# Patient Record
Sex: Female | Born: 1975 | Race: Black or African American | Hispanic: No | Marital: Married | Smoking: Never smoker
Health system: Southern US, Community
[De-identification: ages and names within clinical notes are randomized; demographics above are authoritative.]

## PROBLEM LIST (undated history)

## (undated) DIAGNOSIS — G43909 Migraine, unspecified, not intractable, without status migrainosus: Secondary | ICD-10-CM

## (undated) DIAGNOSIS — I10 Essential (primary) hypertension: Secondary | ICD-10-CM

## (undated) DIAGNOSIS — M25511 Pain in right shoulder: Secondary | ICD-10-CM

## (undated) DIAGNOSIS — F419 Anxiety disorder, unspecified: Secondary | ICD-10-CM

## (undated) DIAGNOSIS — T7840XA Allergy, unspecified, initial encounter: Secondary | ICD-10-CM

## (undated) DIAGNOSIS — D509 Iron deficiency anemia, unspecified: Secondary | ICD-10-CM

## (undated) DIAGNOSIS — Z5189 Encounter for other specified aftercare: Secondary | ICD-10-CM

## (undated) DIAGNOSIS — G8929 Other chronic pain: Secondary | ICD-10-CM

## (undated) DIAGNOSIS — Z8669 Personal history of other diseases of the nervous system and sense organs: Secondary | ICD-10-CM

## (undated) HISTORY — DX: Essential (primary) hypertension: I10

## (undated) HISTORY — DX: Allergy, unspecified, initial encounter: T78.40XA

## (undated) HISTORY — DX: Encounter for other specified aftercare: Z51.89

---

## 1998-08-31 ENCOUNTER — Emergency Department (HOSPITAL_COMMUNITY): Admission: EM | Admit: 1998-08-31 | Discharge: 1998-08-31 | Payer: Self-pay | Admitting: Emergency Medicine

## 1999-01-26 ENCOUNTER — Ambulatory Visit (HOSPITAL_COMMUNITY): Admission: RE | Admit: 1999-01-26 | Discharge: 1999-01-26 | Payer: Self-pay | Admitting: Obstetrics

## 1999-08-17 ENCOUNTER — Encounter (HOSPITAL_COMMUNITY): Admission: RE | Admit: 1999-08-17 | Discharge: 1999-08-27 | Payer: Self-pay | Admitting: Obstetrics

## 1999-08-26 ENCOUNTER — Inpatient Hospital Stay (HOSPITAL_COMMUNITY): Admission: AD | Admit: 1999-08-26 | Discharge: 1999-08-29 | Payer: Self-pay | Admitting: Obstetrics

## 1999-08-26 ENCOUNTER — Encounter: Payer: Self-pay | Admitting: Obstetrics

## 1999-08-26 ENCOUNTER — Encounter (INDEPENDENT_AMBULATORY_CARE_PROVIDER_SITE_OTHER): Payer: Self-pay

## 1999-08-29 ENCOUNTER — Encounter (HOSPITAL_COMMUNITY): Admission: RE | Admit: 1999-08-29 | Discharge: 1999-11-27 | Payer: Self-pay | Admitting: Obstetrics

## 1999-11-29 ENCOUNTER — Encounter (HOSPITAL_COMMUNITY): Admission: RE | Admit: 1999-11-29 | Discharge: 2000-02-22 | Payer: Self-pay | Admitting: Obstetrics

## 2000-08-03 ENCOUNTER — Other Ambulatory Visit: Admission: RE | Admit: 2000-08-03 | Discharge: 2000-08-03 | Payer: Self-pay | Admitting: Obstetrics

## 2000-12-06 HISTORY — PX: TUBAL LIGATION: SHX77

## 2001-01-27 ENCOUNTER — Encounter: Payer: Self-pay | Admitting: Obstetrics

## 2001-01-27 ENCOUNTER — Ambulatory Visit (HOSPITAL_COMMUNITY): Admission: RE | Admit: 2001-01-27 | Discharge: 2001-01-27 | Payer: Self-pay | Admitting: Obstetrics

## 2001-01-30 ENCOUNTER — Encounter (HOSPITAL_COMMUNITY): Admission: RE | Admit: 2001-01-30 | Discharge: 2001-02-14 | Payer: Self-pay | Admitting: Obstetrics

## 2001-02-13 ENCOUNTER — Encounter (INDEPENDENT_AMBULATORY_CARE_PROVIDER_SITE_OTHER): Payer: Self-pay

## 2001-02-13 ENCOUNTER — Encounter (INDEPENDENT_AMBULATORY_CARE_PROVIDER_SITE_OTHER): Payer: Self-pay | Admitting: Specialist

## 2001-02-13 ENCOUNTER — Inpatient Hospital Stay (HOSPITAL_COMMUNITY): Admission: AD | Admit: 2001-02-13 | Discharge: 2001-02-15 | Payer: Self-pay | Admitting: Obstetrics

## 2002-10-22 ENCOUNTER — Encounter: Payer: Self-pay | Admitting: Emergency Medicine

## 2002-10-22 ENCOUNTER — Emergency Department (HOSPITAL_COMMUNITY): Admission: EM | Admit: 2002-10-22 | Discharge: 2002-10-23 | Payer: Self-pay | Admitting: Emergency Medicine

## 2003-12-07 HISTORY — PX: GASTRIC OUTLET OBSTRUCTION RELEASE: SHX5247

## 2003-12-07 HISTORY — PX: GASTRIC BYPASS: SHX52

## 2004-06-29 ENCOUNTER — Ambulatory Visit (HOSPITAL_COMMUNITY): Admission: RE | Admit: 2004-06-29 | Discharge: 2004-06-29 | Payer: Self-pay | Admitting: *Deleted

## 2004-07-03 ENCOUNTER — Ambulatory Visit (HOSPITAL_COMMUNITY): Admission: RE | Admit: 2004-07-03 | Discharge: 2004-07-03 | Payer: Self-pay | Admitting: *Deleted

## 2004-07-06 ENCOUNTER — Encounter: Admission: RE | Admit: 2004-07-06 | Discharge: 2004-07-06 | Payer: Self-pay | Admitting: *Deleted

## 2004-07-14 ENCOUNTER — Encounter: Admission: RE | Admit: 2004-07-14 | Discharge: 2004-10-12 | Payer: Self-pay | Admitting: *Deleted

## 2004-09-08 ENCOUNTER — Inpatient Hospital Stay (HOSPITAL_COMMUNITY): Admission: RE | Admit: 2004-09-08 | Discharge: 2004-09-13 | Payer: Self-pay | Admitting: *Deleted

## 2004-11-04 ENCOUNTER — Encounter: Admission: RE | Admit: 2004-11-04 | Discharge: 2004-11-04 | Payer: Self-pay | Admitting: *Deleted

## 2005-02-12 ENCOUNTER — Encounter: Admission: RE | Admit: 2005-02-12 | Discharge: 2005-05-13 | Payer: Self-pay | Admitting: *Deleted

## 2005-08-30 IMAGING — CR DG ABDOMEN ACUTE W/ 1V CHEST
5 series · 5 of 5 positions shown · non-contrast
Comparison: 09/09/04.

CLINICAL DATA: 28-year-old female, morbid obesity with nausea and vomiting.  Status-post gastric bypass.  
 ACUTE ABDOMINAL SERIES, 09/10/04

[view not recorded (1 of 5)]
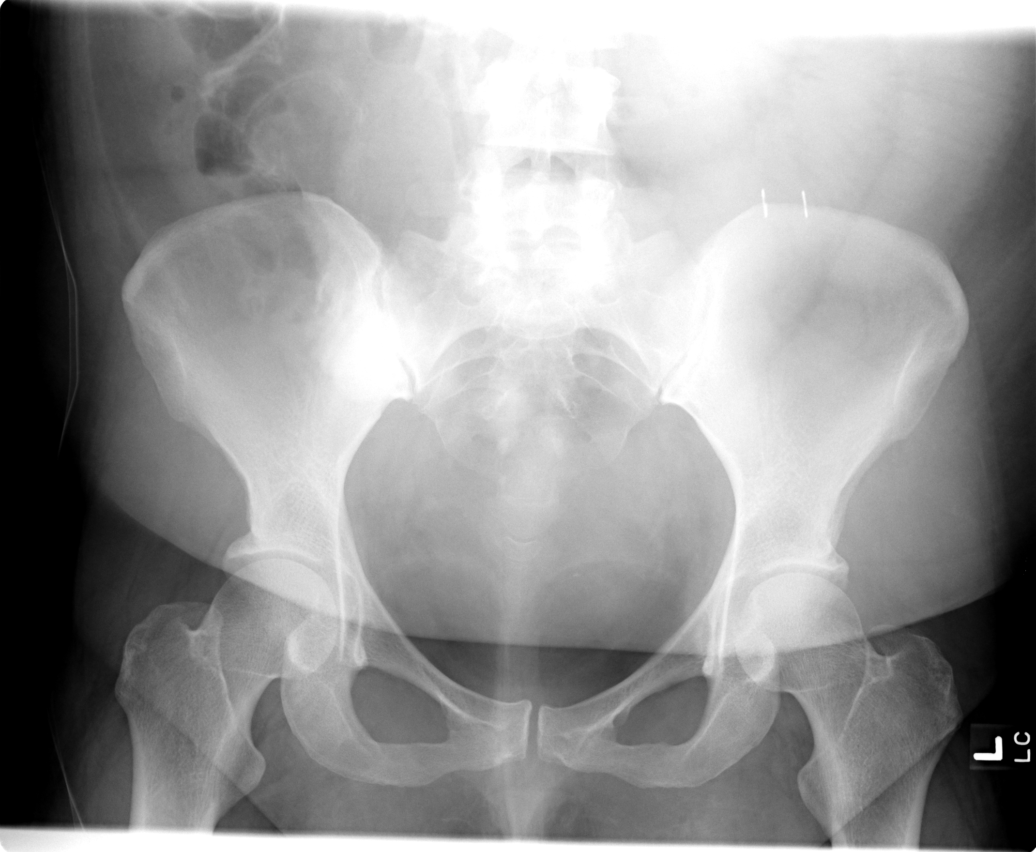

[view not recorded (2 of 5)]
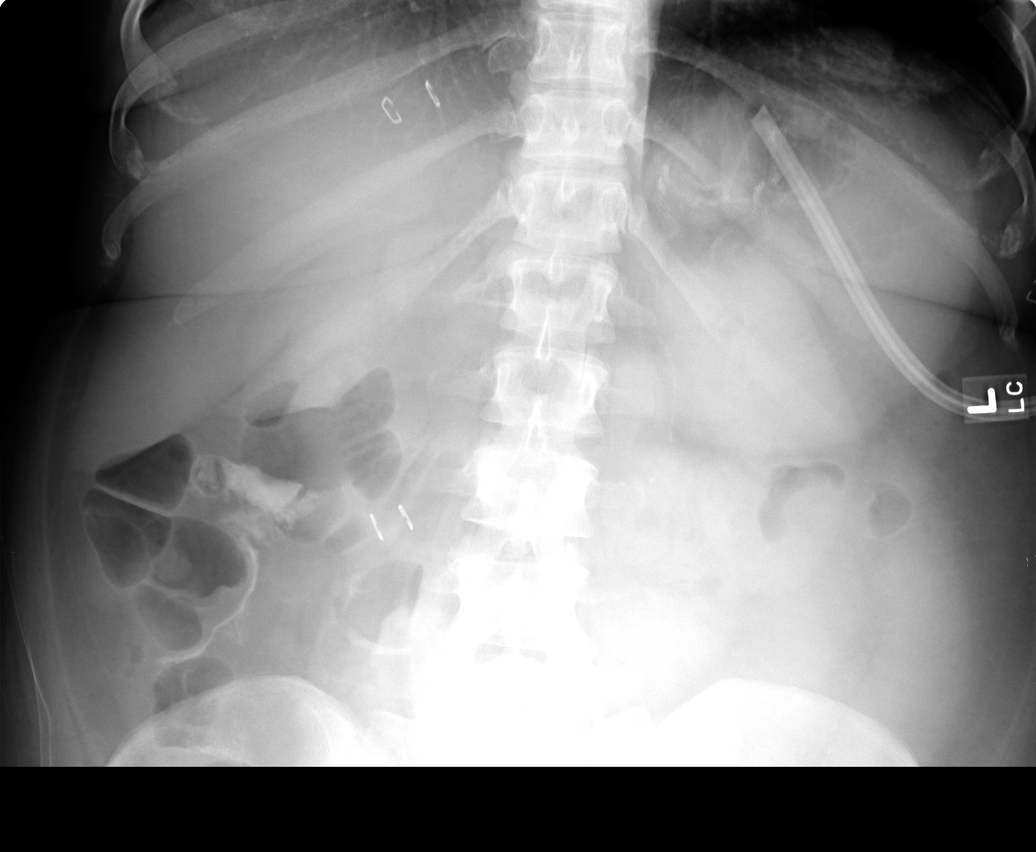

[view not recorded (3 of 5)]
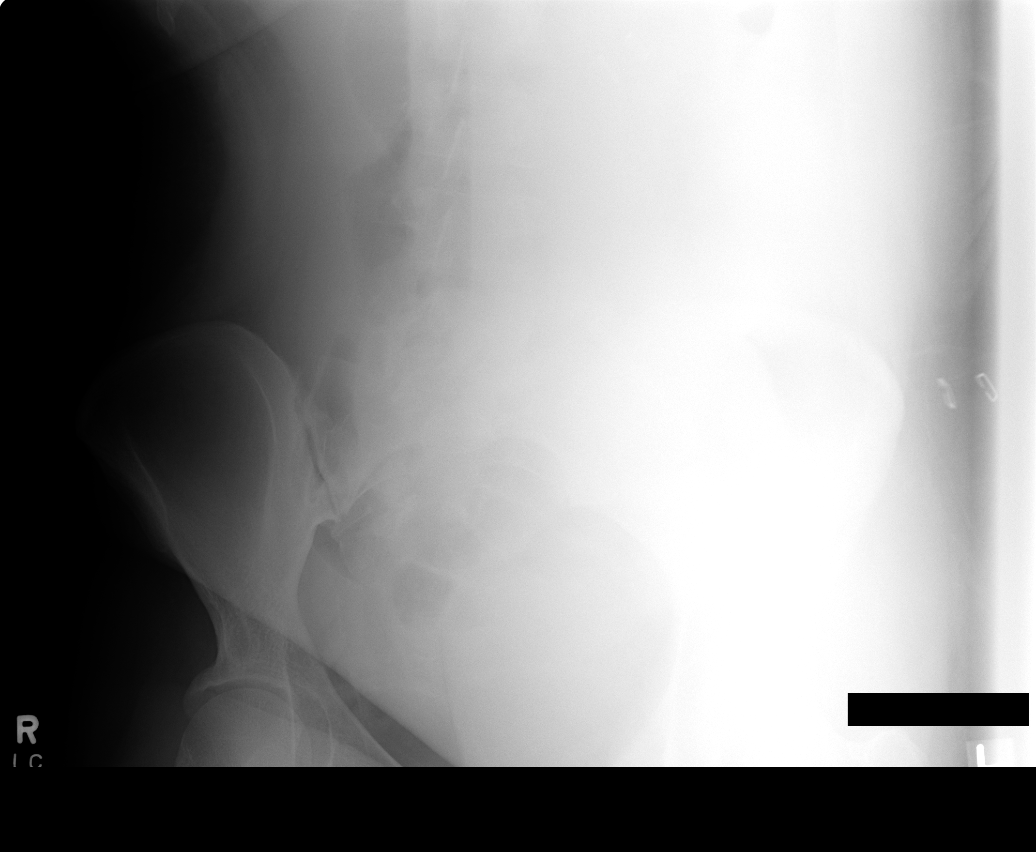

[view not recorded (4 of 5)]
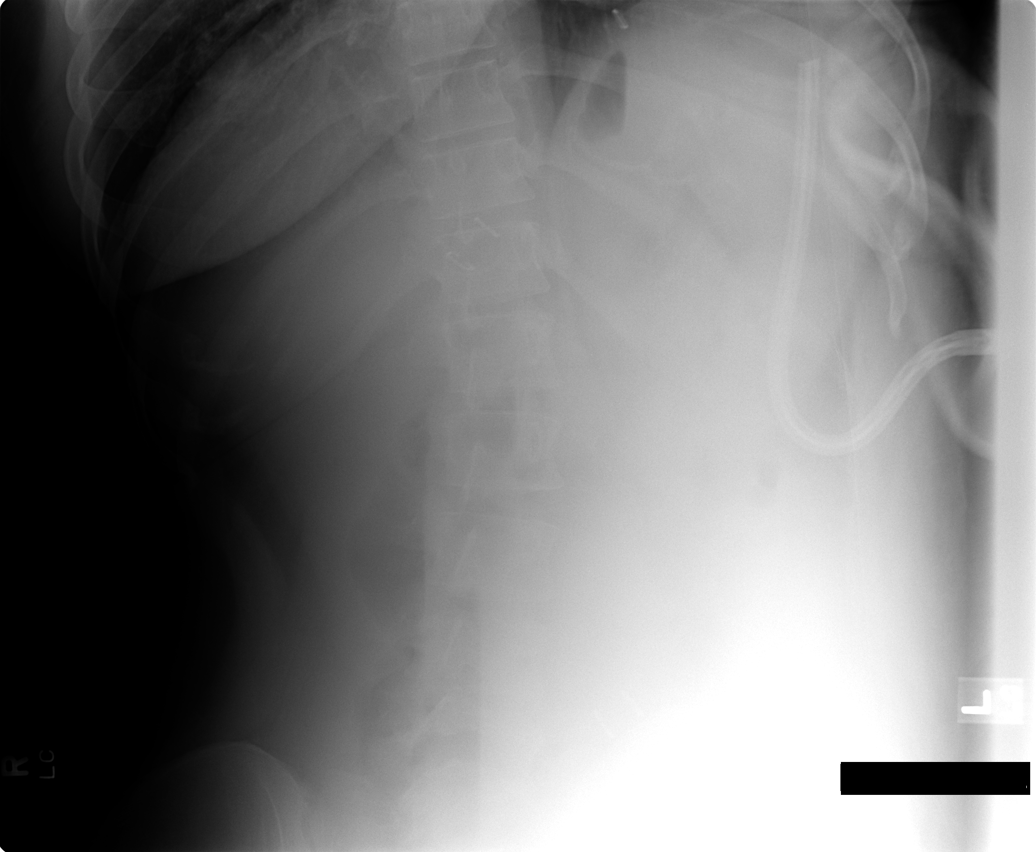

[view not recorded (5 of 5)]
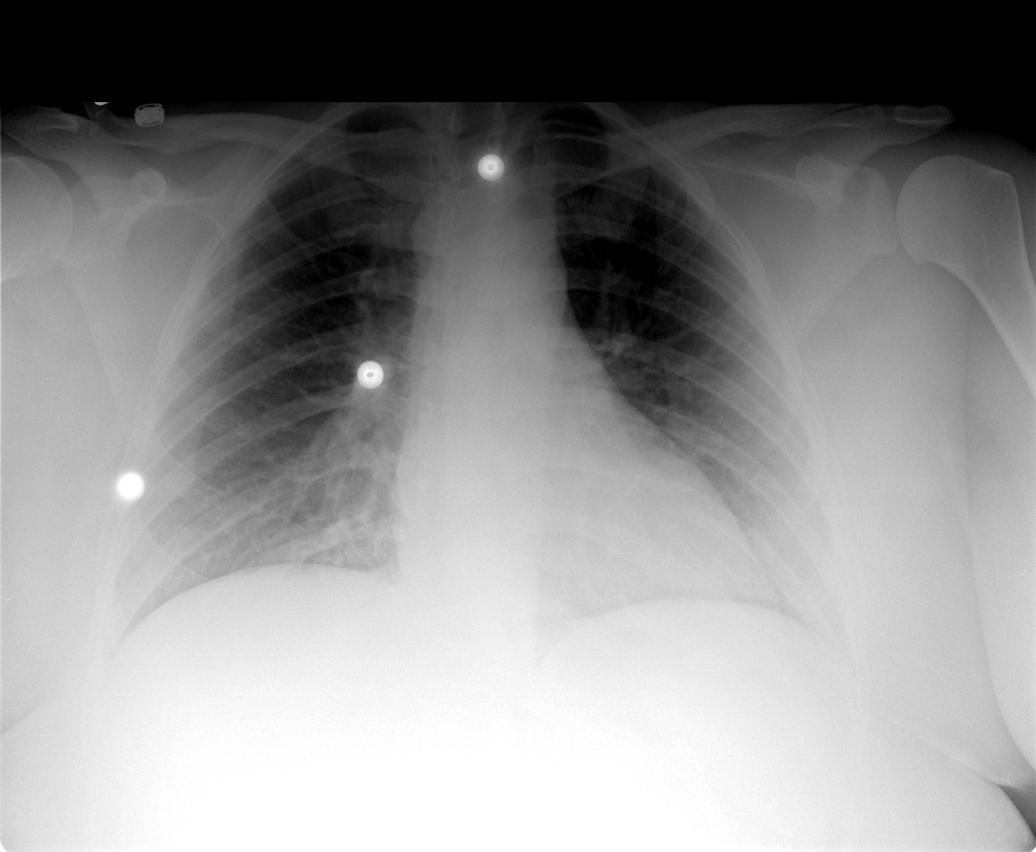

[5 of 5 positions shown; findings below may reference images not displayed]

FINDINGS: Lungs are clear.  No acute pneumonia, atelectasis, consolidation, effusion, or pneumothorax.  Heart size is normal.  No free air.  Surgical drain is evident in the left upper quadrant.  Postoperative change is evident in the left upper quadrant.  Staples are noted at the laparoscopy ports.  Scattered air and fluid is evident throughout the bowel.  On the decubitus view, there are a few scattered air-fluid levels.  Findings suggest residual ileus.  
 IMPRESSION
 No active chest disease. 
 Postoperative changes from recent gastric bypass. 
 Fluid distended small bowel with air-fluid levels.  Air and contrast are evident in the colon.  Findings suggest residual ileus.

## 2008-06-21 ENCOUNTER — Emergency Department (HOSPITAL_COMMUNITY): Admission: EM | Admit: 2008-06-21 | Discharge: 2008-06-21 | Payer: Self-pay | Admitting: Emergency Medicine

## 2008-08-09 ENCOUNTER — Emergency Department (HOSPITAL_COMMUNITY): Admission: EM | Admit: 2008-08-09 | Discharge: 2008-08-09 | Payer: Self-pay | Admitting: Emergency Medicine

## 2011-04-23 NOTE — Op Note (Signed)
[facility]  Patient:    Tammy [name], Tammy [name]                     MRN: [id number] Proc. Date: 02/14/01 Adm. Date:  [id number] Disc. Date: [id number] Attending:  Venita Sheffield                           Operative Report  PREOPERATIVE DIAGNOSIS:       Multiparity, desires sterilization.  PROCEDURE:                    Postpartum tubal.  SURGEON:                      Kathreen Cosier, M.D.  DESCRIPTION OF PROCEDURE:     Using epidural and with the patient in the supine position, the abdomen was prepped and draped.  The bladder was emptied with a straight catheter.  A midline subumbilical incision 1 inc h long was made and carried down to the fascia.  The fascia was cleaned and grasped with two Kochers.  The fascia and the peritoneum was opened with Mayo scissors. The left tube was grasped in the mid portion with a Babcock clamp and the tube traced to the fimbria.  Suture of 0 plain was placed in the mesosalpinx.  The resultant portion of tube within the clamp was tied on both sides and approximately 1 inch of tube transected.  Hemostasis was satisfactory.  The procedure was repeated in a similar fashion on the other side.  Lap and sponge counts were correct.  The abdomen was closed in layers, the peritoneum and fascia with continuous 2-0 Dexon.  The skin was closed with subcuticular sutures of 3-0 plain.  The patient tolerated the procedure well and returned to the recovery room in good condition. DD:  02/14/01 TD:  02/15/01 Job: [id number]

## 2011-04-23 NOTE — Discharge Summary (Signed)
[facility]  Patient:    Tammy [name], Tammy [name]                     MRN: [id number] Adm. Date:  02/13/01 Attending:  Kathreen Cosier, M.D.                           Discharge Summary  HISTORY OF PRESENT ILLNESS:   Patient is a 35 year old gravida 5, para 3-1-0-4, [facility] February 20, 2001, positive GBS patient, had nonstress test from 36 weeks twice a week because of chronic hypertension.  On the day of admission, her diastolics were between 93 and 108.  Her blood pressure was under good control with Aldomet 500 t.i.d.  She was brought in for induction of labor.  HOSPITAL COURSE:              At the time of admission, her cervix 3 cm, 70%, vertex, and -3.  She had a normal vaginal delivery of a female, Apgars 8 and 9, weighing 6 pounds 11 ounces.  Patient desired sterilization and underwent postpartum tubal ligation on February 14, 2001.  On admission, her hemoglobin was 12.9, platelets 370; post delivery 12.6 and 3.5.  Sodium 136, potassium 3.9, and chloride 110.  Her PIH labs were normal.  Urine protein was negative. Patient was discharged home on the second postpartum day and will return to regular diet, on Aldomet 500 three times a day, Tylenol No. 3 one q.4h. p.r.n. She is to see Dr. Allyne Gee in one week about her blood pressure control and medication.  DISCHARGE DIAGNOSES:          1. Induction of labor at 39 weeks because of                                  chronic hypertension.                               2. Postpartum tubal ligation performed. DD:  02/15/01 TD:  02/15/01 Job: [id number]

## 2011-04-23 NOTE — Op Note (Signed)
NAMELYNZEE, LINDQUIST            ACCOUNT NO.:  [id number]   MEDICAL RECORD NO.:  [id number]          PATIENT TYPE:  INP   LOCATION:  0474                         FACILITY:  [facility]   PHYSICIAN:  Sandria Bales. Ezzard Standing, M.D.  DATE OF BIRTH:  1976/09/03   DATE OF PROCEDURE:  DATE OF DISCHARGE:                                 OPERATIVE REPORT   PROCEDURE:  Upper endoscopy with decompression of small bowel.   SURGEON:  Sandria Bales. Ezzard Standing, M.D.   ANESTHESIA:  General endotracheal.   ESTIMATED BLOOD LOSS:  None.   INDICATIONS FOR PROCEDURE:  Ms. Zumstein is a 35 year old female who is two  days status post laparoscopic Roux-en-Y gastrojejunostomy.  She has  developed vomiting.  A CT scan today was suggestive of a small bowel  obstruction at her jejunal anastomosis.   I am endoscoping the patient, trying to decompress her gastric pouch while  Dr. Lind Guest the laparoscope, and we will both occlude the small bowel  and feed the small bowel contents to me.   Operative Note:  After passing the endoscope without difficulty down her esophagus, again,  getting to the GE junction at about 38-40 cm into the stomach pouch.  I did  not want to insufflate much air, although the stomach mucosa looked viable  and pink, though mildly hemorrhagic.  I decompressed about 550 cc of succus  entericus out as Dr. Daphine Deutscher fed the fluid back to me.  I then aspirated the  esophagus, which looked okay.   The patient tolerated the procedure well.  Dr. Daphine Deutscher will dictate the  laparoscopic portion of the operation.     Davi   DHN/MEDQ  D:  09/10/2004  T:  09/10/2004  Job:  [id number]   cc:   Vikki Ports, MD  [address]  [place]  [place] [address]  Fax: [phone]

## 2011-04-23 NOTE — Op Note (Signed)
[name], [name]            ACCOUNT NO.:  [id number]   MEDICAL RECORD NO.:  [id number]          PATIENT TYPE:  INP   LOCATION:  0001                         FACILITY:  [facility]   PHYSICIAN:  Vikki Ports, M.D.DATE OF BIRTH:  1976/01/25   DATE OF PROCEDURE:  09/08/2004  DATE OF DISCHARGE:                                 OPERATIVE REPORT   PREOPERATIVE DIAGNOSES:  Morbid obesity.   POSTOPERATIVE DIAGNOSES:  Morbid obesity.   PROCEDURE:  Laparoscopic roux-en-Y gastric bypass, antecolic, antegastric.   SURGEON:  Vikki Ports, M.D.   ASSISTANT:  Sandria Bales. Ezzard Standing, M.D.   ANESTHESIA:  General.   DESCRIPTION OF PROCEDURE:  The patient was taken to the operating room,  placed in the supine position.  After adequate general anesthesia was  induced using endotracheal tube and Foley catheter was placed, the abdomen  was prepped and draped in the normal sterile fashion. Using a 12 mm  Optiview, pneumoperitoneum was obtained through the left upper quadrant.  Additional 12 mm trocars were placed in the right upper quadrant in the left  paramedian position. A 5 mm trocar was placed in the left anterior axillary  line. The ligament of Treitz was identified and the small bowel was divided  using an auto suture GIA stapling device 40 cm distal to the ligament of  Treitz. The distal end was tagged with a Penrose drain and counting down an  additional 100 cm, a side to side jejunojejunostomy was performed in the  standard fashion using a harmonic scalpel and a 45 mm white load GIA  stapling device. The defect was closed with running 2-0 Vicryl suture and  secured with Tisseel. The mesenteric defect was closed with a running 2-0  silk suture. The [facility] liver retractor was then placed and the left  lateral segment of the liver was retracted. The angle of His was sharply and  bluntly dissected. An area on the lesser curve approximately 4 cm distal to  the EG junction was  identified and the lesser sac was entered.  Using four  consecutive firings of a blue load GIA stapling device, the pouch was  created. The patency of the EG junction was verified by placement of an  Ewald tube. After complete division of the stomach, the staple line of the  remnant was oversewn with a running 2-0 silk suture. A side to side  gastrojejunostomy to the roux limb was then performed in a standard fashion  using a running posterior 2-0 Vicryl. A 45 mm GIA stapling device. The  defect was closed with a running 2-0 Vicryl suture and anterior serosal  layer was also performed. This was accomplished over an Ewald tube. The  Ewald tube was then removed, roux limb was clamped, upper endoscopy was  performed by Dr. Ovidio Kin which showed patency of the anastomosis and no  evidence of leak.  A drain was placed near the EG junction, pneumoperitoneum  was released, trocars were removed, retractor was removed, incisions were  closed with staples. Sterile dressings were applied. The patient tolerated  the procedure well and went to PACU in good  condition.    KRH/MEDQ  D:  09/08/2004  T:  09/08/2004  Job:  [id number]

## 2011-04-23 NOTE — Discharge Summary (Signed)
[name], Tammy [name]            ACCOUNT NO.:  [id number]   MEDICAL RECORD NO.:  [id number]          PATIENT TYPE:  INP   LOCATION:  0472                         FACILITY:  [facility]   PHYSICIAN:  Vikki Ports, MDDATE OF BIRTH:  [birth year]   DATE OF ADMISSION:  09/08/2004  DATE OF DISCHARGE:  09/13/2004                                 DISCHARGE SUMMARY   ADMISSION DIAGNOSIS:  Morbid obesity.   DISCHARGE DIAGNOSIS:  Morbid obesity.   CONDITION ON DISCHARGE:  Good and improved.   DISPOSITION:  Discharge to home.  Follow up with me in one week.   BRIEF HISTORY:  Patient is a 35 year old black female who after home bowel  prep is admitted for laparoscopic Roux-en-Y gastric bypass.   HOSPITAL COURSE:  Patient was admitted.  Underwent laparoscopic Roux-en-Y  gastric bypass.  She was watched on the floor without complaints.  On  postoperative day #1, the upper GI showed some dilatation of the Roux limb.   On postoperative day #2, patient was having some significant nausea and was  followed; however, later in the day, she had a CT scan because of the  air/fluid levels on her upper GI, and this showed a distended duodenum and  gastric remnant.  Patient was taken to the operating room that evening by  dr. Daphine Deutscher and Dr. Ezzard Standing and had laparoscopic decompression of the small  bowel and jejunojunopexy.   Over the next two days, her diet was slowly advanced.  By postoperative day  #5, she was tolerating protein liquids and was discharged home.     Tammy [name]   KRH/MEDQ  D:  10/02/2004  T:  10/02/2004  Job:  [id number]

## 2011-04-23 NOTE — Op Note (Signed)
NAMEGLEMA, TAKAKI            ACCOUNT NO.:  [id number]   MEDICAL RECORD NO.:  [id number]          PATIENT TYPE:  INP   LOCATION:  0474                         FACILITY:  [facility]   PHYSICIAN:  Thornton Park. Daphine Deutscher, M.D.DATE OF BIRTH:  May 01, 1976   DATE OF PROCEDURE:  09/10/2004  DATE OF DISCHARGE:                                 OPERATIVE REPORT   PREOPERATIVE DIAGNOSIS:  Mechanical small bowel obstruction with persistent  nausea and vomiting.   POSTOPERATIVE DIAGNOSIS:  Angulation of the jejunojejunostomy with partial  small bowel obstruction.   PROCEDURE:  Laparoscopic decompression of bowel obstruction and  jejunojejunopexy with upper endoscopy by Dr. Ezzard Standing.   SURGEON:  Thornton Park. Daphine Deutscher, M.D.   ASSISTANT:  Sandria Bales. Ezzard Standing, M.D.   ANESTHESIA:  General endotracheal anesthesia.   The previously placed Jackson-Pratt was left in place.   DESCRIPTION OF PROCEDURE:  Tammy [name] is a 35 year old female who  underwent a laparoscopic Roux-en-Y gastric bypass on September 08, 2004.  Approximately 18 hours ago, she had some nausea and vomiting treated with  antiemetics.  Her x-rays showed some evidence of possibly an ileus, and a CT  scan today showed some dilated loops of small bowel proximally, including  the gastric remnant and the Roux distended.  We felt that urgent laparoscopy  was in order and brought her to the operating room.  I discussed this with  her preop.   She was endotracheally intubated, and the abdomen was prepped with Betadine  and draped sterilely.  Her staples were removed.  I prepped her J-P out and  left it in place.  I then opened two of the trocar holes with my finger and  guided into the abdomen with a Optiview technique, I used the pre-discovered  holes and entered the abdomen without difficulty and insufflated the  abdomen.  Another trocar was placed, then a fresh 1011 was placed in the  right lower quadrant.  With this insufflated, we went up and  looked in the  left upper quadrant and found the jejunojejunostomy had markedly dilated  biliopancreatic limb and the Roux-en-Y limb.  Distally, the bowel was  decompressed.  By manipulating this and shifting the Roux-en-Y limb in a  cephalad position, it immediately began decompressing into the small bowel  distally.  It appeared that there was angulation there.  We then spent time  and allowed this to decompress distally, then Dr. Ezzard Standing went up with the  endoscope, and I gently allowed contents to flow in a retrograde fashion.  I  got about 450-500 cc of greenish fluid.   We studied the geometry of this anastomosis and looked at possible distal  suturing but felt that a proximal pexy of the jejunum to the biliopancreatic  limb would straighten out the angulation that was occurring with the Roux  limb twisting downward; therefore, I went back above the stapled  anastomosis, at 2-3 cm, and got a good purchase of the antimesenteric border  of the Roux-en-Y limb and sutured that upstream to a portion of the  biliopancreatic limb and tied this down.  This seemed to reduce the  angulation of the anastomosis.  The abdomen was aspirated.  There was serous  fluid of an ascitic-type nature.  The bowel remained looking very viable.  There was no evidence of any perforation, and we did not feel that we had  any kind of untoward bowel injury, as we were very carefully  manipulating the bowel.  We tried to videotape this, but the OR staff was  unable to get the video to work.  We did take some still pictures.  Patient  tolerated the procedure well.  She will be taken to the recovery room and  then go to the intensive care unit for observation in the immediate postop  period.      MBM/MEDQ  D:  09/10/2004  T:  09/10/2004  Job:  [id number]   cc:   Vikki Ports, M.D.  [address]  [place]  [place] [address]  Fax: [phone]

## 2011-04-23 NOTE — Op Note (Signed)
Tammy [name], BORUNDA            ACCOUNT NO.:  [id number]   MEDICAL RECORD NO.:  [id number]          PATIENT TYPE:  INP   LOCATION:  0001                         FACILITY:  [facility]   PHYSICIAN:  Sandria Bales. Ezzard Standing, M.D.  DATE OF BIRTH:  1976/09/15   DATE OF PROCEDURE:  09/08/2004  DATE OF DISCHARGE:                                 OPERATIVE REPORT   PREOPERATIVE DIAGNOSIS:  Morbid obesity status post laparoscopic Roux-Y  gastric gastrojejunostomy.   POSTOPERATIVE DIAGNOSIS:  Morbid obesity status post laparoscopic Roux-Y  gastric gastrojejunostomy with patent anastomosis and no evidence of leak.   PROCEDURE:  Esophagogastroscopy.   SURGEON:  Sandria Bales. Ezzard Standing, M.D.   ANESTHESIA:  General.   INDICATIONS FOR PROCEDURE:  Ms. Daughtrey is a 35 year old white female who  is morbidly obese, with a BMI of approximately 41.  She has undergone a  laparoscopic Roux-Y gastrojejunostomy by Dr. Danna Hefty.  I am now  doing an upper endoscopy for the purposes of identifying the anastomosis and  to make sure it is patent and without any leak and no evidence of bleeding.   DESCRIPTION OF PROCEDURE:  The patient was placed in a supine position under  general anesthesia.  Dr. Luan Pulling was manning the laparoscope and was  flooding the upper abdomen, placing the anastomosis under water.  I passed  the flexible Olympus endoscope without difficulty down to the GE junction.  I identified the anastomosis at approximately 45 cm.  It was widely patent.  I insufflated the stomach while she had the stomach under water, and there  was no air bubbling or evidence of leak.  There was no evidence of bleeding  from the staple line.  The stomach pouch otherwise looked good.  The  esophagogastric junction was at approximately 38 cm with a 7-cm pouch.  The  esophagus was otherwise unremarkable.   The patient tolerated the procedure well.  I then withdrew the scope.  Dr.  Luan Pulling will dictate the  laparoscopic Roux-Y gastrojejunostomy.     Davi   DHN/MEDQ  D:  09/08/2004  T:  09/08/2004  Job:  [id number]   cc:   Vikki Ports, MD  [address]  [place]  [place] [address]  Fax: [phone]

## 2011-09-08 LAB — STREP A DNA PROBE: Group A Strep Probe: NEGATIVE

## 2011-09-08 LAB — RAPID STREP SCREEN (MED CTR MEBANE ONLY): Streptococcus, Group A Screen (Direct): NEGATIVE

## 2012-07-23 ENCOUNTER — Ambulatory Visit (INDEPENDENT_AMBULATORY_CARE_PROVIDER_SITE_OTHER): Payer: Federal, State, Local not specified - PPO | Admitting: Internal Medicine

## 2012-07-23 VITALS — BP 142/80 | HR 88 | Temp 98.4°F | Resp 16 | Ht 65.5 in | Wt 153.0 lb

## 2012-07-23 DIAGNOSIS — R209 Unspecified disturbances of skin sensation: Secondary | ICD-10-CM

## 2012-07-23 DIAGNOSIS — M519 Unspecified thoracic, thoracolumbar and lumbosacral intervertebral disc disorder: Secondary | ICD-10-CM

## 2012-07-23 DIAGNOSIS — G4700 Insomnia, unspecified: Secondary | ICD-10-CM

## 2012-07-23 DIAGNOSIS — M5136 Other intervertebral disc degeneration, lumbar region: Secondary | ICD-10-CM | POA: Insufficient documentation

## 2012-07-23 DIAGNOSIS — M5137 Other intervertebral disc degeneration, lumbosacral region: Secondary | ICD-10-CM | POA: Insufficient documentation

## 2012-07-23 DIAGNOSIS — M51379 Other intervertebral disc degeneration, lumbosacral region without mention of lumbar back pain or lower extremity pain: Secondary | ICD-10-CM | POA: Insufficient documentation

## 2012-07-23 DIAGNOSIS — Z9884 Bariatric surgery status: Secondary | ICD-10-CM | POA: Insufficient documentation

## 2012-07-23 DIAGNOSIS — R202 Paresthesia of skin: Secondary | ICD-10-CM

## 2012-07-23 MED ORDER — PREDNISONE 20 MG PO TABS: ORAL_TABLET | ORAL | Status: DC

## 2012-07-23 MED ORDER — CYCLOBENZAPRINE HCL 10 MG PO TABS: 10.0000 mg | ORAL_TABLET | Freq: Every day | ORAL | Status: DC

## 2012-07-23 MED ORDER — ALPRAZOLAM 0.5 MG PO TABS: ORAL_TABLET | ORAL | Status: DC

## 2012-07-23 MED ORDER — DICLOFENAC SODIUM 75 MG PO TBEC: 75.0000 mg | DELAYED_RELEASE_TABLET | Freq: Two times a day (BID) | ORAL | Status: DC

## 2012-07-23 NOTE — Progress Notes (Signed)
  Subjective:    Patient ID: Tammy [name], female    DOB: 02/23/1976, 36 y.o.   MRN: [id number]  HPI Back painFor the past 2 weeks-low lumbar with radiation to the right hip and numbness on the sole of the right foot She has a history of MRI2006 proven disc disease at L5-S1 but this has been an active for the last few years/last treated here in 2009 Back trouble exacerbated this time by long drive to [place] for her grandmothers funeral Stress Level high due to family events surrounding his death/she's having trouble sleeping again/past history of depression and anxiety and was on SSRIs at 1 point is been off medicines for more than 2 years No depression  Work-massage/doing well 5 children/45 year old daughter recently returned home to live 2 other teenage daughters 1 year old son and 31 year old son Significant stress organizing  her family  Past medical history-status post intestinal bypass for obesity 2005-successful Hypertension diagnosed in the 90s but resolved after gastric bypass Anemia secondary to menorrhagia has been stable Review of Systems No fever No genitourinary problems No gastrointestinal issues    Objective:   Physical Exam No acute distress Vital signs stable Tender over the right lumbosacral area to deep palpation straight leg raise positive at 90 Decreased sensation over the sole of the right foot Deep tendon reflexes are symmetrical patellar and ankle Decreased range of motion of the lumbar area secondary to pain No muscle atrophy in the legs      Assessment & Plan:  Problem #1 degenerative disc disease lumbar Problem #2 acute back pain Problem #3 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 ALPRAZolam (XANAX) 0.5 MG tablet    Sig: Half to one every 8 hrs as needed for anxiety    Dispense:  90 tablet    Refill:  0  . predniSONE (DELTASONE) 20 MG tablet   Sig: 3/3/2/2/1/1 single daily dose for 6 days    Dispense:  12 tablet    Refill:  0  . cyclobenzaprine (FLEXERIL) 10 MG tablet    Sig: Take 1 tablet (10 mg total) by mouth at bedtime.    Dispense:  30 tablet    Refill:  0  . diclofenac (VOLTAREN) 75 MG EC tablet    Sig: Take 1 tablet (75 mg total) by mouth 2 (two) times daily.    Dispense:  60 tablet    Refill:  0   Recheck in 2 weeks if not stable

## 2012-08-17 ENCOUNTER — Other Ambulatory Visit: Payer: Self-pay | Admitting: Internal Medicine

## 2012-08-17 NOTE — Telephone Encounter (Signed)
OK X 1, NEEDS RECHECK FOR MORE

## 2012-08-18 ENCOUNTER — Other Ambulatory Visit: Payer: Self-pay | Admitting: *Deleted

## 2012-08-31 ENCOUNTER — Ambulatory Visit (INDEPENDENT_AMBULATORY_CARE_PROVIDER_SITE_OTHER): Payer: Federal, State, Local not specified - PPO | Admitting: Family Medicine

## 2012-08-31 ENCOUNTER — Ambulatory Visit: Payer: Federal, State, Local not specified - PPO

## 2012-08-31 ENCOUNTER — Inpatient Hospital Stay (HOSPITAL_COMMUNITY)
Admission: EM | Admit: 2012-08-31 | Discharge: 2012-09-01 | DRG: 395 | Disposition: A | Discharge status: Home / Self Care | Payer: Federal, State, Local not specified - PPO | Attending: Emergency Medicine | Admitting: Emergency Medicine

## 2012-08-31 ENCOUNTER — Encounter (HOSPITAL_COMMUNITY): Payer: Self-pay | Admitting: Emergency Medicine

## 2012-08-31 VITALS — BP 128/78 | HR 101 | Temp 98.3°F | Resp 18 | Ht 65.5 in | Wt 153.0 lb

## 2012-08-31 DIAGNOSIS — Z9884 Bariatric surgery status: Secondary | ICD-10-CM

## 2012-08-31 DIAGNOSIS — K5289 Other specified noninfective gastroenteritis and colitis: Secondary | ICD-10-CM

## 2012-08-31 DIAGNOSIS — N920 Excessive and frequent menstruation with regular cycle: Secondary | ICD-10-CM | POA: Diagnosis present

## 2012-08-31 DIAGNOSIS — Z79899 Other long term (current) drug therapy: Secondary | ICD-10-CM

## 2012-08-31 DIAGNOSIS — M25539 Pain in unspecified wrist: Secondary | ICD-10-CM | POA: Diagnosis present

## 2012-08-31 DIAGNOSIS — R112 Nausea with vomiting, unspecified: Secondary | ICD-10-CM

## 2012-08-31 DIAGNOSIS — M25532 Pain in left wrist: Secondary | ICD-10-CM

## 2012-08-31 DIAGNOSIS — Z9851 Tubal ligation status: Secondary | ICD-10-CM

## 2012-08-31 DIAGNOSIS — K529 Noninfective gastroenteritis and colitis, unspecified: Secondary | ICD-10-CM

## 2012-08-31 DIAGNOSIS — D508 Other iron deficiency anemias: Secondary | ICD-10-CM

## 2012-08-31 DIAGNOSIS — R197 Diarrhea, unspecified: Secondary | ICD-10-CM | POA: Diagnosis present

## 2012-08-31 DIAGNOSIS — I10 Essential (primary) hypertension: Secondary | ICD-10-CM | POA: Diagnosis present

## 2012-08-31 DIAGNOSIS — D649 Anemia, unspecified: Secondary | ICD-10-CM

## 2012-08-31 DIAGNOSIS — D509 Iron deficiency anemia, unspecified: Principal | ICD-10-CM | POA: Diagnosis present

## 2012-08-31 LAB — CBC WITH DIFFERENTIAL/PLATELET
Basophils Absolute: 0 10*3/uL (ref 0.0–0.1)
Basophils Relative: 0 % (ref 0–1)
Eosinophils Absolute: 0.1 10*3/uL (ref 0.0–0.7)
Eosinophils Relative: 1 % (ref 0–5)
HCT: 15.6 % — ABNORMAL LOW (ref 36.0–46.0)
Hemoglobin: 4.1 g/dL — CL (ref 12.0–15.0)
Lymphocytes Relative: 20 % (ref 12–46)
Lymphs Abs: 1.5 10*3/uL (ref 0.7–4.0)
MCH: 19.2 pg — ABNORMAL LOW (ref 26.0–34.0)
MCHC: 26.3 g/dL — ABNORMAL LOW (ref 30.0–36.0)
MCV: 72.9 fL — ABNORMAL LOW (ref 78.0–100.0)
Monocytes Absolute: 0.4 10*3/uL (ref 0.1–1.0)
Monocytes Relative: 6 % (ref 3–12)
Neutro Abs: 5.4 10*3/uL (ref 1.7–7.7)
Neutrophils Relative %: 73 % (ref 43–77)
Platelets: 517 10*3/uL — ABNORMAL HIGH (ref 150–400)
RBC: 2.14 MIL/uL — ABNORMAL LOW (ref 3.87–5.11)
RDW: 31.3 % — ABNORMAL HIGH (ref 11.5–15.5)
WBC: 7.4 10*3/uL (ref 4.0–10.5)

## 2012-08-31 LAB — COMPREHENSIVE METABOLIC PANEL
ALT: <5 U/L (ref 0–35)
AST: 10 U/L (ref 0–37)
Albumin: 3.6 g/dL (ref 3.5–5.2)
Alkaline Phosphatase: 65 U/L (ref 39–117)
BUN: 6 mg/dL (ref 6–23)
CO2: 19 mEq/L (ref 19–32)
Calcium: 9 mg/dL (ref 8.4–10.5)
Chloride: 108 mEq/L (ref 96–112)
Creatinine, Ser: 0.55 mg/dL (ref 0.50–1.10)
GFR calc Af Amer: >90 mL/min (ref >90)
GFR calc non Af Amer: >90 mL/min (ref >90)
Glucose, Bld: 141 mg/dL — ABNORMAL HIGH (ref 70–99)
Potassium: 3.7 mEq/L (ref 3.5–5.1)
Sodium: 138 mEq/L (ref 135–145)
Total Bilirubin: 0.3 mg/dL (ref 0.3–1.2)
Total Protein: 7.4 g/dL (ref 6.0–8.3)

## 2012-08-31 LAB — POCT CBC
Granulocyte percent: 69.2 %G (ref 37–80)
HCT, POC: 16.7 % — AB (ref 37.7–47.9)
Hemoglobin: 4.4 g/dL — AB (ref 12.2–16.2)
Lymph, poc: 2.2 (ref 0.6–3.4)
MCH, POC: 18.2 pg — AB (ref 27–31.2)
MCHC: 26.3 g/dL — AB (ref 31.8–35.4)
MCV: 68.9 fL — AB (ref 80–97)
MID (cbc): 0.6 (ref 0–0.9)
MPV: 7.3 fL (ref 0–99.8)
POC Granulocyte: 6.3 (ref 2–6.9)
POC LYMPH PERCENT: 24.3 %L (ref 10–50)
POC MID %: 6.5 %M (ref 0–12)
Platelet Count, POC: 600 10*3/uL — AB (ref 142–424)
RBC: 2.42 M/uL — AB (ref 4.04–5.48)
RDW, POC: 28.3 %
WBC: 9.1 10*3/uL (ref 4.6–10.2)

## 2012-08-31 LAB — FERRITIN
Ferritin: 2 ng/mL — ABNORMAL LOW (ref 10–291)
Ferritin: 1 ng/mL — ABNORMAL LOW (ref 10–291)

## 2012-08-31 LAB — IRON AND TIBC: Iron: <10 ug/dL — ABNORMAL LOW (ref 42–135)

## 2012-08-31 LAB — VITAMIN B12
Vitamin B-12: 574 pg/mL (ref 211–911)
Vitamin B-12: 593 pg/mL (ref 211–911)

## 2012-08-31 LAB — FOLATE: Folate: 10.7 ng/mL

## 2012-08-31 LAB — PREPARE RBC (CROSSMATCH)

## 2012-08-31 LAB — RETICULOCYTES
Retic Count, Absolute: 20.3 10*3/uL (ref 19.0–186.0)
Retic Ct Pct: 1 % (ref 0.4–3.1)

## 2012-08-31 LAB — LACTATE DEHYDROGENASE: LDH: 229 U/L (ref 94–250)

## 2012-08-31 MED ORDER — ALPRAZOLAM 0.5 MG PO TABS: 1.0000 mg | ORAL_TABLET | Freq: Three times a day (TID) | ORAL | Status: DC | PRN

## 2012-08-31 MED ORDER — ALPRAZOLAM 0.5 MG PO TABS: 0.2500 mg | ORAL_TABLET | Freq: Three times a day (TID) | ORAL | Status: DC | PRN

## 2012-08-31 MED ORDER — FERROUS SULFATE 325 (65 FE) MG PO TABS
325.0000 mg | ORAL_TABLET | Freq: Every day | ORAL | Status: DC
  Administered 2012-09-01: 325 mg via ORAL
  Filled 2012-08-31 (×2): qty 1

## 2012-08-31 MED ORDER — ALPRAZOLAM 0.5 MG PO TABS
0.5000 mg | ORAL_TABLET | Freq: Three times a day (TID) | ORAL | Status: DC | PRN
  Administered 2012-08-31 – 2012-09-01 (×4): 0.5 mg via ORAL
  Filled 2012-08-31 (×4): qty 1

## 2012-08-31 MED ORDER — PROMETHAZINE HCL 25 MG PO TABS: 25.0000 mg | ORAL_TABLET | Freq: Three times a day (TID) | ORAL | Status: DC | PRN

## 2012-08-31 MED ORDER — ACETAMINOPHEN 650 MG RE SUPP
650.0000 mg | Freq: Four times a day (QID) | RECTAL | Status: DC | PRN
  Filled 2012-08-31: qty 2

## 2012-08-31 MED ORDER — CALCIUM-VITAMIN D-VITAMIN K 500-500-40 MG-UNT-MCG PO CHEW: 1.0000 | CHEWABLE_TABLET | Freq: Every day | ORAL | Status: DC

## 2012-08-31 MED ORDER — SODIUM CHLORIDE 0.9 % IJ SOLN
3.0000 mL | Freq: Two times a day (BID) | INTRAMUSCULAR | Status: DC
  Administered 2012-08-31 – 2012-09-01 (×2): 3 mL via INTRAVENOUS

## 2012-08-31 MED ORDER — ACETAMINOPHEN 325 MG PO TABS
650.0000 mg | ORAL_TABLET | Freq: Four times a day (QID) | ORAL | Status: DC | PRN
  Administered 2012-08-31 – 2012-09-01 (×3): 650 mg via ORAL
  Filled 2012-08-31 (×3): qty 2

## 2012-08-31 MED ORDER — PROMETHAZINE HCL 12.5 MG PO TABS: 12.5000 mg | ORAL_TABLET | Freq: Four times a day (QID) | ORAL | Status: DC | PRN

## 2012-08-31 MED ORDER — VITAMIN B-12 100 MCG PO TABS: 100.0000 ug | ORAL_TABLET | Freq: Every day | ORAL | Status: DC

## 2012-08-31 MED ORDER — CALCIUM CARBONATE-VITAMIN D 500-200 MG-UNIT PO TABS
1.0000 | ORAL_TABLET | Freq: Every day | ORAL | Status: DC
  Administered 2012-09-01: 1 via ORAL
  Filled 2012-08-31 (×2): qty 1

## 2012-08-31 MED ORDER — DICLOFENAC SODIUM 75 MG PO TBEC: 75.0000 mg | DELAYED_RELEASE_TABLET | Freq: Two times a day (BID) | ORAL | Status: DC

## 2012-08-31 MED ORDER — PREDNISONE 20 MG PO TABS: ORAL_TABLET | ORAL | Status: DC

## 2012-08-31 MED ORDER — VITAMIN B-12 100 MCG PO TABS
100.0000 ug | ORAL_TABLET | Freq: Every day | ORAL | Status: DC
  Administered 2012-09-01: 100 ug via ORAL
  Filled 2012-08-31: qty 1

## 2012-08-31 NOTE — H&P (Signed)
I have seen and examined this patient. I have discussed with Dr Armen Pickup.  I agree with their findings and plans as documented in their admit  note.  Acute Issues 1. Microcytic Anemia, Profound - Minimally symptomatic, suspect slow chronic decline in hemoglobin from decreased erythocyte production (iron deficiency) and excess RBC loss (chronic Heavy Menstrual Bleeding). - Menstruating women are at high risk for iron deficiency and anemia after gastric bypass surgery - Pt is in discussion with her gynecologist about treatment options for her HMB, including hysterectomy. Plan Follow up anemia panel results Agree with PRBC transfusion to > 7.0 g/dL Hemoglobin Start per oral ferrous sulfate. Would consider addition of  vitamin C 250 mg tablet (or half glass of orange Juice) to each dose of ferrous sulfate to increase GI tract acidity thereby possibly improving iron absorption.  Restart oral Multivitamin with minerals Restart oral Vitamin B12 1000 mcg daily.

## 2012-08-31 NOTE — ED Notes (Signed)
Seen Doctor today for left wrist pain and diarrhea for three days.  Blood work completed HGB 4.0. Skin is pale. Ax4

## 2012-08-31 NOTE — ED Notes (Signed)
1st unit PRBC completed.
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
## 2012-08-31 NOTE — H&P (Signed)
Tammy [name] is an 36 y.o. female.   Chief Complaint: Hgb of 4, wrist pain HPI:  36 yo F with past medical history significant for gastric bypass surgery and menorrhagia  presents with 5 days of diarrhea and nausea followed by 3 episodes of emesis yesterday. She denies fever, the emesis was non-bloody and non-bilious. She admits to sick contacts with her children having GI upset and nausea since last week.  Additionally, she has a 5 day history of L wrist pain. She woke up with wrist pain Monday AM. She denies wrist swelling or injury. She presented to her PCP today due to her ongoing GI upset and ongoing L wrist pain. It was at her PCP office that she was found to have Hgb of 4. She admits to fatigue for the past 2 weeks, heavy menstrual bleeding, LMP 08/16/12,  7 day cycle, saturating 10 pads per day, no bleeding since 08/23/12.  She has not been 100% compliant with her iron and B12.   Admits to shortness of breath Denies blood in stool and dark tarry stools.  Denies chest pain and palpitations Denies dysuria, hematuria  She admits to low back pain and finger pain and PIP joint swelling and stiffness. Denies rash.   Past Medical History  Diagnosis Date  . Anemia    Past Surgical History  Procedure Date  . Gastric bypass 2005  . Tubal ligation 2002   Family History  Problem Relation Age of Onset  . Hypertension Mother   . Hepatitis Mother   . Hyperthyroidism Mother    Social History:  reports that she has never smoked. She has never used smokeless tobacco. She reports that she drinks alcohol. She reports that she does not use illicit drugs.  Allergies:  Allergies  Allergen Reactions  . Nsaids Other (See Comments)    Had gastric bypass sx- Pt has a sensitivity to drug   Results for orders placed during the hospital encounter of 08/31/12 (from the past 48 hour(s))  CBC WITH DIFFERENTIAL     Status: Abnormal   Collection Time   08/31/12 12:15 PM      Component Value Range  Comment   WBC 7.4  4.0 - 10.5 K/uL    RBC 2.14 (*) 3.87 - 5.11 MIL/uL    Hemoglobin 4.1 (*) 12.0 - 15.0 g/dL    HCT 96.0 (*) 45.4 - 46.0 %    MCV 72.9 (*) 78.0 - 100.0 fL    MCH 19.2 (*) 26.0 - 34.0 pg    MCHC 26.3 (*) 30.0 - 36.0 g/dL    RDW 09.8 (*) 11.9 - 15.5 %    Platelets 517 (*) 150 - 400 K/uL    Neutrophils Relative 73  43 - 77 %    Lymphocytes Relative 20  12 - 46 %    Monocytes Relative 6  3 - 12 %    Eosinophils Relative 1  0 - 5 %    Basophils Relative 0  0 - 1 %    Neutro Abs 5.4  1.7 - 7.7 K/uL    Lymphs Abs 1.5  0.7 - 4.0 K/uL    Monocytes Absolute 0.4  0.1 - 1.0 K/uL    Eosinophils Absolute 0.1  0.0 - 0.7 K/uL    Basophils Absolute 0.0  0.0 - 0.1 K/uL    RBC Morphology ELLIPTOCYTES   POLYCHROMASIA PRESENT   Smear Review        Value: PLATELET CLUMPS NOTED ON SMEAR, COUNT APPEARS INCREASED  COMPREHENSIVE METABOLIC PANEL     Status: Abnormal   Collection Time   08/31/12 12:15 PM      Component Value Range Comment   Sodium 138  135 - 145 mEq/L    Potassium 3.7  3.5 - 5.1 mEq/L    Chloride 108  96 - 112 mEq/L    CO2 19  19 - 32 mEq/L    Glucose, Bld 141 (*) 70 - 99 mg/dL    BUN 6  6 - 23 mg/dL    Creatinine, Ser 1.61  0.50 - 1.10 mg/dL    Calcium 9.0  8.4 - 09.6 mg/dL    Total Protein 7.4  6.0 - 8.3 g/dL    Albumin 3.6  3.5 - 5.2 g/dL    AST 10  0 - 37 U/L    ALT <5  0 - 35 U/L    Alkaline Phosphatase 65  39 - 117 U/L    Total Bilirubin 0.3  0.3 - 1.2 mg/dL    GFR calc non Af Amer >90  >90 mL/min    GFR calc Af Amer >90  >90 mL/min   TYPE AND SCREEN     Status: Normal (Preliminary result)   Collection Time   08/31/12 12:25 PM      Component Value Range Comment   ABO/RH(D) B POS      Antibody Screen NEG      Sample Expiration 09/03/2012      Unit Number [id number]      Blood Component Type RED CELLS,LR      Unit division 00      Status of Unit ALLOCATED      Transfusion Status OK TO TRANSFUSE      Crossmatch Result Compatible      Unit Number  [id number]      Blood Component Type RED CELLS,LR      Unit division 00      Status of Unit ALLOCATED      Transfusion Status OK TO TRANSFUSE      Crossmatch Result Compatible     PREPARE RBC (CROSSMATCH)     Status: Normal   Collection Time   08/31/12 12:25 PM      Component Value Range Comment   Order Confirmation ORDER PROCESSED BY BLOOD BANK     ABO/RH     Status: Normal   Collection Time   08/31/12 12:25 PM      Component Value Range Comment   ABO/RH(D) B POS     RETICULOCYTES     Status: Abnormal   Collection Time   08/31/12 12:53 PM      Component Value Range Comment   Retic Ct Pct 1.0  0.4 - 3.1 %    RBC. 2.03 (*) 3.87 - 5.11 MIL/uL    Retic Count, Manual 20.3  19.0 - 186.0 K/uL    Dg Wrist Complete Left  08/31/2012  *RADIOLOGY REPORT*  Clinical Data: Left wrist pain  LEFT WRIST - COMPLETE 3+ VIEW  Comparison: None.  Findings: No displaced fracture or dislocation.  No aggressive osseous lesions.  IMPRESSION: No acute osseous abnormality identified of the left wrist. If clinical concern for a fracture persists, recommend a repeat radiograph in 5-10 days to evaluate for interval change or callus formation.   Original Report Authenticated By: Waneta Martins, M.D.    Review of Systems  C: fevers, chills, sweats CV: no chest pain, tachycardia, or palpitations Resp: +SOB occasionally and exertional today GI: positive as above  GU : no dysuria, no hematuria MSK: pain in wrists and fingers as above, low back pain Skin: no rash or lesion  Blood pressure 150/103, pulse 97, temperature 98.3 F (36.8 C), temperature source Oral, resp. rate 18, last menstrual period 08/16/2012, SpO2 100.00%. Physical Exam  Gen: NAD, alert, cooperative with exam, comfortable in bed, significant pallor at first glance HEENT: NCAT, EOMI, sclera white, conjunctiva pale Neck: supple CV: RRR, good S1/S2, no murmur Resp: CTABL, no wheezes, non-labored Abd: SNTND, BS present, no guarding or  organomegaly Ext: no edema, pallor throughout but brisk cap refill MSK: bony and paraspinal tenderness of low back starting at L4 Neuro: Alert and oriented, No gross deficits  FOBT weakly positive  Assessment/Plan [name] is a 36 y/o female with PMH of anemia s/p gastric bypass with Hgb of 4.1 today at her PCPs office and a few days of nausea and diarrhea. . The etiology of her acute anemia is likely multifactorial. She has been inconsistent with her iron and B12 supplements in the last 6 weeks, she has menorrhagia, and has a weakly positive fecal occult blood test on exam. WIth her sick contacts and lack of hematochezia and melena it's most likely that she does not have a significant GI bleed, considering that the weakly positive FOBT could be from irritation 2/2 diarrahea. It's likely that she has an iron deficit due to malabsorption and an increased need for iron due to her menorrhagia.   1. Anemia- Microcytic with hgb of 4.1 today on admission. With her only having mild symptoms it makes it even more suspicious that this is an exacerbation of a chronic anemia by her recent menorrhagia and that she has depleted iron stores to replace the lost blood. Other DDx includes hemolytic anemia and acute blood loss from a GI bleed.   - Admit to tele  - Anemia panel collected and pending, obtain coags  - LDH and haptoglobin to r/o hemolytic etiology  - replace iron with iron sulfate TID  - Transfuse times 2 units and repeat CBC afterward, goal Hgb of 7, may need additional units.   - CBC in the Am  2. Nausea, vomiting, diarrhea: Most likely viral gastroenteritis but possibly irritation from an acute GI bleed although unlikely with lack of hematochezia or melena and only weakly positive FOBT.   - Afebrile  - regular diet for now  - Fluids if she cannot tolerate PO diet   3. L wrist pain- With xrays showing no acute osseus abnormality and a job that likely puts a high demand on her joints  it's most likely that tendonitis is the most likely etiology. She does have a history concerning for inflammatory arthritis with swelling of her PIP joints off and on and significant morning stiffness. Other DDx include inflammatory aritis like RA, gonococcal arthritis, and OA.  - Scheduled tylenol, plan to increase to ultram if needed  - discontinue prednisone that was recently given OP as may exacerbate any GI bleed  -  Monitor pain  4. HTN- elevated and doesn't currently take anything outpatient  - Plan to add chlorthalidone if she is persistently high here.   - Monitor  FENGI: reg diet, saline lock for now PPx: SCDs Code: Full Dispo: Home pending resolution and work up of her anemia  Kevin Fenton 3:07 PM , 08/31/12  I examined the patient with Dr. Ermalinda Memos. I have reviewed the note, made necessary revisions and agree with above.   Harjit Douds 08/31/2012, 3:07 PM

## 2012-08-31 NOTE — ED Notes (Signed)
Notified Dr. Judd Lien and Kyla Balzarine PA of pt having a critical low hemoglobin of 4.1.

## 2012-08-31 NOTE — ED Provider Notes (Signed)
History     CSN: [id number]  Arrival date & time 08/31/12  1204   First MD Initiated Contact with Patient 08/31/12 1229      Chief Complaint  Patient presents with  . Abnormal Lab    (Consider location/radiation/quality/duration/timing/severity/associated sxs/prior treatment) HPI Comments: Tammy [name] is a 36 y.o. Female who presents with complaint of an abnormal lab. Pt states she has had n/v/d for several days. States it is improving. She went to her PCP today, and they did blood tests, states it showed that her hgb was 4.4, pt was sent here. Pt does admit to fatigue and headache. Denies dizziness. States had gastric bypass in 2005, since then has not been compliant with her supplements and vitamins. States had hgb of 6 once, did not get transfused. Pt has no other complaints. She received phenergan PO at her doctors office and feeling better.    Past Medical History  Diagnosis Date  . Anemia     Past Surgical History  Procedure Date  . Gastric bypass   . Tubal ligation     No family history on file.  History  Substance Use Topics  . Smoking status: Never Smoker   . Smokeless tobacco: Not on file  . Alcohol Use: No    OB History    Grav Para Term Preterm Abortions TAB SAB Ect Mult Living                  Review of Systems  Constitutional: Negative for fever and chills.  HENT: Negative for neck pain and neck stiffness.   Respiratory: Negative.   Cardiovascular: Negative.   Gastrointestinal: Positive for nausea, vomiting and diarrhea. Negative for abdominal pain.  Genitourinary: Negative for dysuria and flank pain.  Musculoskeletal: Negative.   Skin: Negative.   Neurological: Positive for weakness and headaches. Negative for dizziness, syncope and numbness.  Psychiatric/Behavioral: Negative.     Allergies  Nsaids  Home Medications   Current Outpatient Rx  Name Route Sig Dispense Refill  . ALPRAZOLAM 0.5 MG PO TABS Oral Take 0.5-1 tablets  (0.25-0.5 mg total) by mouth every 8 (eight) hours as needed for sleep. May fill on/after 08/23/12 90 tablet 0  . DICLOFENAC SODIUM 75 MG PO TBEC Oral Take 1 tablet (75 mg total) by mouth 2 (two) times daily. 60 tablet 2  . ONE-DAILY MULTI VITAMINS PO TABS Oral Take 1 tablet by mouth daily.    Marland Kitchen PREDNISONE 20 MG PO TABS  3/3/2/2/1/1 single daily dose for 6 days 12 tablet 0  . PROMETHAZINE HCL 25 MG PO TABS Oral Take 1 tablet (25 mg total) by mouth every 8 (eight) hours as needed for nausea. 30 tablet 0    BP 150/103  Pulse 97  Temp 98.3 F (36.8 C) (Oral)  Resp 18  SpO2 100%  LMP 08/16/2012  Physical Exam  Nursing note and vitals reviewed. Constitutional: She is oriented to person, place, and time. She appears well-developed and well-nourished. No distress.  HENT:  Head: Normocephalic and atraumatic.  Eyes:       Conjunctiva pale  Neck: Normal range of motion. Neck supple.  Cardiovascular: Normal rate, regular rhythm and normal heart sounds.   Pulmonary/Chest: Effort normal and breath sounds normal.  Abdominal: Soft. Bowel sounds are normal. She exhibits no distension. There is no tenderness. There is no rebound.  Musculoskeletal: Normal range of motion.  Neurological: She is alert and oriented to person, place, and time.  Skin: Skin is warm and dry.  There is pallor.  Psychiatric: She has a normal mood and affect.    ED Course  Procedures (including critical care time)  Results for orders placed during the hospital encounter of 08/31/12  CBC WITH DIFFERENTIAL      Component Value Range   WBC 7.4  4.0 - 10.5 K/uL   RBC 2.14 (*) 3.87 - 5.11 MIL/uL   Hemoglobin 4.1 (*) 12.0 - 15.0 g/dL   HCT 40.9 (*) 81.1 - 91.4 %   MCV 72.9 (*) 78.0 - 100.0 fL   MCH 19.2 (*) 26.0 - 34.0 pg   MCHC 26.3 (*) 30.0 - 36.0 g/dL   RDW 78.2 (*) 95.6 - 21.3 %   Platelets 517 (*) 150 - 400 K/uL   Neutrophils Relative 73  43 - 77 %   Lymphocytes Relative 20  12 - 46 %   Monocytes Relative 6  3 - 12  %   Eosinophils Relative 1  0 - 5 %   Basophils Relative 0  0 - 1 %   Neutro Abs 5.4  1.7 - 7.7 K/uL   Lymphs Abs 1.5  0.7 - 4.0 K/uL   Monocytes Absolute 0.4  0.1 - 1.0 K/uL   Eosinophils Absolute 0.1  0.0 - 0.7 K/uL   Basophils Absolute 0.0  0.0 - 0.1 K/uL   RBC Morphology ELLIPTOCYTES     Smear Review       Value: PLATELET CLUMPS NOTED ON SMEAR, COUNT APPEARS INCREASED  COMPREHENSIVE METABOLIC PANEL      Component Value Range   Sodium 138  135 - 145 mEq/L   Potassium 3.7  3.5 - 5.1 mEq/L   Chloride 108  96 - 112 mEq/L   CO2 19  19 - 32 mEq/L   Glucose, Bld 141 (*) 70 - 99 mg/dL   BUN 6  6 - 23 mg/dL   Creatinine, Ser 0.86  0.50 - 1.10 mg/dL   Calcium 9.0  8.4 - 57.8 mg/dL   Total Protein 7.4  6.0 - 8.3 g/dL   Albumin 3.6  3.5 - 5.2 g/dL   AST 10  0 - 37 U/L   ALT <5  0 - 35 U/L   Alkaline Phosphatase 65  39 - 117 U/L   Total Bilirubin 0.3  0.3 - 1.2 mg/dL   GFR calc non Af Amer >90  >90 mL/min   GFR calc Af Amer >90  >90 mL/min  TYPE AND SCREEN      Component Value Range   ABO/RH(D) B POS     Antibody Screen NEG     Sample Expiration 09/03/2012     Unit Number [id number]     Blood Component Type RED CELLS,LR     Unit division 00     Status of Unit ALLOCATED     Transfusion Status OK TO TRANSFUSE     Crossmatch Result Compatible     Unit Number [id number]     Blood Component Type RED CELLS,LR     Unit division 00     Status of Unit ISSUED     Transfusion Status OK TO TRANSFUSE     Crossmatch Result Compatible    RETICULOCYTES      Component Value Range   Retic Ct Pct 1.0  0.4 - 3.1 %   RBC. 2.03 (*) 3.87 - 5.11 MIL/uL   Retic Count, Manual 20.3  19.0 - 186.0 K/uL  PREPARE RBC (CROSSMATCH)      Component Value Range  Order Confirmation ORDER PROCESSED BY BLOOD BANK    ABO/RH      Component Value Range   ABO/RH(D) B POS    POCT PREGNANCY, URINE      Component Value Range   Preg Test, Ur NEGATIVE  NEGATIVE  OCCULT BLOOD, POC DEVICE      Component  Value Range   Fecal Occult Bld POSITIVE    LACTATE DEHYDROGENASE      Component Value Range   LDH 229  94 - 250 U/L   Pt with hgb 4.1. No source at this time, possibly due to malnutrition vs GI or vaginal bleeding, pt has hx of heavy periods. Will admit.   Family practice to admit.   1. Anemia       MDM         Lottie Mussel, PA 08/31/12 1647

## 2012-09-01 LAB — CBC WITH DIFFERENTIAL/PLATELET
Basophils Absolute: 0 10*3/uL (ref 0.0–0.1)
Basophils Relative: 0 % (ref 0–1)
Eosinophils Relative: 0 % (ref 0–5)
HCT: 19.6 % — ABNORMAL LOW (ref 36.0–46.0)
MCHC: 30.6 g/dL (ref 30.0–36.0)
MCV: 74 fL — ABNORMAL LOW (ref 78.0–100.0)
Monocytes Absolute: 0.9 10*3/uL (ref 0.1–1.0)
RDW: 26.2 % — ABNORMAL HIGH (ref 11.5–15.5)

## 2012-09-01 LAB — CBC
MCH: 23.1 pg — ABNORMAL LOW (ref 26.0–34.0)
MCHC: 30.4 g/dL (ref 30.0–36.0)
Platelets: 610 10*3/uL — ABNORMAL HIGH (ref 150–400)

## 2012-09-01 MED ORDER — FERROUS SULFATE 325 (65 FE) MG PO TABS: 325.0000 mg | ORAL_TABLET | Freq: Three times a day (TID) | ORAL | Status: DC

## 2012-09-01 MED ORDER — INFLUENZA VIRUS VACC SPLIT PF IM SUSP
0.5000 mL | Freq: Once | INTRAMUSCULAR | Status: AC
  Administered 2012-09-01: 0.5 mL via INTRAMUSCULAR
  Filled 2012-09-01: qty 0.5

## 2012-09-01 MED ORDER — SENNA-DOCUSATE SODIUM 8.6-50 MG PO TABS: 1.0000 | ORAL_TABLET | Freq: Every day | ORAL | Status: DC

## 2012-09-01 NOTE — Progress Notes (Signed)
I discussed with  Dr Bradshaw.  I agree with their plans documented in their progress note for today.  

## 2012-09-01 NOTE — Discharge Summary (Signed)
I discussed with  Dr Bradshaw.  I agree with their plans documented in their discharge  note for today.   

## 2012-09-01 NOTE — Discharge Summary (Signed)
Family Medicine Teaching [facility] Discharge Summary  Patient name: Tammy [name] Medical record number: [id number] Date of birth: [birth year] Age: 36 y.o. Gender: female Date of Admission: 08/31/2012  Date of Discharge: 09/01/2012 Admitting Physician: Leighton Roach McDiarmid, MD  Primary Care Provider: No primary provider on file.  Indication for Hospitalization: Anemia Discharge Diagnoses:  1. Iron defficincy anemia 2. Status post gastric bypass  Consultations: None  Significant Labs and Imaging:    08/31/2012 10:15 08/31/2012 12:15 09/01/2012 01:16 09/01/2012 13:40  Hemoglobin 4.4 (A) 4.1 (LL) 6.0 (LL) 7.7 (L)   Haptoglobin: 93  LDH : 229  DG Wrist complete Left 08/31/2012 IMPRESSION:  No acute osseous abnormality identified of the left wrist. If  clinical concern for a fracture persists, recommend a repeat  radiograph in 5-10 days to evaluate for interval change or callus  formation.  Procedures: Transfusion of PRBC times 3  Brief Hospital Course:  [name] is a 36 y/o female with PMH of anemia s/p gastric bypass with presenting with a Hgb of 4.1 at her PCPs office and a few days of nausea and diarrhea. She was treated as detailed below:   1. Anemia:  After our work up and listening to her story we believe that it is most likely due to iron deficit, mennorhagia and inconsistency with B12 and iron lately. At presentation she had an Hgb of 4.1. She was initially transfused wuith 2 yunits and follow up CBC showed an increase to in Hgb to 6.0. She was transfused 1 more unit an repeat CBC was 7.7. She was found to be severely iron deficient, and iron replacement was restarted TID PO along with a stool softener. At discharge she felt much less weak and was encouraged to see her OB/Gyn to evaluate her severe menorrhagia.   LDH and haptoglobin were both WNL ruling out hemolytic etiology.   She was found to be weakly FOBT positive but without melena or hematochezia it  was felt this could be explained by a recent diarreal illness. The patient was advised to seek medical attention if she notices any hematochezia or melena.    2. Nausea, vomiting, diarrhea: WIth her sick children it was felt that this was most likely viral gastroenteritis. However it could possibly beirritation from an acute GI bleed although unlikely with lack of hematochezia or melena and only weakly positive FOBT. She remained afebrile, tolerated a PO diet well and her nausea largely resolved before discharge.    3. L wrist pain - with no bony problems visible on x ray its likely tendonitis vs inflammatory arthritis. It was controlled on PO tylenol during admission.   4. HTN - Elevated as high as 150/95 but averaging in teh high 130s/80s. She doesn't currently take any HTN meds and none were started during admission.    Discharge Medications:    Medication List     As of 09/01/2012  3:17 PM    TAKE these medications         ALPRAZolam 0.5 MG tablet   Commonly known as: XANAX   Take 0.25-0.5 mg by mouth every 8 (eight) hours as needed. For anxiety  May fill on/after 08/23/12      CALCIUM SOFT CHEWS PO   Take 1 each by mouth 2 (two) times daily.      cyclobenzaprine 10 MG tablet   Commonly known as: FLEXERIL   Take 10 mg by mouth at bedtime as needed. For muscle spasms      diclofenac  75 MG EC tablet   Commonly known as: VOLTAREN   Take 75 mg by mouth 2 (two) times daily.      ferrous sulfate 325 (65 FE) MG tablet   Take 1 tablet (325 mg total) by mouth 3 (three) times daily with meals.      multivitamin tablet   Take 1 tablet by mouth daily.      predniSONE 5 MG Tabs   Commonly known as: STERAPRED UNI-PAK   Take 5-15 mg by mouth daily. Tapered dose; 12 tablets, started 08/31/2012      promethazine 25 MG tablet   Commonly known as: PHENERGAN   Take 25 mg by mouth every 8 (eight) hours as needed. For nausea      sennosides-docusate sodium 8.6-50 MG tablet   Commonly known  as: SENOKOT-S   Take 1 tablet by mouth daily.      VITAMIN B12 PO   Take 1 tablet by mouth daily.        Issues for Follow Up: Consider repeat CBC and repeat FOBT as it was weakly positive hear. Monitor HTN for need of medication.   Outstanding Results: None  Discharge Instructions: Please refer to Patient Instructions section of EMR for full details.  Patient was counseled important signs and symptoms that should prompt return to medical care, changes in medications, dietary instructions, activity restrictions, and follow up appointments.  Significant instructions noted below:  Follow-up Information    Call Kathreen Cosier, MD.   Contact information:   [address] [place] [address] [phone]       Follow up with [facility]. (Walk in in 3-5 days)    Contact information:   [address] [place] [address]          Discharge Condition: Carlena Sax, MD 09/01/2012, 3:17 PM

## 2012-09-01 NOTE — Progress Notes (Signed)
Utilization review completed.  

## 2012-09-01 NOTE — Progress Notes (Signed)
S/O: CBC post transfusion resulted in an HgB of 6.0. A/P:  - 1 unit of RBC to be transfused now - Nursing ordered to place post transfusion CBC - Goal HgB 7

## 2012-09-01 NOTE — Progress Notes (Signed)
Family Medicine Teaching Service Daily Progress Note Service Page: [phone]  Patient Assessment: 36 y/o female with  With PMH of anemia s/p gastric bypass in 2005 presenting form urgent care with Hgb of 4.1.    Subjective: No acute events overnight, feels better, didn't realize how weak feeling she was. L wrist pain and headache relieved with tylenol. PLanning to see ob/gyn next week.   Objective: Temp:  [97.9 F (36.6 C)-99 F (37.2 C)] 98.7 F (37.1 C) (09/27 0645) Pulse Rate:  [76-112] 80  (09/27 0645) Resp:  [14-20] 16  (09/27 0645) BP: (116-158)/(73-103) 116/73 mmHg (09/27 0645) SpO2:  [99 %-100 %] 100 % (09/27 0645) Weight:  [153 lb (69.4 kg)-154 lb 5.2 oz (70 kg)] 154 lb 5.2 oz (70 kg) (09/27 0556) Exam: Gen: NAD, alert, cooperative with exam HEENT: NCAT, EOMI, PERRL, mmm CV: RRR, no murmur, good s1/s2 brisk cap refill Resp: CTABL, no wheezes, non-labored Abd: SNTND, BS present, no guarding or organomegaly Ext: No edema  Neuro: Alert and oriented, No gross deficits   I have reviewed the patient's medications, labs, imaging, and diagnostic testing.  Notable results are summarized below.  CBC BMET   Lab 09/01/12 0116 08/31/12 1215 08/31/12 1015  WBC 9.8 7.4 9.1  HGB 6.0* 4.1* 4.4*  HCT 19.6* 15.6* 16.7*  PLT 520* 517* --    Lab 08/31/12 1215  NA 138  K 3.7  CL 108  CO2 19  BUN 6  CREATININE 0.55  GLUCOSE 141*  CALCIUM 9.0     Imaging/Diagnostic Tests: DG Wrist Left 08/31/2012 IMPRESSION:  No acute osseous abnormality identified of the left wrist. If  clinical concern for a fracture persists, recommend a repeat  radiograph in 5-10 days to evaluate for interval change or callus  formation.   Plan: Tammy [name] is a 36 y/o female with PMH of anemia s/p gastric bypass with Hgb of 4.1 today at her PCPs office and a few days of nausea and diarrhea. Anemia likely due to iron deficit and mennorhagia andinconsistency with B12 and iron lately. Also  suspicious of GI source with weakly positive FOBT.   1. Anemia- Microcytic with hgb of 4.1 today on admission. Mild symptoms on presentation indicates chronicity of her condition. - Anemia panel collected and pending, obtain coags  - LDH and haptoglobin to r/o hemolytic etiology  - replace iron with iron sulfate TID  - Transfused 2 units with appropriate rise to Hgb of 6/0, rtransfused times 1 more unit. Rechecking CBC this Am post last transfusion - monitor with daily CBC  2. Nausea, vomiting, diarrhea: Most likely viral gastroenteritis but possibly irritation from an acute GI bleed although unlikely with lack of hematochezia or melena and only weakly positive FOBT.  - Afebrile  - regular diet for now  - Fluids if she cannot tolerate PO diet   3. L wrist pain- tendonitis vs inflammatory arthritis, controlled with current meds - Scheduled tylenol, plan to increase to ultram if needed  - discontinue prednisone that was recently given OP as may exacerbate any GI bleed  - Monitor pain   4. HTN- elevated and doesn't currently take anything outpatient, average over night approx 140/90 - Plan to add chlorthalidone if she is persistently high here.  - Monitor   FENGI: reg diet, saline lock for now  PPx: SCDs  Code: Full  Dispo: Home pending resolution of anemia and OP follow up for Tammy Shorter, MD 09/01/2012, 7:18 AM

## 2012-09-01 NOTE — ED Provider Notes (Signed)
Medical screening examination/treatment/procedure(s) were performed by non-physician practitioner and as supervising physician I was immediately available for consultation/collaboration.  Geoffery Lyons, MD 09/01/12 [phone]

## 2012-09-02 LAB — TYPE AND SCREEN
ABO/RH(D): B POS
Antibody Screen: NEGATIVE
Unit division: 0
Unit division: 0
Unit division: 0

## 2012-09-06 ENCOUNTER — Other Ambulatory Visit (HOSPITAL_COMMUNITY): Payer: Self-pay | Admitting: Obstetrics

## 2012-09-06 ENCOUNTER — Telehealth: Payer: Self-pay

## 2012-09-06 DIAGNOSIS — Z1231 Encounter for screening mammogram for malignant neoplasm of breast: Secondary | ICD-10-CM
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
## 2012-09-07 ENCOUNTER — Other Ambulatory Visit: Payer: Self-pay | Admitting: Obstetrics

## 2012-09-07 NOTE — Telephone Encounter (Signed)
Yes, it is to fine to fill now

## 2012-09-08 NOTE — Telephone Encounter (Signed)
Called pharmacy and advised okay to fill this today. Called patient to advise

## 2012-09-19 ENCOUNTER — Other Ambulatory Visit: Payer: Self-pay | Admitting: Radiology

## 2012-09-24 ENCOUNTER — Other Ambulatory Visit: Payer: Self-pay | Admitting: *Deleted

## 2012-09-24 MED ORDER — DICLOFENAC SODIUM 75 MG PO TBEC: 75.0000 mg | DELAYED_RELEASE_TABLET | Freq: Two times a day (BID) | ORAL | Status: DC

## 2012-09-26 ENCOUNTER — Ambulatory Visit (HOSPITAL_COMMUNITY)
Admission: RE | Admit: 2012-09-26 | Discharge: 2012-09-26 | Disposition: A | Discharge status: Home / Self Care | Payer: Federal, State, Local not specified - PPO | Source: Ambulatory Visit | Attending: Obstetrics | Admitting: Obstetrics

## 2012-09-26 ENCOUNTER — Other Ambulatory Visit: Payer: Self-pay | Admitting: Radiology

## 2012-09-26 DIAGNOSIS — Z1231 Encounter for screening mammogram for malignant neoplasm of breast: Secondary | ICD-10-CM

## 2012-09-28 ENCOUNTER — Other Ambulatory Visit: Payer: Self-pay | Admitting: Obstetrics

## 2012-09-28 ENCOUNTER — Encounter (HOSPITAL_COMMUNITY): Payer: Self-pay | Admitting: Pharmacist

## 2012-09-28 DIAGNOSIS — R928 Other abnormal and inconclusive findings on diagnostic imaging of breast: Secondary | ICD-10-CM

## 2012-10-04 ENCOUNTER — Encounter (HOSPITAL_COMMUNITY)
Admission: RE | Admit: 2012-10-04 | Discharge: 2012-10-04 | Disposition: A | Discharge status: Home / Self Care | Payer: Federal, State, Local not specified - PPO | Source: Ambulatory Visit | Attending: Obstetrics | Admitting: Obstetrics

## 2012-10-04 ENCOUNTER — Encounter (HOSPITAL_COMMUNITY): Payer: Self-pay

## 2012-10-04 HISTORY — DX: Anxiety disorder, unspecified: F41.9

## 2012-10-04 LAB — DIFFERENTIAL
Basophils Absolute: 0 10*3/uL (ref 0.0–0.1)
Basophils Relative: 0 % (ref 0–1)
Eosinophils Absolute: 0.1 10*3/uL (ref 0.0–0.7)
Eosinophils Relative: 2 % (ref 0–5)
Lymphs Abs: 1.5 10*3/uL (ref 0.7–4.0)
Neutrophils Relative %: 61 % (ref 43–77)

## 2012-10-04 LAB — CBC
MCH: 27.2 pg (ref 26.0–34.0)
MCHC: 29.6 g/dL — ABNORMAL LOW (ref 30.0–36.0)
Platelets: 558 10*3/uL — ABNORMAL HIGH (ref 150–400)
RBC: 4.45 MIL/uL (ref 3.87–5.11)
RDW: 22.5 % — ABNORMAL HIGH (ref 11.5–15.5)

## 2012-10-04 NOTE — Patient Instructions (Addendum)
20 Tammy [name]  10/04/2012   Your procedure is scheduled on:  10/11/12  Enter through the Main Entrance of [facility] at 6 AM.  Pick up the phone at the desk and dial [phone].   Call this number if you have problems the morning of surgery: [phone]   Remember:   Do not eat food:After Midnight.  Do not drink clear liquids: After Midnight.  Take these medicines the morning of surgery with A SIP OF WATER: May take Xanax if needed   Do not wear jewelry, make-up or nail polish.  Do not wear lotions, powders, or perfumes. You may wear deodorant.  Do not shave 48 hours prior to surgery.  Do not bring valuables to the hospital.  Contacts, dentures or bridgework may not be worn into surgery.  Leave suitcase in the car. After surgery it may be brought to your room.  For patients admitted to the hospital, checkout time is 11:00 AM the day of discharge.   Patients discharged the day of surgery will not be allowed to drive home.  Name and phone number of your driver: Husband: Leotis Shames  Special Instructions: Shower using CHG 2 nights before surgery and the night before surgery.  If you shower the day of surgery use CHG.  Use special wash - you have one bottle of CHG for all showers.  You should use approximately 1/3 of the bottle for each shower.   Please read over the following fact sheets that you were given: Surgical Site Infection Prevention

## 2012-10-07 NOTE — Consult Note (Signed)
NAMEMarland [name]  KWEEN, BACORN      ACCOUNT NO.:  [id number]  MEDICAL RECORD NO.:  [id number]  LOCATION:  [facility]IO                         FACILITY:  [facility]  PHYSICIAN:  Kathreen Cosier, M.D.DATE OF BIRTH:  1976/04/04  DATE OF CONSULTATION: DATE OF DISCHARGE:                                CONSULTATION   DATE OF SURGERY:  October 11, 2012.  HISTORY OF PRESENT ILLNESS:  The patient is a 36 year old gravida 5, para 5-0-0-5 whose last menstrual period was on August 16, 2012.  The patient is in for heavy bleeding.  Her hemoglobin had fallen to 4 and she got 3 units of packed cells, and she is in for hysteroscopy, D and C, and HTA.  PAST MEDICAL HISTORY:  She had a history of hypertension, but now she normotensive.  PAST SURGICAL HISTORY:  She had gastric bypass.  SOCIAL HISTORY:  Negative.  SYSTEM REVIEW:  Negative.  PHYSICAL EXAMINATION:  GENERAL:  Revealed a well-developed female in no distress. HEENT:  Negative. BREAST:  Negative. LUNGS:  Clear to P and A. HEART:  Regular rhythm.  No murmurs, no gallops. ABDOMEN:  Negative. UTERUS:  Normal size.  Negative adnexa.  Negative Pap. EXTREMITIES:  Negative.          ______________________________ Kathreen Cosier, M.D.     BAM/MEDQ  D:  10/06/2012  T:  10/07/2012  Job:  [id number]

## 2012-10-10 ENCOUNTER — Ambulatory Visit
Admission: RE | Admit: 2012-10-10 | Discharge: 2012-10-10 | Disposition: A | Discharge status: Home / Self Care | Payer: Federal, State, Local not specified - PPO | Source: Ambulatory Visit | Attending: Obstetrics | Admitting: Obstetrics

## 2012-10-10 ENCOUNTER — Encounter (HOSPITAL_COMMUNITY): Payer: Self-pay | Admitting: Anesthesiology

## 2012-10-10 DIAGNOSIS — R928 Other abnormal and inconclusive findings on diagnostic imaging of breast: Secondary | ICD-10-CM
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
## 2012-10-11 ENCOUNTER — Encounter (HOSPITAL_COMMUNITY): Payer: Self-pay | Admitting: Anesthesiology

## 2012-10-11 ENCOUNTER — Ambulatory Visit (HOSPITAL_COMMUNITY): Payer: Federal, State, Local not specified - PPO | Admitting: Anesthesiology

## 2012-10-11 ENCOUNTER — Ambulatory Visit (HOSPITAL_COMMUNITY)
Admission: RE | Admit: 2012-10-11 | Discharge: 2012-10-11 | Disposition: A | Discharge status: Home / Self Care | Payer: Federal, State, Local not specified - PPO | Source: Ambulatory Visit | Attending: Obstetrics | Admitting: Obstetrics

## 2012-10-11 ENCOUNTER — Encounter (HOSPITAL_COMMUNITY): Payer: Self-pay | Admitting: *Deleted

## 2012-10-11 ENCOUNTER — Encounter (HOSPITAL_COMMUNITY)
Admission: RE | Disposition: A | Discharge status: Home / Self Care | Payer: Self-pay | Source: Ambulatory Visit | Attending: Obstetrics

## 2012-10-11 DIAGNOSIS — Z01818 Encounter for other preprocedural examination: Secondary | ICD-10-CM | POA: Insufficient documentation

## 2012-10-11 DIAGNOSIS — N938 Other specified abnormal uterine and vaginal bleeding: Secondary | ICD-10-CM | POA: Insufficient documentation

## 2012-10-11 DIAGNOSIS — Z01812 Encounter for preprocedural laboratory examination: Secondary | ICD-10-CM | POA: Insufficient documentation

## 2012-10-11 DIAGNOSIS — N949 Unspecified condition associated with female genital organs and menstrual cycle: Secondary | ICD-10-CM | POA: Insufficient documentation

## 2012-10-11 HISTORY — PX: DILITATION & CURRETTAGE/HYSTROSCOPY WITH HYDROTHERMAL ABLATION: SHX5570

## 2012-10-11 LAB — PREGNANCY, URINE: Preg Test, Ur: NEGATIVE

## 2012-10-11 LAB — TYPE AND SCREEN: ABO/RH(D): B POS

## 2012-10-11 SURGERY — DILATATION & CURETTAGE/HYSTEROSCOPY WITH HYDROTHERMAL ABLATION
Anesthesia: General | Site: Uterus | Wound class: Clean Contaminated

## 2012-10-11 MED ORDER — LACTATED RINGERS IV SOLN
INTRAVENOUS | Status: DC
  Administered 2012-10-11: 50 mL/h via INTRAVENOUS
  Administered 2012-10-11: 125 mL/h via INTRAVENOUS

## 2012-10-11 MED ORDER — FENTANYL CITRATE 0.05 MG/ML IJ SOLN
INTRAMUSCULAR | Status: DC | PRN
  Administered 2012-10-11 (×2): 50 ug via INTRAVENOUS

## 2012-10-11 MED ORDER — FENTANYL CITRATE 0.05 MG/ML IJ SOLN
INTRAMUSCULAR | Status: AC
  Administered 2012-10-11: 50 ug via INTRAVENOUS
  Filled 2012-10-11: qty 2

## 2012-10-11 MED ORDER — ACETAMINOPHEN-CODEINE #3 300-30 MG PO TABS
ORAL_TABLET | ORAL | Status: AC
  Filled 2012-10-11: qty 1

## 2012-10-11 MED ORDER — METOCLOPRAMIDE HCL 5 MG/ML IJ SOLN: 10.0000 mg | Freq: Once | INTRAMUSCULAR | Status: DC | PRN

## 2012-10-11 MED ORDER — MIDAZOLAM HCL 2 MG/2ML IJ SOLN
INTRAMUSCULAR | Status: AC
  Filled 2012-10-11: qty 2

## 2012-10-11 MED ORDER — LIDOCAINE HCL (CARDIAC) 20 MG/ML IV SOLN
INTRAVENOUS | Status: AC
  Filled 2012-10-11: qty 5

## 2012-10-11 MED ORDER — PROPOFOL 10 MG/ML IV EMUL
INTRAVENOUS | Status: DC | PRN
  Administered 2012-10-11: 200 mg via INTRAVENOUS

## 2012-10-11 MED ORDER — LIDOCAINE HCL (CARDIAC) 20 MG/ML IV SOLN
INTRAVENOUS | Status: DC | PRN
  Administered 2012-10-11 (×2): 20 mg via INTRAVENOUS

## 2012-10-11 MED ORDER — KETOROLAC TROMETHAMINE 30 MG/ML IJ SOLN
INTRAMUSCULAR | Status: DC | PRN
  Administered 2012-10-11: 30 mg via INTRAVENOUS

## 2012-10-11 MED ORDER — LIDOCAINE HCL 1 % IJ SOLN
INTRAMUSCULAR | Status: DC | PRN
  Administered 2012-10-11: 20 mL

## 2012-10-11 MED ORDER — PROPOFOL 10 MG/ML IV EMUL
INTRAVENOUS | Status: AC
  Filled 2012-10-11: qty 20

## 2012-10-11 MED ORDER — SODIUM CHLORIDE 0.9 % IR SOLN
Status: DC | PRN
  Administered 2012-10-11: 3000 mL

## 2012-10-11 MED ORDER — ACETAMINOPHEN-CODEINE #3 300-30 MG PO TABS
1.0000 | ORAL_TABLET | Freq: Once | ORAL | Status: AC
  Administered 2012-10-11: 1 via ORAL

## 2012-10-11 MED ORDER — ONDANSETRON HCL 4 MG/2ML IJ SOLN
INTRAMUSCULAR | Status: DC | PRN
  Administered 2012-10-11: 4 mg via INTRAVENOUS

## 2012-10-11 MED ORDER — MEPERIDINE HCL 25 MG/ML IJ SOLN: 6.2500 mg | INTRAMUSCULAR | Status: DC | PRN

## 2012-10-11 MED ORDER — FENTANYL CITRATE 0.05 MG/ML IJ SOLN
INTRAMUSCULAR | Status: AC
  Filled 2012-10-11: qty 2

## 2012-10-11 MED ORDER — ONDANSETRON HCL 4 MG/2ML IJ SOLN
INTRAMUSCULAR | Status: AC
  Filled 2012-10-11: qty 2

## 2012-10-11 MED ORDER — MIDAZOLAM HCL 5 MG/5ML IJ SOLN
INTRAMUSCULAR | Status: DC | PRN
  Administered 2012-10-11: 2 mg via INTRAVENOUS

## 2012-10-11 MED ORDER — FENTANYL CITRATE 0.05 MG/ML IJ SOLN
25.0000 ug | INTRAMUSCULAR | Status: DC | PRN
  Administered 2012-10-11 (×3): 50 ug via INTRAVENOUS

## 2012-10-11 MED ORDER — KETOROLAC TROMETHAMINE 30 MG/ML IJ SOLN
INTRAMUSCULAR | Status: AC
  Filled 2012-10-11: qty 1

## 2012-10-11 SURGICAL SUPPLY — 12 items
CATH ROBINSON RED A/P 16FR (CATHETERS) ×2 IMPLANT
CLOTH BEACON ORANGE TIMEOUT ST (SAFETY) ×2 IMPLANT
CONTAINER PREFILL 10% NBF 60ML (FORM) ×4 IMPLANT
DRAPE HYSTEROSCOPY (DRAPE) ×2 IMPLANT
DRESSING TELFA 8X3 (GAUZE/BANDAGES/DRESSINGS) ×2 IMPLANT
GLOVE BIO SURGEON STRL SZ8.5 (GLOVE) ×4 IMPLANT
GOWN PREVENTION PLUS XXLARGE (GOWN DISPOSABLE) ×2 IMPLANT
GOWN STRL REIN XL XLG (GOWN DISPOSABLE) ×4 IMPLANT
PACK VAGINAL MINOR WOMEN LF (CUSTOM PROCEDURE TRAY) ×2 IMPLANT
PAD OB MATERNITY 4.3X12.25 (PERSONAL CARE ITEMS) ×2 IMPLANT
SET GENESYS HTA PROCERVA (MISCELLANEOUS) ×2 IMPLANT
TOWEL OR 17X24 6PK STRL BLUE (TOWEL DISPOSABLE) ×4 IMPLANT

## 2012-10-11 NOTE — Anesthesia Postprocedure Evaluation (Signed)
Anesthesia Post Note  Patient: Tammy [name]  Procedure(s) Performed: Procedure(s) (LRB): DILATATION & CURETTAGE/HYSTEROSCOPY WITH HYDROTHERMAL ABLATION (N/A)  Anesthesia type: GA  Patient location: PACU  Post pain: Pain level controlled  Post assessment: Post-op Vital signs reviewed  Last Vitals:  Filed Vitals:   10/11/12 0900  BP: 155/90  Pulse: 61  Temp:   Resp: 20    Post vital signs: Reviewed  Level of consciousness: sedated  Complications: No apparent anesthesia complications

## 2012-10-11 NOTE — H&P (Signed)
  There has been no change in her history and physical since that time of dictation

## 2012-10-11 NOTE — Op Note (Signed)
preop diagnosis dysfunctional uterine bleeding postop diagnosis same surgeon Dr. 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

## 2012-10-11 NOTE — Transfer of Care (Signed)
Immediate Anesthesia Transfer of Care Note  Patient: Tammy [name]  Procedure(s) Performed: Procedure(s) (LRB) with comments: DILATATION & CURETTAGE/HYSTEROSCOPY WITH HYDROTHERMAL ABLATION (N/A)  Patient Location: PACU  Anesthesia Type:General  Level of Consciousness: awake, alert , oriented and patient cooperative  Airway & Oxygen Therapy: Patient Spontanous Breathing and Patient connected to nasal cannula oxygen  Post-op Assessment: Report given to PACU RN and Post -op Vital signs reviewed and stable  Post vital signs: Reviewed and stable  Complications: No apparent anesthesia complications

## 2012-10-12 ENCOUNTER — Encounter (HOSPITAL_COMMUNITY): Payer: Self-pay | Admitting: Obstetrics

## 2012-10-25 ENCOUNTER — Encounter: Payer: Self-pay | Admitting: Internal Medicine

## 2012-10-25 ENCOUNTER — Ambulatory Visit (INDEPENDENT_AMBULATORY_CARE_PROVIDER_SITE_OTHER): Payer: Federal, State, Local not specified - PPO | Admitting: Internal Medicine

## 2012-10-25 VITALS — BP 140/102 | HR 76 | Temp 98.2°F | Resp 16 | Ht 66.0 in | Wt 166.4 lb

## 2012-10-25 DIAGNOSIS — M549 Dorsalgia, unspecified: Secondary | ICD-10-CM

## 2012-10-25 DIAGNOSIS — F411 Generalized anxiety disorder: Secondary | ICD-10-CM

## 2012-10-25 DIAGNOSIS — Z23 Encounter for immunization: Secondary | ICD-10-CM

## 2012-10-25 DIAGNOSIS — F419 Anxiety disorder, unspecified: Secondary | ICD-10-CM | POA: Insufficient documentation

## 2012-10-25 DIAGNOSIS — A599 Trichomoniasis, unspecified: Secondary | ICD-10-CM

## 2012-10-25 MED ORDER — ALPRAZOLAM 0.5 MG PO TABS: 0.5000 mg | ORAL_TABLET | Freq: Three times a day (TID) | ORAL | Status: DC | PRN

## 2012-10-25 MED ORDER — DICLOFENAC SODIUM 75 MG PO TBEC: 75.0000 mg | DELAYED_RELEASE_TABLET | Freq: Two times a day (BID) | ORAL | Status: DC

## 2012-10-25 MED ORDER — BUPROPION HCL ER (XL) 150 MG PO TB24: ORAL_TABLET | ORAL | Status: DC

## 2012-10-25 MED ORDER — CYCLOBENZAPRINE HCL 10 MG PO TABS: 10.0000 mg | ORAL_TABLET | Freq: Every day | ORAL | Status: DC

## 2012-10-25 MED ORDER — METRONIDAZOLE 500 MG PO TABS: 500.0000 mg | ORAL_TABLET | Freq: Two times a day (BID) | ORAL | Status: DC
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
## 2012-12-13 ENCOUNTER — Encounter: Payer: Self-pay | Admitting: Internal Medicine

## 2012-12-13 ENCOUNTER — Ambulatory Visit (INDEPENDENT_AMBULATORY_CARE_PROVIDER_SITE_OTHER): Payer: Federal, State, Local not specified - PPO | Admitting: Internal Medicine

## 2012-12-13 VITALS — BP 172/100 | HR 64 | Temp 98.8°F | Resp 16 | Ht 66.5 in | Wt 163.0 lb

## 2012-12-13 DIAGNOSIS — F411 Generalized anxiety disorder: Secondary | ICD-10-CM

## 2012-12-13 DIAGNOSIS — R030 Elevated blood-pressure reading, without diagnosis of hypertension: Secondary | ICD-10-CM

## 2012-12-13 DIAGNOSIS — F419 Anxiety disorder, unspecified: Secondary | ICD-10-CM

## 2012-12-13 DIAGNOSIS — M549 Dorsalgia, unspecified: Secondary | ICD-10-CM

## 2012-12-13 DIAGNOSIS — IMO0001 Reserved for inherently not codable concepts without codable children: Secondary | ICD-10-CM

## 2012-12-13 DIAGNOSIS — D649 Anemia, unspecified: Secondary | ICD-10-CM

## 2012-12-13 LAB — COMPREHENSIVE METABOLIC PANEL
AST: 12 U/L (ref 0–37)
Alkaline Phosphatase: 71 U/L (ref 39–117)
BUN: 13 mg/dL (ref 6–23)
Creat: 0.75 mg/dL (ref 0.50–1.10)
Glucose, Bld: 82 mg/dL (ref 70–99)
Total Bilirubin: 0.4 mg/dL (ref 0.3–1.2)

## 2012-12-13 LAB — CBC WITH DIFFERENTIAL/PLATELET
Eosinophils Absolute: 0.1 10*3/uL (ref 0.0–0.7)
Hemoglobin: 16.1 g/dL — ABNORMAL HIGH (ref 12.0–15.0)
Lymphocytes Relative: 46 % (ref 12–46)
Lymphs Abs: 2.1 10*3/uL (ref 0.7–4.0)
MCH: 30.1 pg (ref 26.0–34.0)
Monocytes Relative: 8 % (ref 3–12)
Neutro Abs: 2 10*3/uL (ref 1.7–7.7)
Neutrophils Relative %: 45 % (ref 43–77)
RBC: 5.34 MIL/uL — ABNORMAL HIGH (ref 3.87–5.11)
WBC: 4.5 10*3/uL (ref 4.0–10.5)

## 2012-12-13 LAB — LIPID PANEL
Cholesterol: 121 mg/dL (ref 0–200)
HDL: 57 mg/dL (ref >39)
Total CHOL/HDL Ratio: 2.1 Ratio
Triglycerides: 56 mg/dL (ref <150)
VLDL: 11 mg/dL (ref 0–40)

## 2012-12-13 MED ORDER — CYCLOBENZAPRINE HCL 10 MG PO TABS: 10.0000 mg | ORAL_TABLET | Freq: Every day | ORAL | Status: DC

## 2012-12-13 MED ORDER — ALPRAZOLAM 0.5 MG PO TABS: 0.5000 mg | ORAL_TABLET | Freq: Three times a day (TID) | ORAL | Status: DC | PRN

## 2012-12-13 MED ORDER — DICLOFENAC SODIUM 75 MG PO TBEC: 75.0000 mg | DELAYED_RELEASE_TABLET | Freq: Two times a day (BID) | ORAL | Status: DC

## 2012-12-13 MED ORDER — BUPROPION HCL ER (XL) 300 MG PO TB24: ORAL_TABLET | ORAL | Status: DC

## 2012-12-13 NOTE — Progress Notes (Signed)
Subjective:    Patient ID: Tammy [name], female    DOB: 1976-04-06, 37 y.o.   MRN: [id number]  HPIf/u Patient Active Problem List  Diagnosis  . Lumbosacral disc disease  . Status post gastric bypass for obesity  . Anemia  . Anxiety  see last ov Back much better-about to start yoga relat w/ husband stble but not intimate//unsure future-requesting ref to marital therap... 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  Eyes: Negative for visual disturbance.  Respiratory: Negative for shortness of breath.   Cardiovascular: Negative for chest pain, palpitations and leg swelling.  Gastrointestinal: Negative for abdominal pain, diarrhea and constipation.  Genitourinary: Negative for difficulty urinating and menstrual problem.  Neurological: Negative for headaches.  Psychiatric/Behavioral: Negative for sleep disturbance and dysphoric mood.  hx anemia-has been on Fe 6 months     Objective:   Physical Exam  Constitutional: She is oriented to person, place, and time. She appears well-developed and well-nourished.  Eyes: EOM are normal. Pupils are equal, round, and reactive to light.  Cardiovascular: Normal rate and regular rhythm.   No murmur heard. Pulmonary/Chest: Breath sounds normal.  Musculoskeletal: She exhibits no edema.       slr wnl dtrs symm  Neurological: She is alert and oriented to person, place, and time. She displays normal reflexes. No cranial nerve deficit.  Psychiatric: She has a normal mood and affect. Her behavior is normal. Judgment and thought content normal.          Assessment & Plan:   1. Anxiety  buPROPion (WELLBUTRIN XL) 300 MG 24 hr tablet, TSH  2. Anemia  CBC with Differential  3. Blood pressure elevated w/out dx HTN Lipid panel, Comprehensive metabolic panel, TSH  4. Back pain  cyclobenzaprine  (FLEXERIL) 10 MG tablet, diclofenac (VOLTAREN) 75 MG EC tablet   She'll follow outside [facility] and send me results in 3 weeks Ref S Hunt at [employer] [facility] ref to heather at [facility] Cont flex hs prf/u 3-79mo  Meds ordered this encounter  Medications  . ALPRAZolam (XANAX) 0.5 MG tablet    Sig: Take 1 tablet (0.5 mg total) by mouth 3 (three) times daily as needed for anxiety    Dispense:  90 tablet    Refill:  1  . buPROPion (WELLBUTRIN XL) 300 MG 24 hr tablet    Sig: One tab daily for 4 days then 2 tabs daily    Dispense:  90 tablet    Refill:  1      . cyclobenzaprine (FLEXERIL) 10 MG tablet    Sig: Take 1 tablet (10 mg total) by mouth at bedtime.    Dispense:  30 tablet    Refill:  2  . diclofenac (VOLTAREN) 75 MG EC tablet    Sig: Take 1 tablet (75 mg total) by mouth 2 (two) times daily.    Dispense:  60 tablet    Refill:  2   Results for orders placed in visit on 12/13/12  CBC WITH DIFFERENTIAL      Component Value Range   WBC 4.5  4.0 - 10.5 K/uL   RBC 5.34 (*) 3.87 - 5.11 MIL/uL   Hemoglobin 16.1 (*) 12.0 - 15.0 g/dL   HCT 08.6 (*) 57.8 - 46.9 %   MCV 90.1  78.0 - 100.0 fL   MCH 30.1  26.0 - 34.0 pg   MCHC 33.5  30.0 - 36.0 g/dL   RDW 14.7  82.9 - 56.2 %   Platelets 340  150 - 400 K/uL   Neutrophils Relative 45  43 - 77 %   Neutro Abs 2.0  1.7 - 7.7 K/uL   Lymphocytes Relative 46  12 - 46 %   Lymphs Abs 2.1  0.7 - 4.0 K/uL   Monocytes Relative 8  3 - 12 %   Monocytes Absolute 0.4  0.1 - 1.0 K/uL   Eosinophils Relative 1  0 - 5 %   Eosinophils Absolute 0.1  0.0 - 0.7 K/uL   Basophils Relative 0  0 - 1 %   Basophils Absolute 0.0  0.0 - 0.1 K/uL   Smear Review Criteria for review not met    LIPID PANEL      Component Value Range   Cholesterol 121  0 - 200 mg/dL   Triglycerides 56  <130 mg/dL   HDL 57  >86 mg/dL   Total CHOL/HDL Ratio 2.1     VLDL 11  0 - 40 mg/dL   LDL Cholesterol 53  0 - 99 mg/dL  COMPREHENSIVE METABOLIC PANEL      Component Value Range   Sodium 137   135 - 145 mEq/L   Potassium 4.1  3.5 - 5.3 mEq/L   Chloride 109  96 - 112 mEq/L   CO2 20  19 - 32 mEq/L   Glucose, Bld 82  70 - 99 mg/dL   BUN 13  6 - 23 mg/dL   Creat 5.78  4.69 - 6.29 mg/dL   Total Bilirubin 0.4  0.3 - 1.2 mg/dL   Alkaline Phosphatase 71  39 - 117 U/L   AST 12  0 - 37 U/L   ALT 12  0 - 35 U/L   Total Protein 7.2  6.0 - 8.3 g/dL   Albumin 4.3  3.5 - 5.2 g/dL   Calcium 9.0  8.4 - 52.8 mg/dL  TSH      Component Value Range   TSH 0.230 (*) 0.350 - 4.500 uIU/mL   May d/c fe!

## 2012-12-15 ENCOUNTER — Other Ambulatory Visit (HOSPITAL_COMMUNITY): Payer: Self-pay | Admitting: Family Medicine

## 2012-12-15 ENCOUNTER — Encounter: Payer: Self-pay | Admitting: Internal Medicine

## 2012-12-18 ENCOUNTER — Other Ambulatory Visit: Payer: Self-pay | Admitting: Internal Medicine

## 2013-01-13 ENCOUNTER — Other Ambulatory Visit: Payer: Self-pay | Admitting: Physician Assistant

## 2013-01-14 NOTE — Telephone Encounter (Signed)
rx for 1/8 had a refill

## 2013-01-15 NOTE — Telephone Encounter (Signed)
Sent denial, which states the RX from 1/8 had refill.

## 2013-02-13 ENCOUNTER — Other Ambulatory Visit: Payer: Self-pay | Admitting: Internal Medicine

## 2013-03-14 ENCOUNTER — Ambulatory Visit (INDEPENDENT_AMBULATORY_CARE_PROVIDER_SITE_OTHER): Payer: Federal, State, Local not specified - PPO | Admitting: Internal Medicine

## 2013-03-14 ENCOUNTER — Encounter: Payer: Self-pay | Admitting: Internal Medicine

## 2013-03-14 VITALS — BP 166/109 | HR 79 | Temp 99.0°F | Resp 16 | Ht 66.0 in | Wt 169.0 lb

## 2013-03-14 DIAGNOSIS — I10 Essential (primary) hypertension: Secondary | ICD-10-CM | POA: Insufficient documentation

## 2013-03-14 MED ORDER — ALPRAZOLAM 0.5 MG PO TABS: 0.5000 mg | ORAL_TABLET | Freq: Three times a day (TID) | ORAL | Status: DC | PRN

## 2013-03-14 MED ORDER — LISINOPRIL-HYDROCHLOROTHIAZIDE 10-12.5 MG PO TABS: 1.0000 | ORAL_TABLET | Freq: Every day | ORAL | Status: DC

## 2013-03-14 NOTE — Progress Notes (Signed)
  Subjective:    Patient ID: Tammy [name], female    DOB: 19-Sep-1976, 37 y.o.   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 lisinopril-hydrochlorothiazide (PRINZIDE,ZESTORETIC) 10-12.5 MG per tablet    Sig: Take 1 tablet by mouth daily.    Dispense:  90 tablet    Refill:  3   Home blood pressures Followup if not controlled otherwise in 6 months

## 2013-03-25 ENCOUNTER — Other Ambulatory Visit: Payer: Self-pay | Admitting: Internal Medicine

## 2013-03-29 ENCOUNTER — Telehealth: Payer: Self-pay

## 2013-03-29 DIAGNOSIS — F419 Anxiety disorder, unspecified: Secondary | ICD-10-CM

## 2013-03-29 MED ORDER — BUPROPION HCL ER (XL) 300 MG PO TB24: ORAL_TABLET | ORAL | Status: DC

## 2013-03-29 NOTE — Telephone Encounter (Signed)
Refilled medication for 3 months- pt aware.

## 2013-03-29 NOTE — Telephone Encounter (Signed)
Pt called wanting refill on prescription Wellbutrin. Please let pt know when done [facility] [address]  [phone]

## 2013-05-21 ENCOUNTER — Encounter (HOSPITAL_COMMUNITY): Payer: Self-pay | Admitting: Emergency Medicine

## 2013-05-21 ENCOUNTER — Emergency Department (HOSPITAL_COMMUNITY): Payer: Federal, State, Local not specified - PPO

## 2013-05-21 ENCOUNTER — Emergency Department (HOSPITAL_COMMUNITY)
Admission: EM | Admit: 2013-05-21 | Discharge: 2013-05-21 | Disposition: A | Discharge status: Home / Self Care | Payer: Federal, State, Local not specified - PPO | Attending: Emergency Medicine | Admitting: Emergency Medicine

## 2013-05-21 DIAGNOSIS — Z862 Personal history of diseases of the blood and blood-forming organs and certain disorders involving the immune mechanism: Secondary | ICD-10-CM | POA: Insufficient documentation

## 2013-05-21 DIAGNOSIS — M129 Arthropathy, unspecified: Secondary | ICD-10-CM | POA: Insufficient documentation

## 2013-05-21 DIAGNOSIS — F411 Generalized anxiety disorder: Secondary | ICD-10-CM | POA: Insufficient documentation

## 2013-05-21 DIAGNOSIS — M1990 Unspecified osteoarthritis, unspecified site: Secondary | ICD-10-CM

## 2013-05-21 DIAGNOSIS — Z3202 Encounter for pregnancy test, result negative: Secondary | ICD-10-CM | POA: Insufficient documentation

## 2013-05-21 DIAGNOSIS — R51 Headache: Secondary | ICD-10-CM | POA: Insufficient documentation

## 2013-05-21 DIAGNOSIS — F419 Anxiety disorder, unspecified: Secondary | ICD-10-CM

## 2013-05-21 DIAGNOSIS — Z79899 Other long term (current) drug therapy: Secondary | ICD-10-CM | POA: Insufficient documentation

## 2013-05-21 DIAGNOSIS — Z791 Long term (current) use of non-steroidal anti-inflammatories (NSAID): Secondary | ICD-10-CM | POA: Insufficient documentation

## 2013-05-21 DIAGNOSIS — R569 Unspecified convulsions: Secondary | ICD-10-CM | POA: Insufficient documentation

## 2013-05-21 DIAGNOSIS — I10 Essential (primary) hypertension: Secondary | ICD-10-CM | POA: Insufficient documentation

## 2013-05-21 LAB — URINALYSIS, ROUTINE W REFLEX MICROSCOPIC
Glucose, UA: NEGATIVE mg/dL
Leukocytes, UA: NEGATIVE
Specific Gravity, Urine: 1.019 (ref 1.005–1.030)
pH: 5.5 (ref 5.0–8.0)

## 2013-05-21 LAB — URINE MICROSCOPIC-ADD ON

## 2013-05-21 LAB — CBC
MCH: 30.3 pg (ref 26.0–34.0)
MCV: 90.4 fL (ref 78.0–100.0)
Platelets: 366 10*3/uL (ref 150–400)
RDW: 13.5 % (ref 11.5–15.5)

## 2013-05-21 LAB — POCT I-STAT TROPONIN I

## 2013-05-21 LAB — BASIC METABOLIC PANEL
CO2: 25 mEq/L (ref 19–32)
Calcium: 9.1 mg/dL (ref 8.4–10.5)
Creatinine, Ser: 0.86 mg/dL (ref 0.50–1.10)
GFR calc non Af Amer: 85 mL/min — ABNORMAL LOW (ref >90)
Glucose, Bld: 104 mg/dL — ABNORMAL HIGH (ref 70–99)

## 2013-05-21 LAB — RAPID URINE DRUG SCREEN, HOSP PERFORMED
Barbiturates: NOT DETECTED
Cocaine: NOT DETECTED
Tetrahydrocannabinol: POSITIVE — AB

## 2013-05-21 LAB — POCT PREGNANCY, URINE: Preg Test, Ur: NEGATIVE

## 2013-05-21 MED ORDER — POTASSIUM CHLORIDE CRYS ER 20 MEQ PO TBCR
40.0000 meq | EXTENDED_RELEASE_TABLET | Freq: Once | ORAL | Status: AC
  Administered 2013-05-21: 40 meq via ORAL
  Filled 2013-05-21: qty 2

## 2013-05-21 MED ORDER — SODIUM CHLORIDE 0.9 % IV BOLUS (SEPSIS)
1000.0000 mL | Freq: Once | INTRAVENOUS | Status: AC
  Administered 2013-05-21: 1000 mL via INTRAVENOUS

## 2013-05-21 NOTE — ED Notes (Signed)
Onset today witnessed seizure activity new onset.  Sitting down fell to floor possible hit face with chair in front or floor. Facial abrasions no bleeding present. Upon arrival by EMS patient post ictal enroute  ax4 answer and following commands appropriate.

## 2013-05-21 NOTE — ED Notes (Signed)
Pt's CBG is 116 mg/dl.RN notified.

## 2013-05-21 NOTE — ED Provider Notes (Signed)
History     CSN: [id number]  Arrival date & time 05/21/13  1733   First MD Initiated Contact with Patient 05/21/13 1750      Chief Complaint  Patient presents with  . Seizures    (Consider location/radiation/quality/duration/timing/severity/associated sxs/prior treatment) The history is provided by the patient, the spouse and a friend.  Tammy [name] is a 37 y/o F with PMHx of anemia, anxiety, SVD, HTN, gastric bypass surgery 2005, arthritis presenting to the ED with husband, with alleged seizure episode - patient was at a graduation this afternoon and she stated that she remembers sitting the chair waiting for the ceremony to start and the next thing she knew was that she was on the ground and someone was telling her that EMS was coming for her. Husband reported that friend was with her and told him that patient was sitting fine in the chair and then suddenly patient stiffened up, started to shake, and fell off the chair hitting her head on the ground. Husband reported that friend did not specify how long this episode occurred, he stated that he was in the auditorium - friend is not with patient at this moment. Patient reported that she has no recollection as to what happened. Stated that she has a mild headache, described as a haziness. Reported that she was feeling fine yesterday and that she was fine this morning. Denied blurred vision, chest pain, shortness of breath, difficulty breathing, abdominal symptoms, urinary symptoms, numbness, tingling.   PCP Dr. Merla Riches - last seen on March 14, 2013 - good check-up as per patient.    Past Medical History  Diagnosis Date  . Anemia   . Anxiety   . SVD (spontaneous vaginal delivery)     x 5  . Allergy   . Blood transfusion without reported diagnosis   . Hypertension     Past Surgical History  Procedure Laterality Date  . Gastric bypass  2005     laparoscopic Roux-en-Y gastric bypass  . Tubal ligation  2002  . Gastric outlet  obstruction release  2005    post gastric bypass  . Dilitation & currettage/hystroscopy with hydrothermal ablation  10/11/2012    Procedure: DILATATION & CURETTAGE/HYSTEROSCOPY WITH HYDROTHERMAL ABLATION;  Surgeon: Kathreen Cosier, MD;  Location: [facility];  Service: Gynecology;  Laterality: N/A;    Family History  Problem Relation Age of Onset  . Hypertension Mother   . Hepatitis Mother   . Hyperthyroidism Mother   . Obesity Sister   . Hypertension Sister   . Cancer Maternal Grandmother     uterine  . Heart disease Maternal Grandmother   . Hypertension Maternal Grandmother   . Cancer Maternal Grandfather     multiple myeloma  . Hypertension Maternal Grandfather   . Bipolar disorder Father   . Hypertension Father   . Hepatitis Father   . Migraines Daughter     History  Substance Use Topics  . Smoking status: Never Smoker   . Smokeless tobacco: Never Used  . Alcohol Use: Yes     Comment: rare     OB History   Grav Para Term Preterm Abortions TAB SAB Ect Mult Living   5 5 5       5       Review of Systems  Constitutional: Negative for fever and chills.  HENT: Negative for congestion, sore throat, trouble swallowing, neck pain and neck stiffness.   Eyes: Negative for pain and visual disturbance.  Respiratory: Negative for cough,  chest tightness and shortness of breath.   Cardiovascular: Negative for chest pain.  Gastrointestinal: Negative for nausea, vomiting, abdominal pain, diarrhea, constipation and blood in stool.  Musculoskeletal: Negative for back pain and arthralgias.  Neurological: Positive for seizures (alleged) and headaches. Negative for dizziness, weakness, light-headedness and numbness.  All other systems reviewed and are negative.    Allergies  Nsaids  Home Medications   Current Outpatient Rx  Name  Route  Sig  Dispense  Refill  . ALPRAZolam (XANAX) 0.5 MG tablet   Oral   Take 0.5 mg by mouth 3 (three) times daily as needed. For anxiety          . buPROPion (WELLBUTRIN XL) 300 MG 24 hr tablet   Oral   Take 300 mg by mouth 2 (two) times daily.         . diclofenac (VOLTAREN) 75 MG EC tablet   Oral   Take 1 tablet (75 mg total) by mouth 2 (two) times daily.   60 tablet   2   . lisinopril-hydrochlorothiazide (PRINZIDE,ZESTORETIC) 10-12.5 MG per tablet   Oral   Take 1 tablet by mouth daily.   90 tablet   3     BP 120/65  Pulse 78  Temp(Src) 98 F (36.7 C) (Oral)  Resp 14  SpO2 99%  LMP 05/07/2013  Physical Exam  Nursing note and vitals reviewed. Constitutional: She is oriented to person, place, and time. She appears well-developed and well-nourished. No distress.  HENT:  Head: Normocephalic.  Mouth/Throat: Oropharynx is clear and moist. No oropharyngeal exudate.  Superficial abrasion noted to center of forehead just near the hairline.  Superficial abrasion bridge of nose  Eyes: Conjunctivae and EOM are normal. Pupils are equal, round, and reactive to light. Right eye exhibits no discharge. Left eye exhibits no discharge.  Neck: Normal range of motion. Neck supple. No tracheal deviation present.  Negative neck stiffness Negative nuchal rigidity Negative pain upon palpation to the cervical spine  Cardiovascular: Normal rate, regular rhythm and normal heart sounds.  Exam reveals no friction rub.   No murmur heard. Pulses:      Radial pulses are 2+ on the right side, and 2+ on the left side.       Dorsalis pedis pulses are 2+ on the right side, and 2+ on the left side.  Pulmonary/Chest: Effort normal and breath sounds normal. No respiratory distress. She has no wheezes. She has no rales.  Musculoskeletal: Normal range of motion. She exhibits no edema and no tenderness.  Full ROM to upper and lower extremities, bilaterally Strength 5+/5+ to upper and lower extremities, bilaterally  Lymphadenopathy:    She has no cervical adenopathy.  Neurological: She is alert and oriented to person, place, and time. No cranial  nerve deficit. She exhibits normal muscle tone. Coordination normal.  Cranial nerves III-XII grossly intact Sensation intact to upper and lower extremities, bilaterally, with differentiation to sharp and dull touch  Gait proper, proper balance Patient able to walk on tippy-toes with balance Patient able to walk heel-to-toe with normal balance  Skin: Skin is warm and dry. No rash noted. She is not diaphoretic. No erythema.  Psychiatric: She has a normal mood and affect. Her behavior is normal. Thought content normal.    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  Denied dizziness, nausea - stated that she just feels hot.   9:11PM Discussed labs and findings with Dr. Berlinda Last - Dr. Berlinda Last to see patient.    Date: 05/21/2013  Rate: 80  Rhythm: normal sinus rhythm  QRS Axis: normal  Intervals: normal  ST/T Wave abnormalities: normal  Conduction Disutrbances:none  Narrative Interpretation:   Old EKG Reviewed: unchanged    Labs Reviewed  BASIC METABOLIC PANEL - Abnormal; Notable for the following:    Potassium 3.3 (*)    Glucose, Bld 104 (*)    GFR calc non Af Amer 85 (*)    All other components within normal limits  GLUCOSE, CAPILLARY - Abnormal; Notable for the following:    Glucose-Capillary 116 (*)    All other components within normal limits  URINALYSIS, ROUTINE W REFLEX MICROSCOPIC - Abnormal; Notable for the following:    Hgb urine dipstick SMALL (*)    Ketones, ur 15 (*)    Protein, ur 30 (*)    All other components within normal limits  URINE MICROSCOPIC-ADD ON - Abnormal; Notable for the following:    Squamous Epithelial / LPF FEW (*)    Casts HYALINE CASTS (*)    All other components within normal limits  URINE RAPID DRUG SCREEN (HOSP PERFORMED) - Abnormal; Notable for the following:     Tetrahydrocannabinol POSITIVE (*)    All other components within normal limits  CBC  PROTIME-INR  APTT  PREGNANCY, URINE  POCT PREGNANCY, URINE  POCT I-STAT TROPONIN I   Dg Chest 2 View  05/21/2013   *RADIOLOGY REPORT*  Clinical Data: History of seizures, post fall  CHEST - 2 VIEW  Comparison: 09/01/2004  Findings: Normal cardiac silhouette and mediastinal contours. Evaluation of the retrosternal clear space is obscured secondary to overlying soft tissues.  No focal airspace opacities.  No pleural effusion or pneumothorax.  Unchanged bones.  IMPRESSION: No acute cardiopulmonary disease.   Original Report Authenticated By: Tacey Ruiz, MD   Dg Cervical Spine Complete  05/21/2013   *RADIOLOGY REPORT*  Clinical Data: 37 year old female status post seizure and fall.  CERVICAL SPINE - COMPLETE 4+ VIEW  Comparison: None.  Findings: Normal prevertebral soft tissue contour.  Straightening and mild reversal of cervical lordosis. Cervicothoracic junction alignment is within normal limits.  Relatively preserved disc spaces. Bilateral posterior element alignment is within normal limits.  AP alignment and lung apices within normal limits.  C1-T2 alignment and odontoid within normal limits.  IMPRESSION: No acute fracture or listhesis identified in the cervical spine. Ligamentous injury is not excluded.   Original Report Authenticated By: Erskine Speed, M.D.   Ct Head Wo Contrast  05/21/2013   *RADIOLOGY REPORT*  Clinical Data: 37 year old female with syncope.  Forehead injury.  CT HEAD WITHOUT CONTRAST  Technique:  Contiguous axial images were obtained from the base of the skull through the vertex without contrast.  Comparison: None.  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 Visualized paranasal sinuses and mastoids are clear. Visualized orbit soft tissues are within normal limits.  No scalp soft tissue injury identified. No acute osseous abnormality identified.  Small volume  retained secretions in the right nasopharynx.  Normal cerebral volume. No midline shift, ventriculomegaly, mass effect, evidence of mass lesion, intracranial hemorrhage or evidence of cortically based acute infarction.  Gray-white matter differentiation is within normal limits throughout the brain.  No suspicious intracranial vascular hyperdensity.  IMPRESSION: Normal noncontrast appearance of the brain.  No acute traumatic  injury identified.   Original Report Authenticated By: Erskine Speed, M.D.     1. Seizure-like activity   2. HTN (hypertension)   3. Arthritis   4. Anxiety       MDM   EKG negative findings, troponins negative. Urine negative pregnancy, negative infection noted. INR and aPTT within normal limits. CMP negative findings, mildly low potassium (3.3) - potassium PO given in ED. CBC negative findings. Negative hypoglycemia. Negative findings on chest and cervical spine xray. CT scan of head negative acute injury noted - negative mass effect, hemorrhage. No neurological deficits noted to exam. Patient did not have an episode of seizure or seizure-like activity while being in ED setting. Patient completely alert and oriented - holds conversation. Discussed case and findings with Dr. Berlinda Last who cleared patient for discharge. Patient stable, afebrile. Discussed with patient the importance to eat 3 meals a day and to stay hydrated - patient reported that she does not eat that much and does not drink a lot of fluids. Referred patient to neurology and PCP - discussed with patient to rest and stay hydrated. Discussed with patient to continue to monitor symptoms and if symptoms are to worsen or change - strict return instructions given. Patient agreed to plan of care, understood, all questions answered.         Raymon Mutton, PA-C 05/22/13 [phone]

## 2013-05-24 NOTE — ED Provider Notes (Signed)
Medical screening examination/treatment/procedure(s) were conducted as a shared visit with non-physician practitioner(s) and myself.  I personally evaluated the patient during the encounter   Loren Racer, MD 05/24/13 [phone]

## 2013-05-25 ENCOUNTER — Ambulatory Visit (INDEPENDENT_AMBULATORY_CARE_PROVIDER_SITE_OTHER): Payer: Federal, State, Local not specified - PPO | Admitting: Internal Medicine

## 2013-05-25 VITALS — BP 128/88 | HR 86 | Temp 98.0°F | Resp 16 | Ht 65.5 in | Wt 161.0 lb

## 2013-05-25 DIAGNOSIS — R519 Headache, unspecified: Secondary | ICD-10-CM

## 2013-05-25 DIAGNOSIS — R51 Headache: Secondary | ICD-10-CM

## 2013-05-25 DIAGNOSIS — S069X9S Unspecified intracranial injury with loss of consciousness of unspecified duration, sequela: Secondary | ICD-10-CM

## 2013-05-25 DIAGNOSIS — S060X0S Concussion without loss of consciousness, sequela: Secondary | ICD-10-CM

## 2013-05-25 DIAGNOSIS — I10 Essential (primary) hypertension: Secondary | ICD-10-CM

## 2013-05-25 DIAGNOSIS — M5412 Radiculopathy, cervical region: Secondary | ICD-10-CM

## 2013-05-25 MED ORDER — CYCLOBENZAPRINE HCL 10 MG PO TABS: 10.0000 mg | ORAL_TABLET | Freq: Every day | ORAL | Status: DC

## 2013-05-25 MED ORDER — MELOXICAM 15 MG PO TABS: 15.0000 mg | ORAL_TABLET | Freq: Every day | ORAL | Status: DC

## 2013-05-25 NOTE — Progress Notes (Signed)
  Subjective:    Patient ID: Tammy [name], female    DOB: March 03, 1976, 37 y.o.   MRN: [id number]  HPI followup from emergency room visit History of syncopal episode at graduation with head trauma Evaluation in the ER established normal CT of the brain, neck x-rays and labs. (Except slightly low potassium) she had not eaten all day  Since the event she has had daily headaches pressure-like sensation all over but no visual changes, dizziness, nausea or vomiting. She feels fuzzy but there have been no episodes of memory loss or amnesia. She has been referred to neurology for further evaluation by the emergency room.  She also has continued to have pain in her neck and upper thoracic area extending into the left shoulder and left arm-has tried massage with some relief Occasional tingling in the left hand/wakes with stiffness and pain/no weakness  Patient Active Problem List   Diagnosis Date Noted  . HTN (hypertension) 03/14/2013    Priority: Medium  . Anxiety 10/25/2012  . Anemia 08/31/2012  . Lumbosacral disc disease 07/23/2012  . Status post gastric bypass for obesity 07/23/2012  Current outpatient prescriptions:ALPRAZolam (XANAX) 0.5 MG tablet, Take 0.5 mg by mouth 3 (three) times daily as needed. 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  Review of Systems No fever chills or night sweats No vision changes No chest pain or shortness of breath    Objective:   Physical Exam BP 128/88  Pulse 86  Temp(Src) 98 F (36.7 C) (Oral)  Resp 16  Ht 5' 5.5" (1.664 m)  Wt 161 lb  (73.029 kg)  BMI 26.37 kg/m2  SpO2 98%  LMP 05/07/2013 Pupils equal round reactive to light and accommodation/EOMs conjugate ENT clear Neck range of motion limited by discomfort in flexion and rotation to the right Tender in the left paracervical area posteriorly extending into the trapezius and left scapular border Pain there with shoulder elevation but adduction intact No sensory or motor losses in the upper extremities Heart regular without murmur Cranial nerves 2-12 intact  Deep tendon reflexes symmetrical Gait normal Cerebellar intact     Assessment & Plan:  HA post trauma representing mild concussion cerv radiculitis-secondary to fall Recent syncope-neurological evaluation pending HTN-stable  Meds ordered this encounter  Medications  . meloxicam (MOBIC) 15 MG tablet    Sig: Take 1 tablet (15 mg total) by mouth daily.    Dispense:  30 tablet    Refill:  0  . cyclobenzaprine (FLEXERIL) 10 MG tablet    Sig: Take 1 tablet (10 mg total) by mouth at bedtime.    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

## 2013-06-12 ENCOUNTER — Encounter: Payer: Self-pay | Admitting: Neurology

## 2013-06-12 ENCOUNTER — Ambulatory Visit (INDEPENDENT_AMBULATORY_CARE_PROVIDER_SITE_OTHER): Payer: Federal, State, Local not specified - PPO | Admitting: Neurology

## 2013-06-12 VITALS — BP 112/76 | HR 76 | Temp 97.9°F | Ht 65.5 in | Wt 168.0 lb

## 2013-06-12 DIAGNOSIS — R569 Unspecified convulsions: Secondary | ICD-10-CM

## 2013-06-12 DIAGNOSIS — R55 Syncope and collapse: Secondary | ICD-10-CM

## 2013-06-12 NOTE — Patient Instructions (Addendum)
You very well may have had a seizure.  Wellbutrin is known to lower seizure threshold.  So this in conjunction with the heat and dehydration may have triggered a seizure.   1.  MRI of brain to look for any structural cause for a seizure 2.  EEG to look for evidence of increased seizure tendency 3.  Since this is an isolated event with no prior history of seizure, we will not start an antiepileptic medication.  However, if any of the above tests are positive for an abnormality, we may need to then start a medication. 4.  Since Wellbutrin is known to lower seizure threshold, I recommend that you be switched to another antidepressant. 5.  [address] state law states no driving for 6 months from your event (05/21/13). 6.  For the tension-type headaches, continue Flexeril.  May consider physical therapy for the neck as well.  Your MRI is scheduled at [facility] located at [address] in [place] on Saturday, July 12th at 1:15 pm. Please arrive 15 minutes prior to your appointment time.   [phone].  Your sleep deprived EEG is scheduled at [facility] on Monday, July 28th at 8:00 am.  Please arrive at admitting fifteen minutes prior to your appointment. Enter the hospital off of [address] at [employer].     [phone].

## 2013-06-12 NOTE — Progress Notes (Signed)
NEUROLOGY CONSULTATION NOTE  Tammy [name] MRN: [id number] DOB: 09-02-1976   Referring physician: Dr. Merla Riches Primary care physician: Dr. Merla Riches  Reason for consult:  Seizure  HISTORY OF PRESENT ILLNESS: Tammy [name] is a 37 y.o. female with history of anxiety, hypertension and anemia, who presents for the evaluation of a seizure-like episode.  On 05/21/13, she was at her son's graduation.  It was a busy and hectic weekend. It was high outside, she was running around, and hadn't been eating or drinking much. She just gotten inside out of the heat.  She was sitting and talking to her mother and friend when the next thing she remembers, she was on the ground with her mother looking over her. She had no warning or feeling of wooziness. She had no sensation of lightheadedness or dimming of vision. She was told that her eyes rolled back and she fell 4 Doner for head. She had generalized convulsion lasting approximately 2 minutes. There was no foaming at the mouth.  She was quickly oriented. She noted generalized body aches and headache. She did have urinary incontinence, but no tongue laceration. She was brought to the emergency department.  Images and labs personally reviewed.  CT of the head and cervical spine were unremarkable. Labs were negative for troponin.  UA negative.  WBC 5.3, Hgb 14.5, Hct 43.3, PLT 366, Na 139, K 3.3, Cl 104, CO2 25, BUN 14, Cr 0.86, glucose 104, Ca 9.1.  Since that time, she has had more frequent typical headaches. Her headaches usually start in the back of the head and radiate to the eyes. It is non-throbbing. It is not associated with nausea or photophobia. Her neck feels sore. Usually at a 6/10. Symptoms have improved after the first couple of weeks. She has a history of lumbar radiculopathy in the fall exacerbated this. She notes numbness running down the back of her right leg as well as in the right foot.no focal weakness is noted. She feels very mildly slowed  to think, but generally feeling well.  She has not had any further events. Her medications include bupropion for anxiety. She has been on this for almost one year. There were no recent changes to her dose or to medications.  She has no prior history of seizures. There is no known family history of seizures. She has no history of head trauma or meningitis. She had a normal birth.  She does not smoke, use illicit drugs, and rarely drinks alcohol.  PAST MEDICAL HISTORY: Past Medical History  Diagnosis Date  . Anemia   . Anxiety   . SVD (spontaneous vaginal delivery)     x 5  . Allergy   . Blood transfusion without reported diagnosis   . Hypertension     PAST SURGICAL HISTORY: Past Surgical History  Procedure Laterality Date  . Gastric bypass  2005     laparoscopic Roux-en-Y gastric bypass  . Tubal ligation  2002  . Gastric outlet obstruction release  2005    post gastric bypass  . Dilitation & currettage/hystroscopy with hydrothermal ablation  10/11/2012    Procedure: DILATATION & CURETTAGE/HYSTEROSCOPY WITH HYDROTHERMAL ABLATION;  Surgeon: Kathreen Cosier, MD;  Location: [facility];  Service: Gynecology;  Laterality: N/A;    MEDICATIONS: Current Outpatient Prescriptions on File Prior to Visit  Medication Sig Dispense Refill  . ALPRAZolam (XANAX) 0.5 MG tablet Take 0.5 mg by mouth 3 (three) times daily as needed. For anxiety      . buPROPion [facility]  XL) 300 MG 24 hr tablet Take 300 mg by mouth 2 (two) times daily.      . cyclobenzaprine (FLEXERIL) 10 MG tablet Take 1 tablet (10 mg total) by mouth at bedtime.  30 tablet  0  . lisinopril-hydrochlorothiazide (PRINZIDE,ZESTORETIC) 10-12.5 MG per tablet Take 1 tablet by mouth daily.  90 tablet  3  . meloxicam (MOBIC) 15 MG tablet Take 1 tablet (15 mg total) by mouth daily.  30 tablet  0  . diclofenac (VOLTAREN) 75 MG EC tablet Take 1 tablet (75 mg total) by mouth 2 (two) times daily.  60 tablet  2   No current facility-administered  medications on file prior to visit.    ALLERGIES: Allergies  Allergen Reactions  . Nsaids Other (See Comments)    Had gastric bypass sx- Pt has a sensitivity to drug Pt states that she can take NSAIDS but should use them in moderation    FAMILY HISTORY: Family History  Problem Relation Age of Onset  . Hypertension Mother   . Hepatitis Mother   . Hyperthyroidism Mother   . Obesity Sister   . Hypertension Sister   . Cancer Maternal Grandmother     uterine  . Heart disease Maternal Grandmother   . Hypertension Maternal Grandmother   . Cancer Maternal Grandfather     multiple myeloma  . Hypertension Maternal Grandfather   . Bipolar disorder Father   . Hypertension Father   . Hepatitis Father   . Migraines Daughter     SOCIAL HISTORY: History   Social History  . Marital Status: Married    Spouse Name: N/A    Number of Children: 5  . Years of Education: some colle   Occupational History  . Massage Therapist      Kneaded Injury    Social History Main Topics  . Smoking status: Never Smoker   . Smokeless tobacco: Never Used  . Alcohol Use: Yes     Comment: rare   . Drug Use: No  . Sexually Active: Yes -- Female partner(s)    Birth Control/ Protection: Surgical   Other Topics Concern  . Not on file   Social History Narrative  . No narrative on file    REVIEW OF SYSTEMS: Constitutional: No fevers, chills, or sweats, no generalized fatigue, change in appetite Eyes: No visual changes, double vision, eye pain Ear, nose and throat: No hearing loss, ear pain, nasal congestion, sore throat Cardiovascular: No chest pain, palpitations Respiratory:  No shortness of breath at rest or with exertion, wheezes GastrointestinaI: No nausea, vomiting, diarrhea, abdominal pain, fecal incontinence Genitourinary:  No dysuria, urinary retention or frequency Musculoskeletal:  As above. Integumentary: No rash, pruritus, skin lesions Neurological: as above Psychiatric: No  depression, insomnia, anxiety Endocrine: No palpitations, fatigue, diaphoresis, mood swings, change in appetite, change in weight, increased thirst Hematologic/Lymphatic:  No anemia, purpura, petechiae. Allergic/Immunologic: no itchy/runny eyes, nasal congestion, recent allergic reactions, rashes      PHYSICAL EXAM: Filed Vitals:   06/12/13 1246  BP: 112/76  Pulse: 76  Temp: 97.9 F (36.6 C)   General: No acute distress Head:  Normocephalic/atraumatic Neck: supple, no paraspinal tenderness, full range of motion Back: No paraspinal tenderness Heart: regular rate and rhythm Lungs: Clear to auscultation bilaterally. Neurological Exam: Mental status: alert and oriented to person, place, time and self, speech fluent and not dysarthric, language intact. Cranial nerves: CN I: not tested CN II: pupils equal, round and reactive to light, visual fields intact, fundi unremarkable  CN III, IV, VI:  full range of motion, no nystagmus, no ptosis CN V: facial sensation intact CN VII: upper and lower face symmetric CN VIII: hearing intact CN IX, X: gag intact, uvula midline CN XI: sternocleidomastoid and trapezius muscles intact CN XII: tongue midline Bulk & Tone: normal, no fasciculations. Muscle strength: 5/5 throughout Sensation: Reduced pinprick sensation over the dorsum of the right foot, as well as the lateral right lower leg. Vibration sensation intact. Deep Tendon Reflexes: 2+ throughout, toes downgoing. Finger to nose testing: normal without dysmetria Gait: normal stance and stride, able to walk in tandem. Romberg negative.  IMPRESSION & PLAN: Tammy [name] is a 37 y.o. female with probable first-time isolated seizure. This may have been provoked by the heat, dehydration, and not eating, in the setting of a lower seizure threshold due to bupropion. 1.  MRI of brain to look for any structural cause for a seizure 2.  EEG to look for evidence of increased seizure tendency 3.   Since this is an isolated event with no prior history of seizure, we will not start an antiepileptic medication.  However, if any of the above tests are positive for an abnormality, we may need to then start a medication. 4.  Since Wellbutrin is known to lower seizure threshold, I recommend switching to another antidepressant. 5.  [address] state law states no driving for 6 months from event (05/21/13). 6.  For the tension-type headaches, continue Flexeril.  May consider physical therapy for the neck as well.  Thank you for allowing me to take part in the care of this patient.  Shon Millet, DO  CC: Harrel Lemon. Merla Riches, MD

## 2013-06-16 ENCOUNTER — Other Ambulatory Visit: Payer: Federal, State, Local not specified - PPO

## 2013-06-16 ENCOUNTER — Ambulatory Visit
Admission: RE | Admit: 2013-06-16 | Discharge: 2013-06-16 | Disposition: A | Discharge status: Home / Self Care | Payer: Federal, State, Local not specified - PPO | Source: Ambulatory Visit | Attending: Neurology | Admitting: Neurology

## 2013-06-16 DIAGNOSIS — R55 Syncope and collapse: Secondary | ICD-10-CM

## 2013-06-16 MED ORDER — GADOBENATE DIMEGLUMINE 529 MG/ML IV SOLN
15.0000 mL | Freq: Once | INTRAVENOUS | Status: AC | PRN
  Administered 2013-06-16: 15 mL via INTRAVENOUS

## 2013-06-18 ENCOUNTER — Telehealth: Payer: Self-pay | Admitting: Neurology

## 2013-06-18 NOTE — Telephone Encounter (Signed)
Left the patient a message to return my call.

## 2013-06-18 NOTE — Telephone Encounter (Signed)
Message copied by Benay Spice on Mon Jun 18, 2013  4:25 PM ------      Message from: JAFFE, ADAM R      Created: Mon Jun 18, 2013  7:10 AM       Please let patient know that her MRI looks okay and doesn't show anything that may be a cause of her seizure/loss of consciousness.  There is a small arachnoid cyst, which is incidental and benign, as well as very mild amount of tiny spots on the brain, which may be seen in patients with HTN.  ------

## 2013-06-19 ENCOUNTER — Other Ambulatory Visit: Payer: Federal, State, Local not specified - PPO

## 2013-06-19 NOTE — Telephone Encounter (Signed)
Spoke with the patient. Information given as per Dr. Everlena Cooper below. The patient had no questions or concerns at this time.

## 2013-07-01 ENCOUNTER — Other Ambulatory Visit: Payer: Self-pay | Admitting: Internal Medicine

## 2013-07-02 ENCOUNTER — Other Ambulatory Visit (HOSPITAL_COMMUNITY): Payer: Federal, State, Local not specified - PPO

## 2013-07-16 ENCOUNTER — Other Ambulatory Visit: Payer: Self-pay | Admitting: Internal Medicine

## 2013-07-24 ENCOUNTER — Ambulatory Visit (HOSPITAL_COMMUNITY)
Admission: RE | Admit: 2013-07-24 | Discharge: 2013-07-24 | Disposition: A | Discharge status: Home / Self Care | Payer: Federal, State, Local not specified - PPO | Source: Ambulatory Visit | Attending: Neurology | Admitting: Neurology

## 2013-07-24 DIAGNOSIS — R55 Syncope and collapse: Secondary | ICD-10-CM | POA: Insufficient documentation

## 2013-07-24 DIAGNOSIS — R569 Unspecified convulsions: Secondary | ICD-10-CM

## 2013-07-24 NOTE — Progress Notes (Signed)
EEG Completed; Results Pending  

## 2013-07-27 ENCOUNTER — Telehealth: Payer: Self-pay

## 2013-07-27 ENCOUNTER — Telehealth: Payer: Self-pay | Admitting: Neurology

## 2013-07-27 MED ORDER — LEVETIRACETAM 500 MG PO TABS: 500.0000 mg | ORAL_TABLET | Freq: Two times a day (BID) | ORAL | Status: DC

## 2013-07-27 NOTE — Telephone Encounter (Signed)
Pt states that Dr.Jasse her neurologist informed her that she will need to discontinue use of the Welburin. [phone]

## 2013-07-27 NOTE — Procedures (Signed)
ELECTROENCEPHALOGRAM REPORT  Date of Study: 07/24/2013  Patient's Name: Tammy [name] MRN: [id number] Date of Birth: 15-Sep-1976  Referring Provider: Dr. Everlena Cooper  Indication: isolated spell of loss of consciousness and convulsions  Medications: Bupropion Diclofenac Alprazolam  Technical Summary: This is a multichannel digital EEG recording, using the international 10-20 placement system.  Spike detection software was employed.  Description: The EEG background is symmetric, with a well-developed posterior dominant rhythm of 8-9 Hz, which is reactive to eye opening and closing.  Diffuse beta activity is seen, with a bilateral frontal preponderance.  Stage II sleep is not seen.  Hyperventilation was performed and produced no abnormalities. During photic stimulation, background revealed frontally-predominant brief spike and slow wave complexes, approximately 3-3.5Hz  and lasting for about 1 to 1.5 seconds.  These were seen starting at 9Hz  stimulation.  ECG revealed normal cardiac rate and rhythm.  Impression: This is an abnormal sleep-deprived EEG of the awake and drowsy states due to abnormal photoparoxysmal response.  This may be suggestive of a primary generalized seizure disorder or increased risk for seizures.  Clinical correlation advised.  Adam R. Everlena Cooper, DO

## 2013-07-27 NOTE — Telephone Encounter (Signed)
Take 1 tablet of Wellbutrin daily for 7 days and then one tablet every other day for 7 days and then discontinue 10 followup with me to discuss a replacement medicine sometime after that

## 2013-07-27 NOTE — Telephone Encounter (Signed)
Called Ms. Zavaleta and told her results of EEG.  It was abnormal, showing a photoparoxysmal response.  This could suggest increased risk for seizures.  I recommended starting an AED.  My recommendation is Keppra 500mg  BID.  Side effects discussed.  She is agreeable.  I will place an order.  I also reiterated that she stop the Wellbutrin as it can lower seizure threshold.  She should discuss this with Dr. Merla Riches first.  I will route this message to Dr. Merla Riches.

## 2013-07-30 NOTE — Telephone Encounter (Signed)
Patient advised. She was transferred to make appt.

## 2013-07-31 NOTE — Telephone Encounter (Signed)
Please call her to be sure she has stopped the wellbutrin and is doing well

## 2013-08-01 NOTE — Telephone Encounter (Signed)
Called her. She has had mild headache, but is improving. She states not severe. She is using the Keppra without any problems, to you [facility]

## 2013-08-01 NOTE — Telephone Encounter (Signed)
She also wants to know if there is anything you can recommend for the headache, she states she has tried tylenol/ ibuprofen with some relief, is there anything else you can advise for her to try?

## 2013-08-03 ENCOUNTER — Telehealth: Payer: Self-pay

## 2013-08-03 DIAGNOSIS — R519 Headache, unspecified: Secondary | ICD-10-CM

## 2013-08-03 MED ORDER — BUTALBITAL-APAP-CAFFEINE 50-325-40 MG PO TABS: 1.0000 | ORAL_TABLET | Freq: Four times a day (QID) | ORAL | Status: DC | PRN

## 2013-08-03 NOTE — Telephone Encounter (Signed)
Any suggestions to help pt with sleep?

## 2013-08-03 NOTE — Telephone Encounter (Signed)
PATIENT STATES SOMEONE WAS CHECKING TO SEE WHAT DR. Merla Riches SAID SHE COULD USE FOR HER HEADACHES. DR. Merla Riches IS WEENING  HER OFF OF WELLBUTRIN. SHE TALKED TO SOMEONE ON Monday OR Tuesday, BUT SHE HAS NOT HEARD ANYTHING ELSE BACK. BEST PHONE [phone] (CELL)  PHARMACY CHOICE IS [facility] ON [address].  MBC

## 2013-08-03 NOTE — Telephone Encounter (Signed)
Patient Active Problem List   Diagnosis Date Noted  . HTN (hypertension) 03/14/2013    Priority: Medium  . Anxiety 10/25/2012  . Anemia 08/31/2012  . Lumbosacral disc disease 07/23/2012  . Status post gastric bypass for obesity 07/23/2012    -  seizure disorder  Current outpatient prescriptions:ALPRAZolam (XANAX) 0.5 MG tablet, Take 0.5 mg by mouth 3 (three) times daily as needed. For anxiety, Disp: ,  Calcium Carbonate-Vit D-Min (CALCIUM 1200 PO), Take by mouth., Disp: , Rfl:  Cyanocobalamin (VITAMIN B 12 PO), Take by mouth., Disp: , Rfl: ;   cyclobenzaprine (FLEXERIL) 10 MG tablet, TAKE 1 TABLET BY MOUTH EVERY NIGHT AT BEDTIME, Disp: 30 tablet, Rfl: 0;   diclofenac (VOLTAREN) 75 MG EC tablet, Take 1 tablet (75 mg total) by mouth 2 (two) times daily., Disp: 60 tablet, Rfl: 2;  ferrous sulfate 325 (65 FE) MG tablet, Take 325 mg by mouth daily with breakfast., Disp: , Rfl:  levETIRAcetam (KEPPRA) 500 MG tablet, Take 1 tablet (500 mg total) by mouth 2 (two) times daily., Disp: 60 tablet, Rfl: 11;   lisinopril-hydrochlorothiazide (PRINZIDE,ZESTORETIC) 10-12.5 MG per tablet, Take 1 tablet by mouth daily., Disp: 90 tablet, Rfl: 3;   meloxicam (MOBIC) 15 MG tablet, Take 1 tablet (15 mg total) by mouth daily., Disp: 30 tablet, Rfl: 0;  Multiple Vitamin (MULTIVITAMIN) tablet, Take 1 tablet by mouth daily., Disp: , Rfl:   Would be okay to add Esgic for headaches temporarily

## 2013-08-04 NOTE — Telephone Encounter (Signed)
PT NOTIFIED AND RX FAXED TO PHARMACY

## 2013-08-06 ENCOUNTER — Encounter: Payer: Self-pay | Admitting: Internal Medicine

## 2013-08-06 ENCOUNTER — Ambulatory Visit (INDEPENDENT_AMBULATORY_CARE_PROVIDER_SITE_OTHER): Payer: Federal, State, Local not specified - PPO | Admitting: Internal Medicine

## 2013-08-06 VITALS — BP 112/74 | HR 80 | Temp 98.1°F | Resp 16 | Ht 66.0 in | Wt 160.0 lb

## 2013-08-06 DIAGNOSIS — R51 Headache: Secondary | ICD-10-CM

## 2013-08-06 DIAGNOSIS — R112 Nausea with vomiting, unspecified: Secondary | ICD-10-CM

## 2013-08-06 DIAGNOSIS — G40909 Epilepsy, unspecified, not intractable, without status epilepticus: Secondary | ICD-10-CM | POA: Insufficient documentation

## 2013-08-06 DIAGNOSIS — E876 Hypokalemia: Secondary | ICD-10-CM

## 2013-08-06 LAB — POCT CBC
Granulocyte percent: 52.3 %G (ref 37–80)
Hemoglobin: 14.7 g/dL (ref 12.2–16.2)
MID (cbc): 0.4 (ref 0–0.9)
MPV: 7.4 fL (ref 0–99.8)
POC Granulocyte: 3.1 (ref 2–6.9)
POC MID %: 6.7 %M (ref 0–12)
Platelet Count, POC: 342 10*3/uL (ref 142–424)
RBC: 4.75 M/uL (ref 4.04–5.48)

## 2013-08-06 LAB — COMPREHENSIVE METABOLIC PANEL
ALT: 10 U/L (ref 0–35)
AST: 10 U/L (ref 0–37)
Albumin: 4.2 g/dL (ref 3.5–5.2)
CO2: 25 mEq/L (ref 19–32)
Calcium: 9.3 mg/dL (ref 8.4–10.5)
Chloride: 106 mEq/L (ref 96–112)
Creat: 1 mg/dL (ref 0.50–1.10)
Potassium: 3.8 mEq/L (ref 3.5–5.3)
Sodium: 137 mEq/L (ref 135–145)
Total Protein: 7.1 g/dL (ref 6.0–8.3)

## 2013-08-06 MED ORDER — ONDANSETRON HCL 4 MG PO TABS: 4.0000 mg | ORAL_TABLET | Freq: Three times a day (TID) | ORAL | Status: DC | PRN

## 2013-08-06 MED ORDER — ONDANSETRON 4 MG PO TBDP
8.0000 mg | ORAL_TABLET | Freq: Once | ORAL | Status: AC
  Administered 2013-08-06: 8 mg via ORAL

## 2013-08-06 MED ORDER — CYCLOBENZAPRINE HCL 10 MG PO TABS: 10.0000 mg | ORAL_TABLET | Freq: Every day | ORAL | Status: DC

## 2013-08-06 MED ORDER — MELOXICAM 15 MG PO TABS: 15.0000 mg | ORAL_TABLET | Freq: Every day | ORAL | Status: DC

## 2013-08-06 MED ORDER — TRAMADOL HCL 50 MG PO TABS: 50.0000 mg | ORAL_TABLET | Freq: Three times a day (TID) | ORAL | Status: DC | PRN

## 2013-08-06 MED ORDER — ALPRAZOLAM 0.5 MG PO TABS: ORAL_TABLET | ORAL | Status: DC

## 2013-08-06 NOTE — Telephone Encounter (Signed)
Thanks. She was advised, she did come in to office today for this.

## 2013-08-06 NOTE — Progress Notes (Signed)
Subjective:    Patient ID: Tammy [name], female    DOB: 03-Oct-1976, 37 y.o.   MRN: [id number]  HPI Patient with greater than 1 week of headache, states it is now going into her neck. She states feels like tension headache mixed with a migraine. Has nausea/ vomiting, feels this is associated with her new prescription for headache, Fioricet. States she took it and vomited this morning, still has nausea and headache. Although she also has had diarrhea 3-4 times a day over the last 6 days since the headache started. Recent seizure activity with a fall resulting in concussion- Dr Everlena Cooper, of Neurology started on her own Hoyle Sauer she feels better. Has had MRI/ CT done which were all normal. EEG suggested seizure disorder. Has loss of appetite, weight loss of 7 lbs in past week. Notes mild pain of sinus area, occasionally. Using Wellbutrin every other day to wean from this medication, is to discontinue next week. Patient indicates she feels like the depression/ anxiety is better than last year. She is using the Alprazolam to help her sleep, and during the day using as well prn anxiety.   Patient Active Problem List   Diagnosis Date Noted  . HTN (hypertension)--- has been stable on meds  03/14/2013    Priority: Medium  . Seizure disorder 08/06/2013  . Anxiety 10/25/2012  . Anemia 08/31/2012  . Lumbosacral disc disease 07/23/2012  . Status post gastric bypass for obesity 07/23/2012  Current outpatient prescriptions:ALPRAZolam (XANAX) 0.5 MG tablet, 1-2 TABS BID For anxiety or insomnia Calcium Carbonate-Vit D-Min (CALCIUM 1200 PO), Take by mouth Cyanocobalamin (VITAMIN B 12 PO), Take by mouth., Disp: , Rfl: ;   ferrous sulfate 325 (65 FE) MG tablet, Take 325 mg by mouth daily with breakfast., Disp: , Rfl:  levETIRAcetam (KEPPRA) 500 MG tablet, Take 1 tablet (500 mg total) by mouth 2 (two) times daily. lisinopril-hydrochlorothiazide (PRINZIDE,ZESTORETIC) 10-12.5 MG per tablet, Take 1 tablet by mouth  daily Multiple Vitamin (MULTIVITAMIN) tablet, Take 1 tablet by mouth daily., Disp: , Rfl:  cyclobenzaprine (FLEXERIL) 10 MG tablet, Take 1 tablet (10 mg total) by mouth at bedtime. meloxicam (MOBIC) 15 MG tablet, Take 1 tablet (15 mg total) by mouth daily   Review of Systems No fever chills or night sweats No memory loss, confusion, hallucinations Main source stress continues to be  72 year old daughter/#3 in class but headstrong 17yoson to [employer] this week//younger one at home as well No genitourinary symptoms    Objective:   Physical Exam BP 112/74  Pulse 80  Temp(Src) 98.1 F (36.7 C) (Oral)  Resp 16  Ht 5\' 6"  (1.676 m)  Wt 160 lb (72.576 kg)  BMI 25.84 kg/m2  SpO2 99%  LMP 07/16/2013  No acute distress  Appears fatigued  pupils equal round reactive to light and accommodation/EOMs conjugate TMs, nares, oropharynx all clear No nodes or thyromegaly Tenderness neck and upper back to palpation Neck has fair range of motion with mild tendernessin flexion No cyst or motor losses and upper extremities Heart regular without murmur Cranial nerves II through XII intact Romberg negative Gait normal No motor losses Mood is good/affect appropriate        Results for orders placed in visit on 08/06/13  POCT CBC      Result Value Range   WBC 5.9  4.6 - 10.2 K/uL   Lymph, poc 2.4  0.6 - 3.4   POC LYMPH PERCENT 41.0  10 - 50 %L   MID (cbc) 0.4  0 -  0.9   POC MID % 6.7  0 - 12 %M   POC Granulocyte 3.1  2 - 6.9   Granulocyte percent 52.3  37 - 80 %G   RBC 4.75  4.04 - 5.48 M/uL   Hemoglobin 14.7  12.2 - 16.2 g/dL   HCT, POC 16.1  09.6 - 47.9 %   MCV 96.5  80 - 97 fL   MCH, POC 30.9  27 - 31.2 pg   MCHC 32.1  31.8 - 35.4 g/dL   RDW, POC 04.5     Platelet Count, POC 342  142 - 424 K/uL   MPV 7.4  0 - 99.8 fL    Assessment & Plan:  Nausea and vomiting with diarrhea -possible acute viral gastroenteritis but cannot rule out side effects of Fioricet .  Plan Discontinue  fioricet/Zofran for nausea/close followup  Headache nonresponsive to ibuprofen and Tylenol-unclear if related to recent concussion  Plan-will now Flexeril at bedtime/tramadol as needed for pain for the next few days/neck range of motion//meloxicam   anxiety disorder-discontinue Wellbutrin/continue Xanax 1-2 twice a day as needed/recheck 1-2 months  New onset seizure disorder-Dr Jaffe/Keppra  Last labs had low potassium so will recheck  Meds ordered this encounter  Medications  . ondansetron (ZOFRAN-ODT) disintegrating tablet 8 mg--given in the office with success     Sig:   . ondansetron (ZOFRAN) 4 MG tablet    Sig: Take 1 tablet (4 mg total) by mouth every 8 (eight) hours as needed for nausea.    Dispense:  20 tablet    Refill:  0  . cyclobenzaprine (FLEXERIL) 10 MG tablet    Sig: Take 1 tablet (10 mg total) by mouth at bedtime.    Dispense:  30 tablet    Refill:  0  . traMADol (ULTRAM) 50 MG tablet    Sig: Take 1-2 tablets (50-100 mg total) by mouth every 8 (eight) hours as needed for pain. HEADACHE    Dispense:  30 tablet    Refill:  0  . meloxicam (MOBIC) 15 MG tablet    Sig: Take 1 tablet (15 mg total) by mouth daily.    Dispense:  30 tablet    Refill:  1  . ALPRAZolam (XANAX) 0.5 MG tablet    Sig: 1-2 TABS BID For anxiety or insomnia    Dispense:  120 tablet    Refill:  1

## 2013-08-13 ENCOUNTER — Other Ambulatory Visit: Payer: Self-pay | Admitting: Physician Assistant

## 2013-08-27 ENCOUNTER — Other Ambulatory Visit: Payer: Self-pay | Admitting: Internal Medicine

## 2013-08-28 ENCOUNTER — Other Ambulatory Visit: Payer: Self-pay

## 2013-08-28 NOTE — Telephone Encounter (Signed)
Meds ordered this encounter  Medications  . traMADol (ULTRAM) 50 MG tablet    Sig: TAKE 1 TO 2 TABLETS BY MOUTH EVERY 8 HOURS AS NEEDED FOR PAIN/ HEADACHE    Dispense:  30 tablet    Refill:  0  . ondansetron (ZOFRAN) 4 MG tablet    Sig: TAKE 1 TABLET BY MOUTH EVERY 8 HOURS AS NEEDED FOR NAUSEA    Dispense:  20 tablet    Refill:  0

## 2013-09-05 ENCOUNTER — Ambulatory Visit (INDEPENDENT_AMBULATORY_CARE_PROVIDER_SITE_OTHER): Payer: Federal, State, Local not specified - PPO | Admitting: Internal Medicine

## 2013-09-05 ENCOUNTER — Encounter: Payer: Self-pay | Admitting: Internal Medicine

## 2013-09-05 VITALS — BP 130/82 | HR 62 | Temp 98.5°F | Resp 16 | Ht 66.5 in | Wt 160.0 lb

## 2013-09-05 DIAGNOSIS — Z23 Encounter for immunization: Secondary | ICD-10-CM

## 2013-09-05 DIAGNOSIS — M542 Cervicalgia: Secondary | ICD-10-CM

## 2013-09-05 DIAGNOSIS — M25512 Pain in left shoulder: Secondary | ICD-10-CM

## 2013-09-05 DIAGNOSIS — R519 Headache, unspecified: Secondary | ICD-10-CM

## 2013-09-05 DIAGNOSIS — R51 Headache: Secondary | ICD-10-CM

## 2013-09-05 DIAGNOSIS — M25519 Pain in unspecified shoulder: Secondary | ICD-10-CM

## 2013-09-05 MED ORDER — CYCLOBENZAPRINE HCL 10 MG PO TABS: 10.0000 mg | ORAL_TABLET | Freq: Every day | ORAL | Status: DC

## 2013-09-05 NOTE — Progress Notes (Signed)
  Subjective:    Patient ID: Tammy [name], female    DOB: 11/04/1976, 37 y.o.   MRN: [id number]  HPI No more seizures. Continues to have headaches. Neck still stays tight and feels like shoulder is rotated forward. Headaches start at back left neck. Not sure if from seizure/fall. Hit shoulder when she fell. Prior to this, headaches didn't start in neck. Is going to have a chiropractic adjustment.  Still having to take tylenol for headaches. Tramadol used if bad headache with relief.  Sleeping well now with flexeril.  Has been working on neck ROM.  Occasional left lateral hand tingling with work or sleeping on it funny.  Taking Keppra for seizures. This relieves her anxiety about having another seizure. EEG with abnormality to strobe light   Review of Systems Off Wellbutrin and did not complain of anxiety    Objective:   Physical Exam  Constitutional: She is oriented to person, place, and time. She appears well-developed and well-nourished.  Neurological: She is alert and oriented to person, place, and time.  Skin: Skin is warm and dry.  PERRLA w/ EOMs conjugate Neck ROM limited by discomfort No radicular signs Tender along paracervical muscles Left shoulder tender over deltoid with pain on external rotation and abduction against resistance. Cranial nerves II-XII intact, DTRs symmetrical, cerebellar intact    Assessment & Plan:  HA (headache)  Need for prophylactic vaccination and inoculation against influenza - Plan: Flu Vaccine QUAD 36+ mos IM  Neck pain - Plan: Ambulatory referral to Physical Therapy  Shoulder pain, acute, left - Plan: Ambulatory referral to Physical Therapy  Meds ordered this encounter  Medications  . cyclobenzaprine (FLEXERIL) 10 MG tablet    Sig: Take 1 tablet (10 mg total) by mouth at bedtime.    Dispense:  30 tablet    Refill:  0

## 2013-10-02 ENCOUNTER — Ambulatory Visit: Payer: Federal, State, Local not specified - PPO

## 2013-10-08 ENCOUNTER — Other Ambulatory Visit: Payer: Self-pay | Admitting: Internal Medicine

## 2013-10-09 ENCOUNTER — Ambulatory Visit: Payer: Federal, State, Local not specified - PPO | Admitting: Physical Therapy

## 2013-11-05 ENCOUNTER — Telehealth: Payer: Self-pay

## 2013-11-05 MED ORDER — ALPRAZOLAM 0.5 MG PO TABS: ORAL_TABLET | ORAL | Status: DC

## 2013-11-05 NOTE — Telephone Encounter (Signed)
Pended please advise.  

## 2013-11-05 NOTE — Telephone Encounter (Signed)
Meds ordered this encounter  Medications  . ALPRAZolam (XANAX) 0.5 MG tablet    Sig: TAKE 1 TO 2 TABLETS BY MOUTH TWICE DAILY AS NEEDED FOR ANXIETY OR INSOMNIA    Dispense:  120 tablet    Refill:  0

## 2013-11-05 NOTE — Telephone Encounter (Signed)
Pt requesting rx refill for xanax   Best phone for pt is [phone]  Pharmacy [facility]

## 2013-11-27 ENCOUNTER — Other Ambulatory Visit: Payer: Self-pay | Admitting: Internal Medicine

## 2013-12-04 ENCOUNTER — Other Ambulatory Visit: Payer: Self-pay | Admitting: Internal Medicine

## 2014-01-01 ENCOUNTER — Other Ambulatory Visit: Payer: Self-pay | Admitting: Internal Medicine

## 2014-01-03 ENCOUNTER — Other Ambulatory Visit: Payer: Self-pay | Admitting: Internal Medicine

## 2014-01-03 NOTE — Telephone Encounter (Signed)
Called in.

## 2014-02-06 ENCOUNTER — Encounter: Payer: Self-pay | Admitting: Internal Medicine

## 2014-02-06 ENCOUNTER — Ambulatory Visit (INDEPENDENT_AMBULATORY_CARE_PROVIDER_SITE_OTHER): Payer: Federal, State, Local not specified - PPO | Admitting: Internal Medicine

## 2014-02-06 VITALS — BP 110/80 | HR 68 | Temp 98.5°F | Resp 16 | Ht 66.0 in | Wt 169.4 lb

## 2014-02-06 DIAGNOSIS — F419 Anxiety disorder, unspecified: Secondary | ICD-10-CM

## 2014-02-06 DIAGNOSIS — M519 Unspecified thoracic, thoracolumbar and lumbosacral intervertebral disc disorder: Secondary | ICD-10-CM

## 2014-02-06 DIAGNOSIS — F411 Generalized anxiety disorder: Secondary | ICD-10-CM

## 2014-02-06 DIAGNOSIS — D649 Anemia, unspecified: Secondary | ICD-10-CM

## 2014-02-06 DIAGNOSIS — I10 Essential (primary) hypertension: Secondary | ICD-10-CM

## 2014-02-06 DIAGNOSIS — G40909 Epilepsy, unspecified, not intractable, without status epilepticus: Secondary | ICD-10-CM

## 2014-02-06 MED ORDER — LISINOPRIL-HYDROCHLOROTHIAZIDE 10-12.5 MG PO TABS: 1.0000 | ORAL_TABLET | Freq: Every day | ORAL | Status: DC

## 2014-02-06 MED ORDER — ALPRAZOLAM 0.5 MG PO TABS: ORAL_TABLET | ORAL | Status: DC

## 2014-02-06 NOTE — Progress Notes (Signed)
Subjective:    Patient ID: Tammy [name], female    DOB: Oct 20, 1976, 38 y.o.   MRN: [id number] This chart was scribed for Ellamae Sia, MD by Valera Castle, ED Scribe. This patient was seen in room 24 and the patient's care was started at 2:43 PM.  Chief Complaint  Patient presents with   Follow-up     Xanax 0.5 mg, pt will need med refill   HPI HPI Comments: Tammy [name] is a 38 y.o. female Pt presents for a Xanax follow up. She was seen here Fall 2014.   She states her back pain is manageable. She has been stretching, meditating. She no longer takes Flexeril. She reports 10-15 lb gain, wonders if being off Wellbutrin is the cause. She denies diet change. She doesn't feel unhealthy. She has cut back on soda intake, tried to eat healthier. She is still on Keppra, states that is working well. She takes Alprazolam once in the morning, and sometimes 2 in the evenings. She is sleeping well. She reports her periods have picked up, but are normal. She denies significant headaches in past month. She has noticed a significant reduction in her stress level by taking a LOA for [employer] , states she will probably switch to part time work. She will be  a grandmother in July--69yo daughter. 2 other kids leaving for college this year. !3yo at home.  PCP - Tonye Pearson, MD  Patient Active Problem List   Diagnosis Date Noted   Seizure disorder 08/06/2013   HTN (hypertension) 03/14/2013   Anxiety 10/25/2012   Anemia 08/31/2012   Lumbosacral disc disease 07/23/2012   Status post gastric bypass for obesity 07/23/2012   Prior to Admission medications   Medication Sig Start Date End Date Taking? Authorizing Provider  ALPRAZolam (XANAX) 0.5 MG tablet TAKE 1 TO 2 TABLETS BY MOUTH TWICE DAILY AS NEEDED FOR ANXIETY OR INSOMNIA 01/01/14  Yes Tonye Pearson, MD  Calcium Carbonate-Vit D-Min (CALCIUM 1200 PO) Take by mouth.   Yes Historical Provider, MD  Cyanocobalamin (VITAMIN B 12  PO) Take by mouth.   Yes Historical Provider, MD  ferrous sulfate 325 (65 FE) MG tablet Take 325 mg by mouth daily with breakfast.   Yes Historical Provider, MD  levETIRAcetam (KEPPRA) 500 MG tablet Take 1 tablet (500 mg total) by mouth 2 (two) times daily. 07/27/13  Yes Adam Gus Rankin, DO  lisinopril-hydrochlorothiazide (PRINZIDE,ZESTORETIC) 10-12.5 MG per tablet Take 1 tablet by mouth daily. 03/14/13  Yes Tonye Pearson, MD  Multiple Vitamin (MULTIVITAMIN) tablet Take 1 tablet by mouth daily.   Yes Historical Provider, MD  cyclobenzaprine (FLEXERIL) 10 MG tablet TAKE 1 TABLET BY MOUTH AT BEDTIME 11/27/13   Tonye Pearson, MD  meloxicam (MOBIC) 15 MG tablet TAKE 1 TABLET BY MOUTH EVERY DAY 01/03/14   Tonye Pearson, MD  ondansetron (ZOFRAN) 4 MG tablet TAKE 1 TABLET BY MOUTH EVERY 8 HOURS AS NEEDED FOR NAUSEA 10/08/13   Tonye Pearson, MD  traMADol (ULTRAM) 50 MG tablet TAKE 1 TO 2 TABLETS BY MOUTH EVERY 8 HOURS AS NEEDED FOR HEADACHE PAIN 11/27/13   Tonye Pearson, MD   Review of Systems  Musculoskeletal: Positive for back pain (lower).  Neurological: Negative for headaches.  Psychiatric/Behavioral: Negative for sleep disturbance and dysphoric mood. The patient is not nervous/anxious.   menses very light post ablation  BP 110/80   Pulse 68   Temp(Src) 98.5 F (36.9 C) (Oral)   Resp  16   Ht 5\' 6"  (1.676 m)   Wt 169 lb 6.4 oz (76.839 kg)   BMI 27.35 kg/m2   SpO2 99%   LMP 02/02/2014     Objective:   Physical Exam  Nursing note and vitals reviewed. Constitutional: She is oriented to person, place, and time. She appears well-developed and well-nourished. No distress.  HENT:  Head: Normocephalic and atraumatic.  Eyes: EOM are normal.  Neck: Neck supple. Carotid bruit is not present. No thyromegaly present.  Cardiovascular: Normal rate, regular rhythm and normal heart sounds.   No murmur heard. Pulmonary/Chest: Effort normal and breath sounds normal. No respiratory  distress. She has no wheezes. She has no rales.  Musculoskeletal: Normal range of motion.  Lymphadenopathy:    She has no cervical adenopathy.  Neurological: She is alert and oriented to person, place, and time.  Skin: Skin is warm and dry.  Psychiatric: She has a normal mood and affect. Her behavior is normal.      Assessment & Plan:  HTN (hypertension)-stable  Anemia-DUB resolved  Anxiety-stable off wellbutr w/ prn alpraz plus decr work  Lumbosacral disc disease--impr w/ yoga  Seizure disorder-stable on keppra  Meds ordered this encounter  Medications   ALPRAZolam (XANAX) 0.5 MG tablet    Sig: TAKE 1 TO 2 TABLETS BY MOUTH TWICE DAILY AS NEEDED FOR ANXIETY OR INSOMNIA    Dispense:  120 tablet    Refill:  5   lisinopril-hydrochlorothiazide (PRINZIDE,ZESTORETIC) 10-12.5 MG per tablet    Sig: Take 1 tablet by mouth daily.    Dispense:  90 tablet    Refill:  3   F/u 6 mos w/ labs    I have completed the patient encounter in its entirety as documented by the scribe, with editing by me where necessary. Robert P. Merla Richesoolittle, M.D.

## 2014-03-02 ENCOUNTER — Other Ambulatory Visit: Payer: Self-pay | Admitting: Internal Medicine

## 2014-03-11 ENCOUNTER — Other Ambulatory Visit: Payer: Self-pay | Admitting: Internal Medicine

## 2014-05-10 IMAGING — CR DG CHEST 2V
2 series · 2 of 2 positions shown · non-contrast
Comparison: 09/01/2004

CLINICAL DATA: History of seizures, post fall

CHEST - 2 VIEW

[w chest pa]
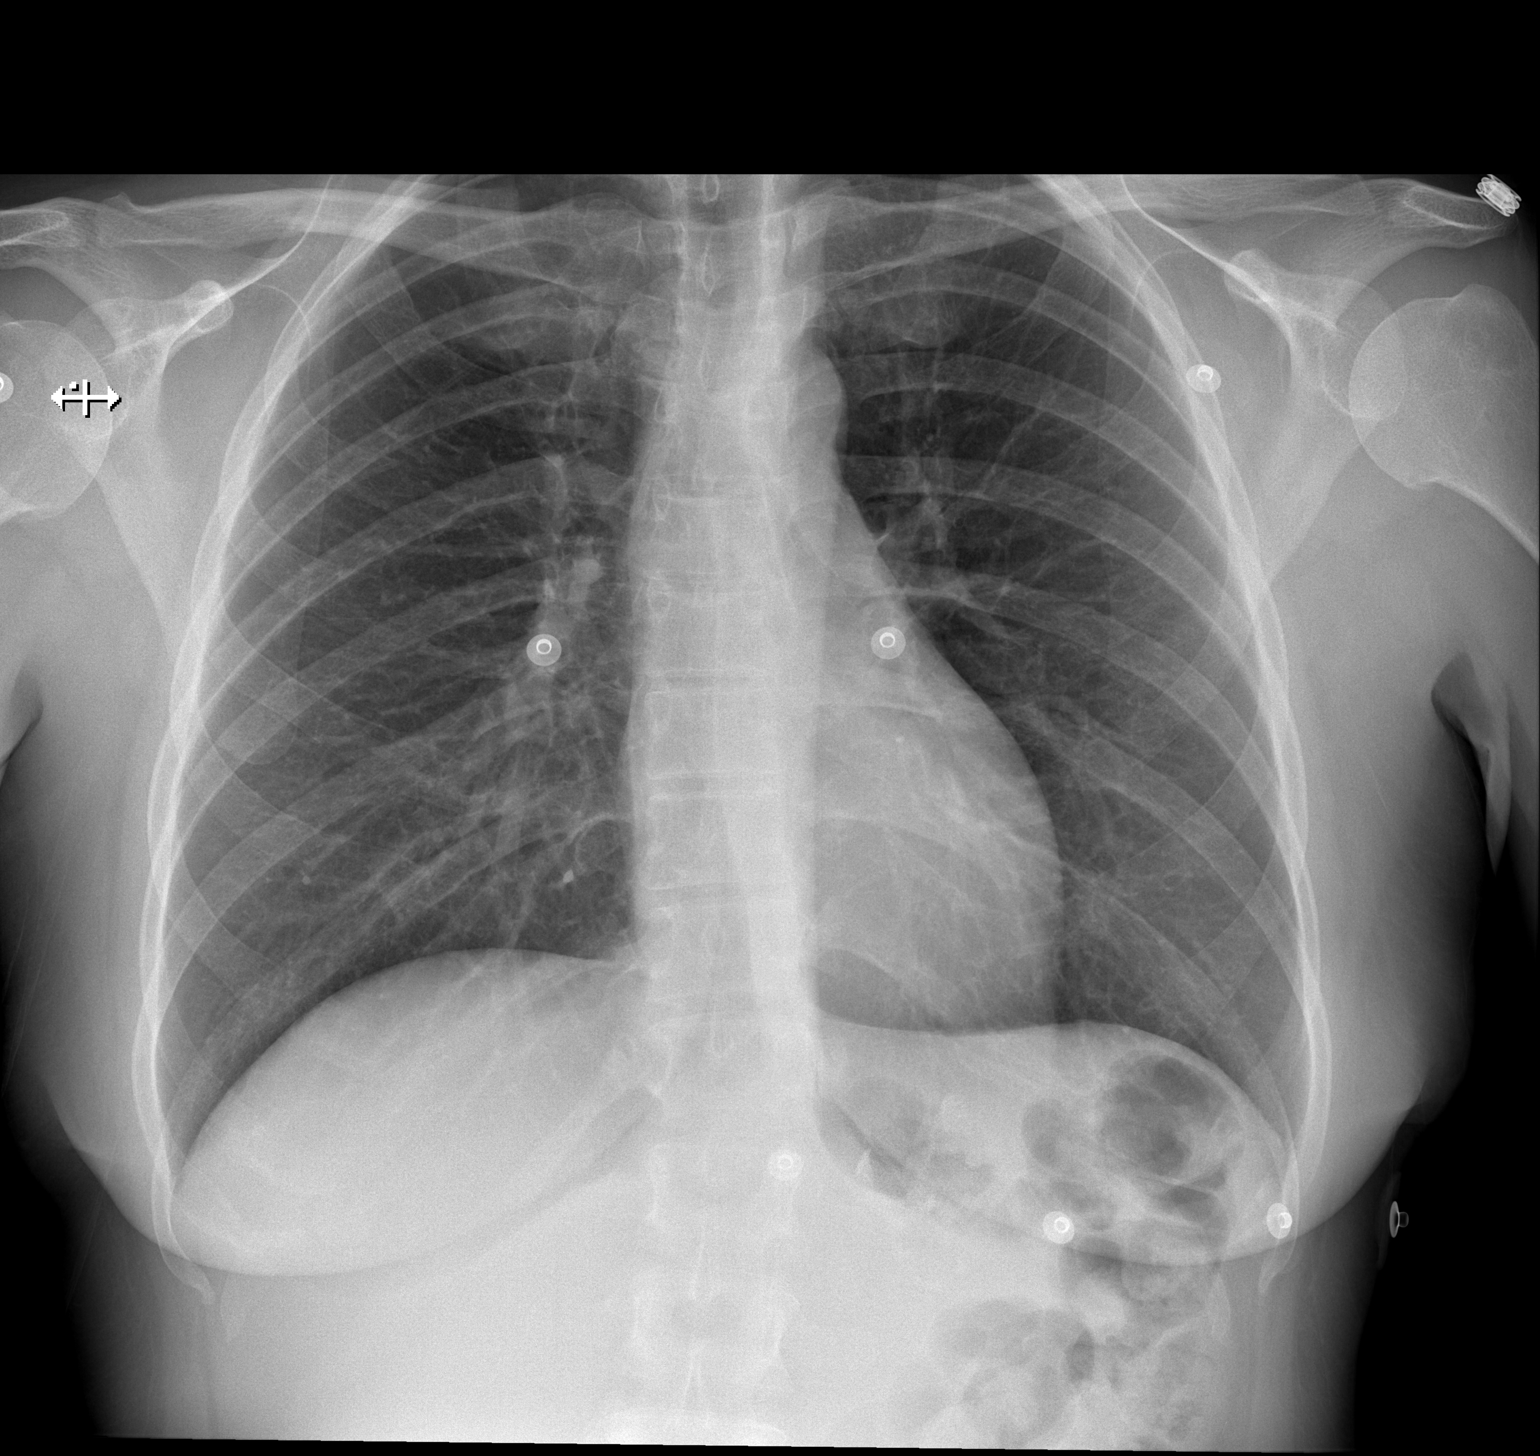

[w chest lat]
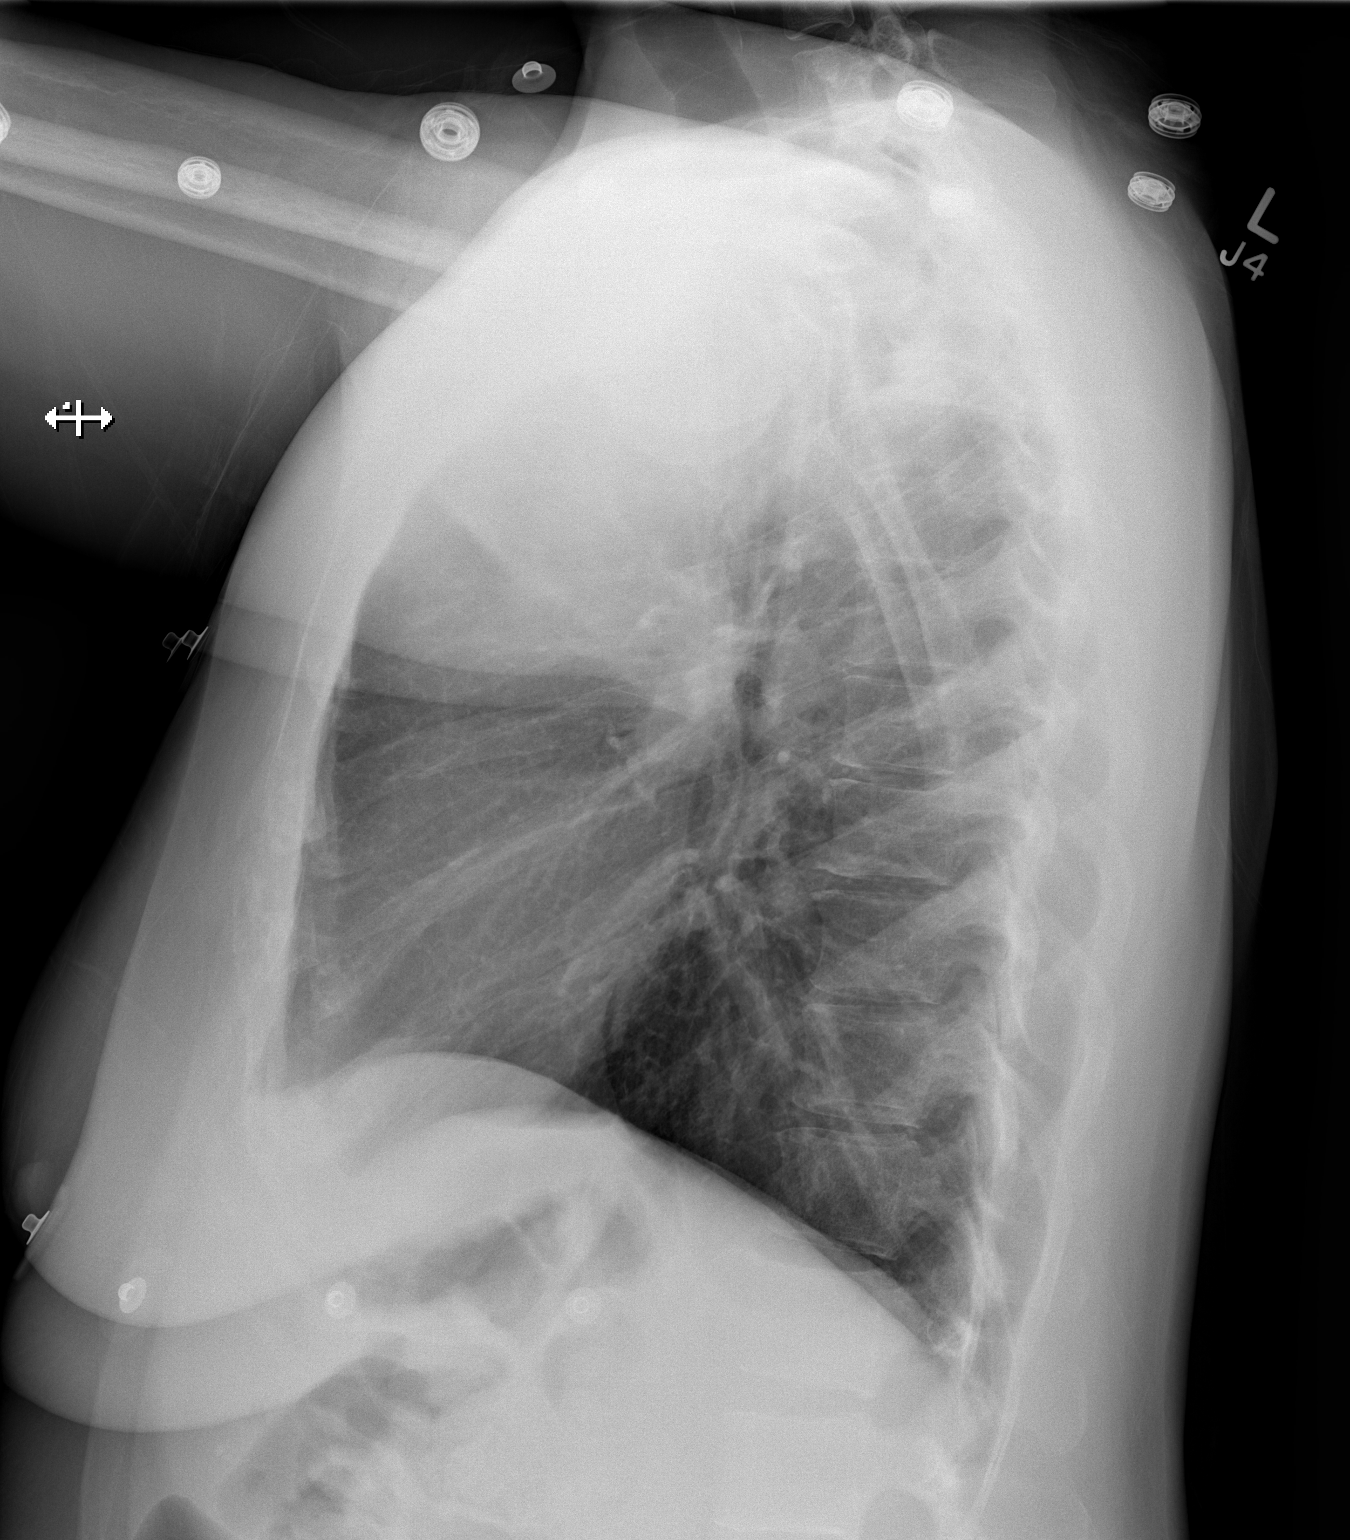

[2 of 2 positions shown; findings below may reference images not displayed]

FINDINGS: Normal cardiac silhouette and mediastinal contours.
Evaluation of the retrosternal clear space is obscured secondary to
overlying soft tissues.  No focal airspace opacities.  No pleural
effusion or pneumothorax.  Unchanged bones.
IMPRESSION: No acute cardiopulmonary disease.

## 2014-07-21 ENCOUNTER — Other Ambulatory Visit: Payer: Self-pay | Admitting: Internal Medicine

## 2014-07-23 NOTE — Telephone Encounter (Signed)
faxed

## 2014-07-31 ENCOUNTER — Other Ambulatory Visit: Payer: Self-pay | Admitting: Internal Medicine

## 2014-08-14 ENCOUNTER — Encounter: Payer: Self-pay | Admitting: Internal Medicine

## 2014-08-14 ENCOUNTER — Ambulatory Visit (INDEPENDENT_AMBULATORY_CARE_PROVIDER_SITE_OTHER): Payer: Federal, State, Local not specified - PPO | Admitting: Internal Medicine

## 2014-08-14 VITALS — BP 130/100 | HR 98 | Temp 99.1°F | Resp 16 | Ht 66.0 in | Wt 175.4 lb

## 2014-08-14 DIAGNOSIS — R51 Headache: Secondary | ICD-10-CM

## 2014-08-14 DIAGNOSIS — F419 Anxiety disorder, unspecified: Secondary | ICD-10-CM

## 2014-08-14 DIAGNOSIS — M519 Unspecified thoracic, thoracolumbar and lumbosacral intervertebral disc disorder: Secondary | ICD-10-CM

## 2014-08-14 DIAGNOSIS — G40909 Epilepsy, unspecified, not intractable, without status epilepticus: Secondary | ICD-10-CM

## 2014-08-14 DIAGNOSIS — Z9884 Bariatric surgery status: Secondary | ICD-10-CM

## 2014-08-14 DIAGNOSIS — I10 Essential (primary) hypertension: Secondary | ICD-10-CM

## 2014-08-14 DIAGNOSIS — R519 Headache, unspecified: Secondary | ICD-10-CM

## 2014-08-14 DIAGNOSIS — F411 Generalized anxiety disorder: Secondary | ICD-10-CM

## 2014-08-14 MED ORDER — ONDANSETRON HCL 4 MG PO TABS: ORAL_TABLET | ORAL | Status: DC

## 2014-08-14 MED ORDER — ALPRAZOLAM 0.5 MG PO TABS: ORAL_TABLET | ORAL | Status: DC

## 2014-08-14 MED ORDER — LISINOPRIL-HYDROCHLOROTHIAZIDE 10-12.5 MG PO TABS: 1.0000 | ORAL_TABLET | Freq: Every day | ORAL | Status: DC

## 2014-08-14 MED ORDER — CYCLOBENZAPRINE HCL 10 MG PO TABS: 10.0000 mg | ORAL_TABLET | Freq: Every day | ORAL | Status: DC

## 2014-08-14 NOTE — Progress Notes (Signed)
Subjective:    Patient ID: Tammy [name], female    DOB: 05-23-1976, 38 y.o.   MRN: [id number]  This chart was scribed for Tonye Pearson, MD by Tonye Royalty, ED Scribe. This patient was seen in room 26 and the patient's care was started at 1:02 PM.   HPI  HPI Comments: Tammy [name] is a 38 y.o. female who presents to the [facility] for follow up complaining of back pain. She reports using ice, stretching, and massages without remission. She also reports headaches. She states she is recently a grandmother and has 2 children in college now, one at [place] and one at [place]. 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 She agrees with my suggestion to work on shedding a few pounds; she states she has a problem with eating at irregular times and with snacking late in the evening.   Prior to Admission medications   Medication Sig Start Date End Date Taking? Authorizing Provider  ALPRAZolam Prudy Feeler) 0.5 MG tablet TAKE 1-2 TABLETS BY MOUTH TWICE DAILY AS NEEDED FOR ANXIETY OR INSOMNIA 07/22/14  Yes Tonye Pearson, MD  Calcium Carbonate-Vit D-Min (CALCIUM 1200 PO) Take by mouth.   Yes Historical Provider, MD  Cyanocobalamin (VITAMIN B 12 PO) Take by mouth.   Yes Historical Provider, MD  cyclobenzaprine (FLEXERIL) 10 MG tablet Take 1 tablet (10 mg total) by mouth at bedtime. PATIENT NEEDS OFFICE VISIT FOR ADDITIONAL REFILLS 07/31/14  Yes Tonye Pearson, MD  ferrous sulfate 325 (65 FE) MG tablet Take 325 mg by mouth daily with breakfast.   Yes Historical Provider, MD  levETIRAcetam (KEPPRA) 500 MG tablet Take 1 tablet (500 mg total) by mouth 2 (two) times daily. 07/27/13  Yes Adam Gus Rankin, DO  lisinopril-hydrochlorothiazide (PRINZIDE,ZESTORETIC) 10-12.5 MG per tablet Take 1 tablet by mouth daily. 02/06/14  Yes Tonye Pearson, MD  Multiple Vitamin (MULTIVITAMIN) tablet Take 1 tablet by mouth daily.   Yes Historical Provider, MD    ondansetron (ZOFRAN) 4 MG tablet TAKE 1 TABLET BY MOUTH EVERY 8 HOURS AS NEEDED FOR NAUSEA 10/08/13  Yes Tonye Pearson, MD  meloxicam (MOBIC) 15 MG tablet TAKE 1 TABLET BY MOUTH EVERY DAY    Tonye Pearson, MD  traMADol (ULTRAM) 50 MG tablet TAKE 1 TO 2 TABLETS BY MOUTH EVERY 8 HOURS AS NEEDED FOR HEADACHE PAIN 11/27/13   Tonye Pearson, MD   Review of Systems  Musculoskeletal: Positive for back pain.  Neurological: Positive for headaches.       Objective:   Physical Exam  Nursing note and vitals reviewed. Constitutional: She is oriented to person, place, and time. She appears well-developed and well-nourished.  HENT:  Head: Normocephalic and atraumatic.  Eyes: Conjunctivae and EOM are normal. Pupils are equal, round, and reactive to light.  Neck: Normal range of motion. Neck supple. No thyromegaly present.  Cardiovascular: Normal rate, regular rhythm and normal heart sounds.   No murmur heard. Pulmonary/Chest: Effort normal and breath sounds normal. No respiratory distress. She has no wheezes.  Musculoskeletal: Normal range of motion.  Lymphadenopathy:    She has no cervical adenopathy.  Neurological: She is alert and oriented to person, place, and time.  Skin: Skin is warm and dry.  Psychiatric: She has a normal mood and affect.          Assessment & Plan:   I have completed the patient encounter in its entirety as documented by  the scribe, with editing by me where necessary. Lanell Carpenter P. Merla Riches, M.D. Essential hypertension  Status post gastric bypass for obesity  Anxiety  Lumbosacral disc disease  Seizure disorder  Nonintractable headache, unspecified chronicity pattern, unspecified headache type  Meds ordered this encounter  Medications  . ondansetron (ZOFRAN) 4 MG tablet    Sig: TAKE 1 TABLET BY MOUTH EVERY 8 HOURS AS NEEDED FOR NAUSEA    Dispense:  20 tablet    Refill:  5  . lisinopril-hydrochlorothiazide (PRINZIDE,ZESTORETIC) 10-12.5 MG per  tablet    Sig: Take 1 tablet by mouth daily.    Dispense:  90 tablet    Refill:  3  . ALPRAZolam (XANAX) 0.5 MG tablet    Sig: TAKE 1-2 TABLETS BY MOUTH TWICE DAILY AS NEEDED FOR ANXIETY OR INSOMNIA    Dispense:  60 tablet    Refill:  5  . cyclobenzaprine (FLEXERIL) 10 MG tablet    Sig: Take 1 tablet (10 mg total) by mouth at bedtime.    Dispense:  90 tablet    Refill:  3   3-6 mos

## 2014-08-16 ENCOUNTER — Telehealth: Payer: Self-pay | Admitting: Neurology

## 2014-08-16 ENCOUNTER — Ambulatory Visit: Payer: Federal, State, Local not specified - PPO | Admitting: Neurology

## 2014-08-16 NOTE — Telephone Encounter (Signed)
Pt called to r/s her f/u appt for today 08/16/14 due to her having to go out of town due to an emergency.  She r/s to 09/10/14.

## 2014-09-11 ENCOUNTER — Ambulatory Visit: Payer: Federal, State, Local not specified - PPO | Admitting: Neurology

## 2014-09-12 ENCOUNTER — Telehealth: Payer: Self-pay | Admitting: Neurology

## 2014-09-12 NOTE — Telephone Encounter (Signed)
Pt no showed appt 09/11/14 for follow up. This is the 2nd no show within less than 30 days.  Alcario DroughtErica - please send pt a no show letter / Oneita KrasSherri S.

## 2014-09-16 ENCOUNTER — Encounter: Payer: Self-pay | Admitting: *Deleted

## 2014-09-16 NOTE — Progress Notes (Signed)
No show letter sent for 10/07/201

## 2014-10-07 ENCOUNTER — Encounter: Payer: Self-pay | Admitting: Internal Medicine

## 2014-10-25 ENCOUNTER — Other Ambulatory Visit: Payer: Self-pay | Admitting: Internal Medicine

## 2014-10-26 ENCOUNTER — Other Ambulatory Visit: Payer: Self-pay | Admitting: Internal Medicine

## 2014-11-07 ENCOUNTER — Other Ambulatory Visit: Payer: Self-pay | Admitting: Internal Medicine

## 2014-11-09 ENCOUNTER — Telehealth: Payer: Self-pay

## 2014-11-09 NOTE — Telephone Encounter (Signed)
Pt requesting refill on ALPRAZolam (XANAX) 0.5 MG tablet [[id number]][[id number]]

## 2014-11-09 NOTE — Telephone Encounter (Signed)
Pt notified that rx was faxed to pharmacy

## 2014-11-19 ENCOUNTER — Telehealth: Payer: Self-pay

## 2014-11-19 DIAGNOSIS — G4700 Insomnia, unspecified: Secondary | ICD-10-CM

## 2014-11-19 DIAGNOSIS — F419 Anxiety disorder, unspecified: Secondary | ICD-10-CM

## 2014-11-19 NOTE — Telephone Encounter (Signed)
REFILL REQUEST   ALPRAZolam (XANAX) 0.5 MG tablet   [phone]

## 2014-11-19 NOTE — Telephone Encounter (Signed)
Ok to call in

## 2014-11-20 MED ORDER — ALPRAZOLAM 0.5 MG PO TABS: ORAL_TABLET | ORAL | Status: DC

## 2014-11-20 NOTE — Telephone Encounter (Signed)
Called in medication per dr Henderson Newcomerdoolittle Spoke to pt, she is aware of refill.

## 2014-11-23 ENCOUNTER — Other Ambulatory Visit: Payer: Self-pay | Admitting: Physician Assistant

## 2014-12-04 ENCOUNTER — Telehealth: Payer: Self-pay

## 2014-12-04 DIAGNOSIS — G4700 Insomnia, unspecified: Secondary | ICD-10-CM

## 2014-12-04 DIAGNOSIS — F419 Anxiety disorder, unspecified: Secondary | ICD-10-CM

## 2014-12-04 NOTE — Telephone Encounter (Signed)
Refill of alprazolam

## 2014-12-05 NOTE — Telephone Encounter (Signed)
Okay to call in. 

## 2014-12-06 ENCOUNTER — Other Ambulatory Visit: Payer: Self-pay | Admitting: Radiology

## 2014-12-06 ENCOUNTER — Other Ambulatory Visit: Payer: Self-pay

## 2014-12-06 DIAGNOSIS — G4700 Insomnia, unspecified: Secondary | ICD-10-CM

## 2014-12-06 DIAGNOSIS — F419 Anxiety disorder, unspecified: Secondary | ICD-10-CM

## 2014-12-06 MED ORDER — ALPRAZOLAM 0.5 MG PO TABS: ORAL_TABLET | ORAL | Status: DC

## 2014-12-06 NOTE — Telephone Encounter (Signed)
Tried to call pt but phone does not take incoming calls. Hopefully pt will check with pharmacy.

## 2014-12-06 NOTE — Telephone Encounter (Signed)
Called in Rx. Tried to call

## 2014-12-06 NOTE — Telephone Encounter (Signed)
Printed. Pt has not been notified, waiting for signature from Dr. Theotis Barrio.

## 2014-12-06 NOTE — Telephone Encounter (Signed)
Called in the Xanax, it printed for her.

## 2014-12-18 ENCOUNTER — Telehealth: Payer: Self-pay

## 2014-12-18 NOTE — Telephone Encounter (Signed)
Pt would like a refill on ALPRAZolam (XANAX) 0.5 MG tablet [[id number].  The pharmacy [facility] on [address]. Please advise at [phone]

## 2014-12-18 NOTE — Telephone Encounter (Signed)
Ok to call in refill x 1

## 2014-12-19 NOTE — Telephone Encounter (Signed)
Med phoned in. Unable to leave message, message states - subscriber not in service

## 2015-01-02 ENCOUNTER — Other Ambulatory Visit: Payer: Self-pay

## 2015-01-02 MED ORDER — MELOXICAM 15 MG PO TABS: 15.0000 mg | ORAL_TABLET | Freq: Every day | ORAL | Status: DC

## 2015-01-15 ENCOUNTER — Other Ambulatory Visit: Payer: Self-pay

## 2015-01-15 DIAGNOSIS — G4700 Insomnia, unspecified: Secondary | ICD-10-CM

## 2015-01-15 DIAGNOSIS — F419 Anxiety disorder, unspecified: Secondary | ICD-10-CM

## 2015-01-15 NOTE — Telephone Encounter (Signed)
Pt would like a refill on her ALPRAZolam (XANAX) 0.5 MG tablet [[id number]][[id number]]. CB#[phone]

## 2015-01-16 MED ORDER — ALPRAZOLAM 0.5 MG PO TABS: ORAL_TABLET | ORAL | Status: DC

## 2015-01-17 NOTE — Telephone Encounter (Signed)
Called in Rx and notified pt on VM. 

## 2015-01-20 ENCOUNTER — Other Ambulatory Visit: Payer: Self-pay | Admitting: Family Medicine

## 2015-01-20 MED ORDER — LEVETIRACETAM 500 MG PO TABS: 500.0000 mg | ORAL_TABLET | Freq: Two times a day (BID) | ORAL | Status: DC

## 2015-01-30 ENCOUNTER — Telehealth: Payer: Self-pay

## 2015-01-30 MED ORDER — ALPRAZOLAM 1 MG PO TABS: 1.0000 mg | ORAL_TABLET | Freq: Two times a day (BID) | ORAL | Status: DC | PRN

## 2015-01-30 NOTE — Telephone Encounter (Signed)
Okay to call in one refill for alprazolam

## 2015-01-30 NOTE — Telephone Encounter (Signed)
Refill on Alprazolam. [facility] [address]   [phone]

## 2015-01-30 NOTE — Telephone Encounter (Signed)
I will call in

## 2015-01-30 NOTE — Telephone Encounter (Signed)
Called in Rx, tried to call pt but phone is off.

## 2015-01-31 ENCOUNTER — Ambulatory Visit: Payer: Federal, State, Local not specified - PPO | Admitting: Neurology

## 2015-02-04 ENCOUNTER — Telehealth: Payer: Self-pay | Admitting: Neurology

## 2015-02-04 ENCOUNTER — Ambulatory Visit: Payer: Federal, State, Local not specified - PPO | Admitting: Neurology

## 2015-02-04 NOTE — Telephone Encounter (Signed)
Pt called the day off her 01/31/15 appt w/ Dr. Everlena CooperJaffe to r/s. Appt marked as a no show b/c pt did not give 24hrs prior notification but a no show letter will not be sent as pt has already r/s / Sherri S.

## 2015-02-05 ENCOUNTER — Telehealth: Payer: Self-pay | Admitting: Neurology

## 2015-02-05 NOTE — Telephone Encounter (Signed)
Pt no showed 02/04/15 appt w/ Dr. Everlena CooperJaffe. This is the 4th no show with Dr. Everlena CooperJaffe since 9/15. How would you like to proceed? Please advise-Sherri

## 2015-02-12 ENCOUNTER — Telehealth: Payer: Self-pay

## 2015-02-12 ENCOUNTER — Ambulatory Visit: Payer: Federal, State, Local not specified - PPO | Admitting: Internal Medicine

## 2015-02-12 DIAGNOSIS — F419 Anxiety disorder, unspecified: Secondary | ICD-10-CM

## 2015-02-12 DIAGNOSIS — G4700 Insomnia, unspecified: Secondary | ICD-10-CM

## 2015-02-12 MED ORDER — ALPRAZOLAM 0.5 MG PO TABS: ORAL_TABLET | ORAL | Status: DC

## 2015-02-12 NOTE — Telephone Encounter (Signed)
Dr. Merla Richesoolittle, is pt on 1mg  or 0.5mg . Please advise.

## 2015-02-12 NOTE — Telephone Encounter (Signed)
ok 

## 2015-02-12 NOTE — Telephone Encounter (Signed)
rx refill for alprazolam to last until her appt

## 2015-02-13 NOTE — Telephone Encounter (Signed)
Rx called in. Pt notified. 

## 2015-02-27 ENCOUNTER — Other Ambulatory Visit: Payer: Self-pay

## 2015-02-27 DIAGNOSIS — G4700 Insomnia, unspecified: Secondary | ICD-10-CM

## 2015-02-27 DIAGNOSIS — F419 Anxiety disorder, unspecified: Secondary | ICD-10-CM

## 2015-02-27 NOTE — Telephone Encounter (Signed)
REFILL REQUEST ALPRAZolam (XANAX) 0.5 MG tablet

## 2015-02-28 MED ORDER — ALPRAZOLAM 0.5 MG PO TABS: ORAL_TABLET | ORAL | Status: DC

## 2015-02-28 NOTE — Telephone Encounter (Signed)
Rx faxed

## 2015-03-05 ENCOUNTER — Ambulatory Visit (INDEPENDENT_AMBULATORY_CARE_PROVIDER_SITE_OTHER): Payer: Federal, State, Local not specified - PPO | Admitting: Internal Medicine

## 2015-03-05 ENCOUNTER — Encounter: Payer: Self-pay | Admitting: Internal Medicine

## 2015-03-05 VITALS — BP 137/91 | HR 80 | Temp 98.1°F | Resp 16 | Ht 65.5 in | Wt 187.0 lb

## 2015-03-05 DIAGNOSIS — Z9884 Bariatric surgery status: Secondary | ICD-10-CM | POA: Diagnosis not present

## 2015-03-05 DIAGNOSIS — M4647 Discitis, unspecified, lumbosacral region: Secondary | ICD-10-CM | POA: Diagnosis not present

## 2015-03-05 DIAGNOSIS — F419 Anxiety disorder, unspecified: Secondary | ICD-10-CM

## 2015-03-05 DIAGNOSIS — I10 Essential (primary) hypertension: Secondary | ICD-10-CM

## 2015-03-05 DIAGNOSIS — G4700 Insomnia, unspecified: Secondary | ICD-10-CM

## 2015-03-05 DIAGNOSIS — M519 Unspecified thoracic, thoracolumbar and lumbosacral intervertebral disc disorder: Secondary | ICD-10-CM

## 2015-03-05 MED ORDER — ALPRAZOLAM 0.5 MG PO TABS: ORAL_TABLET | ORAL | Status: DC

## 2015-03-05 MED ORDER — CYCLOBENZAPRINE HCL 10 MG PO TABS: ORAL_TABLET | ORAL | Status: DC

## 2015-03-05 NOTE — Progress Notes (Signed)
   Subjective:    Patient ID: Tammy [name], female    DOB: Feb 09, 1976, 39 y.o.   MRN: [id number]  HPI doing well and just needs med refills Patient Active Problem List   Diagnosis Date Noted  . HTN (hypertension)--- outside readings good  03/14/2013    Priority: Medium  . HA (headache)----none recent  08/14/2014  . Seizure disorder--- continues keppra from neurology with no problems  08/06/2013  . Anxiety needing less medication and doing well  10/25/2012  . Anemia--- probably resolved  08/31/2012  . Lumbosacral disc disease--- has greatly reduced problems by active range of motion therapy. 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 Status post gastric bypass for obesity 07/23/2012   Wt Readings from Last 3 Encounters:  03/05/15 187 lb (84.823 kg)  08/14/14 175 lb 6.4 oz (79.561 kg)  02/06/14 169 lb 6.4 oz (76.839 kg)   she is aware of this trend      Review of Systems No chest pain or palpitations No edema    Objective:   Physical Exam  Constitutional: She is oriented to person, place, and time. She appears well-developed and well-nourished. No distress.  HENT:  Head: Normocephalic and atraumatic.  Eyes: Pupils are equal, round, and reactive to light.  Neck: Normal range of motion.  Cardiovascular: Normal rate and regular rhythm.   Pulmonary/Chest: Effort normal. No respiratory distress.  Musculoskeletal: Normal range of motion.  Neurological: She is alert and oriented to person, place, and time.  Skin: Skin is warm and dry.  Psychiatric: She has a normal mood and affect. Her behavior is normal.  Nursing note and vitals reviewed. BP 137/91 mmHg  Pulse 80  Temp(Src) 98.1 F (36.7 C)  Resp 16  Ht 5' 5.5" (1.664 m)  Wt 187 lb (84.823 kg)  BMI 30.63 kg/m2  SpO2 100%         Assessment & Plan:  Anxiety -with Insomnia - Plan: ALPRAZolam (XANAX) 0.5 MG tablet when necessary  Essential hypertension--- needs outside blood  pressures  Lumbosacral disc disease----improved pain level  Status post gastric bypass for obesity----needs to refocus on weight loss   Meds ordered this encounter  Medications  . ALPRAZolam (XANAX) 0.5 MG tablet    Sig: TAKE 1-2 TABLETS BY MOUTH TWICE DAILY AS NEEDED FOR ANXIETY OR INSOMNIA    Dispense:  120 tablet    Refill:  5  . cyclobenzaprine (FLEXERIL) 10 MG tablet    Sig: TAKE 1 TABLET BY MOUTH AT BEDTIME    Dispense:  30 tablet    Refill:  5

## 2015-03-27 ENCOUNTER — Telehealth: Payer: Self-pay | Admitting: Neurology

## 2015-03-27 ENCOUNTER — Encounter: Payer: Self-pay | Admitting: Neurology

## 2015-03-27 NOTE — Telephone Encounter (Signed)
Dismissal paperwork prepped and forwarded to HIM for processing / Sherri S

## 2015-03-27 NOTE — Telephone Encounter (Signed)
-----   Message from Drema DallasAdam R Jaffe, DO sent at 03/26/2015  9:15 AM EDT ----- I just want to make sure this patient has been formally discharged from our practice.  4 no shows, haven't seen her in over 1.5 years.  Apparently non-compliant

## 2015-04-02 ENCOUNTER — Other Ambulatory Visit: Payer: Self-pay

## 2015-04-02 MED ORDER — MELOXICAM 15 MG PO TABS: 15.0000 mg | ORAL_TABLET | Freq: Every day | ORAL | Status: DC

## 2015-04-02 NOTE — Telephone Encounter (Signed)
Pharm reqs 90 day RF of meloxicam. Pended, but I wanted to check w/you Dr Merla Richesoolittle, to make sure you want pt on this. It is not on her current med list at most recent OV, but was at [facility] before that and I don't see any messages in between that instruct pt to DC it?

## 2015-04-08 ENCOUNTER — Telehealth: Payer: Self-pay | Admitting: Neurology

## 2015-04-08 NOTE — Telephone Encounter (Signed)
Patient dismissed from [facility] by Shon MilletAdam Jaffe D.O. , effective March 27, 2015. Dismissal letter sent out by certified / registered mail.  DAJ  Received signed domestic return receipt verifying delivery of certified letter on Apr 21, 2015. Article number [id number] [phone] DAJ

## 2015-08-22 ENCOUNTER — Telehealth: Payer: Self-pay

## 2015-08-22 DIAGNOSIS — F419 Anxiety disorder, unspecified: Secondary | ICD-10-CM

## 2015-08-22 DIAGNOSIS — G4700 Insomnia, unspecified: Secondary | ICD-10-CM

## 2015-08-22 NOTE — Telephone Encounter (Signed)
Refill request  ALPRAZolam (XANAX) 0.5 MG tablet   [employer]  [phone]

## 2015-08-23 MED ORDER — ALPRAZOLAM 0.5 MG PO TABS: ORAL_TABLET | ORAL | Status: DC

## 2015-08-23 NOTE — Telephone Encounter (Signed)
Ok to phone in one month then needs f/u to discuss additional options Meds ordered this encounter  Medications  . ALPRAZolam (XANAX) 0.5 MG tablet    Sig: TAKE 1-2 TABLETS BY MOUTH TWICE DAILY AS NEEDED FOR ANXIETY OR INSOMNIA    Dispense:  120 tablet    Refill:  0

## 2015-08-24 ENCOUNTER — Other Ambulatory Visit: Payer: Self-pay | Admitting: Internal Medicine

## 2015-08-25 NOTE — Telephone Encounter (Signed)
Sent in Rx, pt phone is not accepting calls at this time.

## 2015-08-28 ENCOUNTER — Other Ambulatory Visit: Payer: Self-pay | Admitting: Internal Medicine

## 2015-09-03 ENCOUNTER — Ambulatory Visit: Payer: Federal, State, Local not specified - PPO | Admitting: Internal Medicine

## 2015-09-24 ENCOUNTER — Encounter: Payer: Self-pay | Admitting: Internal Medicine

## 2015-09-24 ENCOUNTER — Ambulatory Visit (INDEPENDENT_AMBULATORY_CARE_PROVIDER_SITE_OTHER): Payer: Federal, State, Local not specified - PPO | Admitting: Internal Medicine

## 2015-09-24 VITALS — BP 154/111 | HR 96 | Temp 98.6°F | Resp 16 | Ht 66.0 in | Wt 168.0 lb

## 2015-09-24 DIAGNOSIS — F6389 Other impulse disorders: Secondary | ICD-10-CM | POA: Diagnosis not present

## 2015-09-24 DIAGNOSIS — G4700 Insomnia, unspecified: Secondary | ICD-10-CM

## 2015-09-24 DIAGNOSIS — F419 Anxiety disorder, unspecified: Secondary | ICD-10-CM | POA: Diagnosis not present

## 2015-09-24 DIAGNOSIS — M5416 Radiculopathy, lumbar region: Secondary | ICD-10-CM

## 2015-09-24 DIAGNOSIS — I10 Essential (primary) hypertension: Secondary | ICD-10-CM | POA: Diagnosis not present

## 2015-09-24 DIAGNOSIS — Z23 Encounter for immunization: Secondary | ICD-10-CM

## 2015-09-24 DIAGNOSIS — F424 Excoriation (skin-picking) disorder: Secondary | ICD-10-CM

## 2015-09-24 DIAGNOSIS — L981 Factitial dermatitis: Secondary | ICD-10-CM

## 2015-09-24 MED ORDER — ALPRAZOLAM 0.5 MG PO TABS: ORAL_TABLET | ORAL | Status: DC

## 2015-09-24 MED ORDER — MELOXICAM 15 MG PO TABS: 15.0000 mg | ORAL_TABLET | Freq: Every day | ORAL | Status: DC

## 2015-09-24 MED ORDER — AMLODIPINE BESYLATE 5 MG PO TABS: 5.0000 mg | ORAL_TABLET | Freq: Every day | ORAL | Status: DC

## 2015-09-24 MED ORDER — CYCLOBENZAPRINE HCL 10 MG PO TABS: 10.0000 mg | ORAL_TABLET | Freq: Every day | ORAL | Status: DC

## 2015-09-24 MED ORDER — FLUOXETINE HCL 20 MG PO TABS: 20.0000 mg | ORAL_TABLET | Freq: Every day | ORAL | Status: DC
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
## 2015-09-25 LAB — CBC WITH DIFFERENTIAL/PLATELET
Basophils Absolute: 0 10*3/uL (ref 0.0–0.1)
Basophils Relative: 0 % (ref 0–1)
Eosinophils Absolute: 0.1 10*3/uL (ref 0.0–0.7)
Eosinophils Relative: 1 % (ref 0–5)
HEMATOCRIT: 28.9 % — AB (ref 36.0–46.0)
HEMOGLOBIN: 8.3 g/dL — AB (ref 12.0–15.0)
LYMPHS ABS: 1.9 10*3/uL (ref 0.7–4.0)
LYMPHS PCT: 18 % (ref 12–46)
MCH: 22 pg — AB (ref 26.0–34.0)
MCHC: 28.7 g/dL — AB (ref 30.0–36.0)
MCV: 76.7 fL — ABNORMAL LOW (ref 78.0–100.0)
MONO ABS: 0.6 10*3/uL (ref 0.1–1.0)
MONOS PCT: 6 % (ref 3–12)
MPV: 9.3 fL (ref 8.6–12.4)
NEUTROS ABS: 8.1 10*3/uL — AB (ref 1.7–7.7)
Neutrophils Relative %: 75 % (ref 43–77)
Platelets: 485 10*3/uL — ABNORMAL HIGH (ref 150–400)
RBC: 3.77 MIL/uL — ABNORMAL LOW (ref 3.87–5.11)
RDW: 25.5 % — ABNORMAL HIGH (ref 11.5–15.5)
WBC: 10.8 10*3/uL — ABNORMAL HIGH (ref 4.0–10.5)

## 2015-09-25 LAB — COMPREHENSIVE METABOLIC PANEL
ALBUMIN: 4 g/dL (ref 3.6–5.1)
ALT: 17 U/L (ref 6–29)
AST: 65 U/L — ABNORMAL HIGH (ref 10–30)
Alkaline Phosphatase: 61 U/L (ref 33–115)
BILIRUBIN TOTAL: 0.4 mg/dL (ref 0.2–1.2)
BUN: 12 mg/dL (ref 7–25)
CALCIUM: 8.8 mg/dL (ref 8.6–10.2)
CHLORIDE: 108 mmol/L (ref 98–110)
CO2: 18 mmol/L — AB (ref 20–31)
Creat: 0.62 mg/dL (ref 0.50–1.10)
Glucose, Bld: 86 mg/dL (ref 65–99)
Potassium: 3.6 mmol/L (ref 3.5–5.3)
Sodium: 138 mmol/L (ref 135–146)
Total Protein: 7.1 g/dL (ref 6.1–8.1)

## 2015-09-25 LAB — LIPID PANEL
CHOL/HDL RATIO: 1.6 ratio (ref <=5.0)
CHOLESTEROL: 120 mg/dL — AB (ref 125–200)
HDL: 76 mg/dL (ref >=46)
LDL Cholesterol: 28 mg/dL (ref <130)
TRIGLYCERIDES: 78 mg/dL (ref <150)
VLDL: 16 mg/dL (ref <30)

## 2015-09-25 LAB — TSH: TSH: 0.476 u[IU]/mL (ref 0.350–4.500)

## 2015-11-03 ENCOUNTER — Encounter: Payer: Self-pay | Admitting: Internal Medicine

## 2015-11-17 ENCOUNTER — Other Ambulatory Visit: Payer: Self-pay | Admitting: Internal Medicine

## 2016-01-03 ENCOUNTER — Other Ambulatory Visit: Payer: Self-pay | Admitting: Internal Medicine

## 2016-02-09 ENCOUNTER — Other Ambulatory Visit: Payer: Self-pay | Admitting: Internal Medicine

## 2016-02-10 NOTE — Telephone Encounter (Signed)
Patient needs office visit for refills.

## 2016-03-04 ENCOUNTER — Other Ambulatory Visit: Payer: Self-pay | Admitting: Internal Medicine

## 2016-03-08 NOTE — Telephone Encounter (Signed)
Faxed

## 2016-03-17 ENCOUNTER — Other Ambulatory Visit: Payer: Self-pay | Admitting: Internal Medicine

## 2016-04-07 ENCOUNTER — Other Ambulatory Visit: Payer: Self-pay | Admitting: Internal Medicine

## 2016-06-16 ENCOUNTER — Ambulatory Visit (INDEPENDENT_AMBULATORY_CARE_PROVIDER_SITE_OTHER): Payer: Federal, State, Local not specified - PPO | Admitting: Family Medicine

## 2016-06-16 VITALS — BP 140/80 | HR 97 | Temp 98.9°F | Resp 18 | Ht 66.0 in | Wt 155.0 lb

## 2016-06-16 DIAGNOSIS — M5431 Sciatica, right side: Secondary | ICD-10-CM

## 2016-06-16 DIAGNOSIS — I10 Essential (primary) hypertension: Secondary | ICD-10-CM | POA: Diagnosis not present

## 2016-06-16 DIAGNOSIS — F419 Anxiety disorder, unspecified: Secondary | ICD-10-CM | POA: Diagnosis not present

## 2016-06-16 MED ORDER — ALPRAZOLAM 0.5 MG PO TABS: ORAL_TABLET | ORAL | Status: DC

## 2016-06-16 MED ORDER — FLUOXETINE HCL 20 MG PO TABS: 20.0000 mg | ORAL_TABLET | Freq: Every day | ORAL | Status: DC

## 2016-06-16 MED ORDER — LISINOPRIL-HYDROCHLOROTHIAZIDE 10-12.5 MG PO TABS: ORAL_TABLET | ORAL | Status: DC

## 2016-06-16 MED ORDER — PREDNISONE 20 MG PO TABS: 40.0000 mg | ORAL_TABLET | Freq: Every day | ORAL | Status: DC

## 2016-06-16 MED ORDER — CYCLOBENZAPRINE HCL 10 MG PO TABS: ORAL_TABLET | ORAL | Status: DC

## 2016-06-16 NOTE — Progress Notes (Signed)
   HPI  Patient presents today here for back pain, anxiety, and HTN  Back pain X 1 days. Started after staying in a hotel last night b/c her AC is broken at home.  R sided with R foot pain.  Has had long Hx of intermittent exacerbations of Lumbar disk disease.  No leg weakness, bowel or bladder dysfunction, or saddle anesthesisa.  She works as a Teacher, adult educationmassage therapist  No fevers, chiolls, sweats.   Anxiety Increased recently, her mother has a stroke last Sunday and [place] going to [place] to [place] with her Needs refil of paxil and xanax Denies SI States they both work well for her  HTN Doing well with lisonopril, requesting refill   PMH: Smoking status noted ROS: Per HPI  Objective: BP 140/80 mmHg  Pulse 97  Temp(Src) 98.9 F (37.2 C) (Oral)  Resp 18  Ht 5\' 6"  (1.676 m)  Wt 155 lb (70.308 kg)  BMI 25.03 kg/m2  SpO2 100% Gen: NAD, alert, cooperative with exam HEENT: NCAT CV: RRR, good S1/S2, no murmur Resp: CTABL, no wheezes, non-labored Ext: No edema, warm Neuro: Alert and oriented, strength 5/5 and sensation intact IN bL LE, 2+ patellar tendon reflexes, Sensation intact BL LE  MSK- R sided paraspinal muscle tnederness to palp  Assessment and plan:  # Sciatica Treat with prednisone burst and flexeril No red flags, RTC as needed  # HTN Controlled, Labs UTD Refilled  # anxiety Doing well but increased letely with family stress Refilled meds Discussed no refill of xanax without OV     Meds ordered this encounter  Medications  . DISCONTD: ALPRAZolam (XANAX) 0.5 MG tablet    Sig: TAKE 1 TABLET BY MOUTH EVERY MORNING, 1 TABLET BY MOUTH AT MIDDAY AND 2 TABLETS BY MOUTH AT BEDTIME    Dispense:  120 tablet    Refill:  0    RETURN FOR OV FOR FURTHER REFILLS  . FLUoxetine (PROZAC) 20 MG tablet    Sig: Take 1 tablet (20 mg total) by mouth daily.    Dispense:  30 tablet    Refill:  5    Office visit needed for refills  . cyclobenzaprine (FLEXERIL) 10 MG  tablet    Sig: TAKE 1 TABLET(10 MG) BY MOUTH AT BEDTIME    Dispense:  30 tablet    Refill:  0    Office visit needed  . predniSONE (DELTASONE) 20 MG tablet    Sig: Take 2 tablets (40 mg total) by mouth daily with breakfast. 2 po at sametime daily for 5 days- start tomorrow    Dispense:  10 tablet    Refill:  0  . ALPRAZolam (XANAX) 0.5 MG tablet    Sig: TAKE 1 TABLET BY MOUTH EVERY MORNING, 1 TABLET BY MOUTH AT MIDDAY AND 2 TABLETS BY MOUTH AT BEDTIME    Dispense:  120 tablet    Refill:  0    RETURN FOR OV FOR FURTHER REFILLS    Kevin FentonSamuel Bradshaw, MD 8:50 AM

## 2016-06-16 NOTE — Patient Instructions (Addendum)
Great to meet you!  I am sending prednisone and flexeril for your pain  I have refilled your xanax and paxil for your anxiety  Back Pain, Adult Back pain is very common in adults.The cause of back pain is rarely dangerous and the pain often gets better over time.The cause of your back pain may not be known. Some common causes of back pain include:  Strain of the muscles or ligaments supporting the spine.  Wear and tear (degeneration) of the spinal disks.  Arthritis.  Direct injury to the back. For many people, back pain may return. Since back pain is rarely dangerous, most people can learn to manage this condition on their own. HOME CARE INSTRUCTIONS Watch your back pain for any changes. The following actions may help to lessen any discomfort you are feeling:  Remain active. It is stressful on your back to sit or stand in one place for long periods of time. Do not sit, drive, or stand in one place for more than 30 minutes at a time. Take short walks on even surfaces as soon as you are able.Try to increase the length of time you walk each day.  Exercise regularly as directed by your health care provider. Exercise helps your back heal faster. It also helps avoid future injury by keeping your muscles strong and flexible.  Do not stay in bed.Resting more than 1-2 days can delay your recovery.  Pay attention to your body when you bend and lift. The most comfortable positions are those that put less stress on your recovering back. Always use proper lifting techniques, including:  Bending your knees.  Keeping the load close to your body.  Avoiding twisting.  Find a comfortable position to sleep. Use a firm mattress and lie on your side with your knees slightly bent. If you lie on your back, put a pillow under your knees.  Avoid feeling anxious or stressed.Stress increases muscle tension and can worsen back pain.It is important to recognize when you are anxious or stressed and  learn ways to manage it, such as with exercise.  Take medicines only as directed by your health care provider. Over-the-counter medicines to reduce pain and inflammation are often the most helpful.Your health care provider may prescribe muscle relaxant drugs.These medicines help dull your pain so you can more quickly return to your normal activities and healthy exercise.  Apply ice to the injured area:  Put ice in a plastic bag.  Place a towel between your skin and the bag.  Leave the ice on for 20 minutes, 2-3 times a day for the first 2-3 days. After that, ice and heat may be alternated to reduce pain and spasms.  Maintain a healthy weight. Excess weight puts extra stress on your back and makes it difficult to maintain good posture. SEEK MEDICAL CARE IF:  You have pain that is not relieved with rest or medicine.  You have increasing pain going down into the legs or buttocks.  You have pain that does not improve in one week.  You have night pain.  You lose weight.  You have a fever or chills. SEEK IMMEDIATE MEDICAL CARE IF:   You develop new bowel or bladder control problems.  You have unusual weakness or numbness in your arms or legs.  You develop nausea or vomiting.  You develop abdominal pain.  You feel faint.   This information is not intended to replace advice given to you by your health care provider. Make sure you discuss  any questions you have with your health care provider.   Document Released: 11/22/2005 Document Revised: 12/13/2014 Document Reviewed: 03/26/2014 Elsevier Interactive Patient Education 2016 [employer].    IF you received an x-ray today, you will receive an invoice from [facility]. Please contact [facility] at [phone] with questions or concerns regarding your invoice.   IF you received labwork today, you will receive an invoice from [facility]. Please contact [facility] at [phone] with  questions or concerns regarding your invoice.   Our billing staff will not be able to assist you with questions regarding bills from these companies.  You will be contacted with the lab results as soon as they are available. The fastest way to get your results is to activate your My Chart account. Instructions are located on the last page of this paperwork. If you have not heard from [place] regarding the results in 2 weeks, please contact this office.

## 2016-07-29 ENCOUNTER — Ambulatory Visit (INDEPENDENT_AMBULATORY_CARE_PROVIDER_SITE_OTHER): Payer: Federal, State, Local not specified - PPO | Admitting: Family Medicine

## 2016-07-29 VITALS — BP 140/80 | HR 89 | Temp 98.4°F | Resp 16 | Ht 66.0 in | Wt 159.4 lb

## 2016-07-29 DIAGNOSIS — I675 Moyamoya disease: Secondary | ICD-10-CM

## 2016-07-29 DIAGNOSIS — F411 Generalized anxiety disorder: Secondary | ICD-10-CM

## 2016-07-29 MED ORDER — ALPRAZOLAM 0.5 MG PO TABS: ORAL_TABLET | ORAL | 5 refills | Status: DC

## 2016-07-29 NOTE — Progress Notes (Signed)
Tammy [name] is a 40 y.o. female who presents to Urgent Care today for anxiety and congential family history of moya moya  1.  Anxiety:  Longstanding history of the same.  Treated with Prozac and anxiety.  She has been out of her xanax for a couple of days and didn't notice much of a difference.  However her husband states that her OCD and "picking" at her skin greatly increased.  Sleep has diminished, having trouble falling and staying asleep.  No depressive symptoms.  No SI/HI  2.  Moya moya:  Mother in her 5960s, recently had a hemorrhagic stroke.  [place]  Because of her relatively young age, she underwent series of tests (unknown exactly what) and diagnosed with moya moya.  Patient is concerned about this and wants to know if there are any diagnostic tests for her or her family.  She had a seizure in 2014.  No other neurological issues.  No history of stroke.  No balance issues.  No weakness.  ROS as above.    PMH reviewed. Patient is a nonsmoker.   Past Medical History:  Diagnosis Date  . Allergy   . Anemia   . Anxiety   . Blood transfusion without reported diagnosis   . Hypertension   . SVD (spontaneous vaginal delivery)    x 5   Past Surgical History:  Procedure Laterality Date  . DILITATION & CURRETTAGE/HYSTROSCOPY WITH HYDROTHERMAL ABLATION  10/11/2012   Procedure: DILATATION & CURETTAGE/HYSTEROSCOPY WITH HYDROTHERMAL ABLATION;  Surgeon: Kathreen CosierBernard A Marshall, MD;  Location: [facility];  Service: Gynecology;  Laterality: N/A;  . 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 GASTRIC OUTLET OBSTRUCTION RELEASE  2005   post gastric bypass  . TUBAL LIGATION  2002    Medications reviewed. Current Outpatient Prescriptions  Medication Sig Dispense Refill  . ALPRAZolam (XANAX) 0.5 MG tablet TAKE 1 TABLET BY MOUTH EVERY MORNING, 1 TABLET BY MOUTH AT MIDDAY AND 2 TABLETS BY MOUTH AT BEDTIME 120 tablet 0  . amLODipine (NORVASC) 5 MG tablet Take 1 tablet (5 mg  total) by mouth daily. 90 tablet 3  . Calcium Carbonate-Vit D-Min (CALCIUM 1200 PO) Take by mouth.    . Cyanocobalamin (VITAMIN B 12 PO) Take by mouth.    . ferrous sulfate 325 (65 FE) MG tablet Take 325 mg by mouth daily with breakfast.    . FLUoxetine (PROZAC) 20 MG tablet Take 1 tablet (20 mg total) by mouth daily. 30 tablet 5  . lisinopril-hydrochlorothiazide (PRINZIDE,ZESTORETIC) 10-12.5 MG tablet TAKE 1 TABLET BY MOUTH DAILY 90 tablet 1  . meloxicam (MOBIC) 15 MG tablet Take 1 tablet (15 mg total) by mouth daily. 90 tablet 3  . Multiple Vitamin (MULTIVITAMIN) tablet Take 1 tablet by mouth daily.    . cyclobenzaprine (FLEXERIL) 10 MG tablet TAKE 1 TABLET(10 MG) BY MOUTH AT BEDTIME (Patient not taking: Reported on 07/29/2016) 30 tablet 0   No current facility-administered medications for this visit.      Physical Exam:  BP 140/80 (BP Location: Left Arm, Patient Position: Sitting, Cuff Size: Normal)   Pulse 89   Temp 98.4 F (36.9 C) (Oral)   Resp 16   Ht 5\' 6"  (1.676 m)   Wt 159 lb 6.4 oz (72.3 kg)   SpO2 99%   BMI 25.73 kg/m  Gen:  Alert, cooperative patient who appears stated age in no acute distress.  Vital signs reviewed. HEENT: EOMI,  MMM Pulm:  Clear to auscultation bilaterally with good air movement.  No wheezes or rales noted.   Cardiac:  Regular rate and rhythm without murmur auscultated.  Good S1/S2. Abd:  Soft/nondistended/nontender.  Good bowel sounds throughout all four quadrants.  No masses noted.  Exts: Non edematous BL  LE, warm and well perfused.  Psych:  Pleasant, not depressed or currently anxious appearing.  Neuro:  No focal deficits.  Assessment and Plan:  1.  Anxiety: - refill for xanax today.  - continue prozac - well controlled currently.   2.  Family history of moya moya: - will add to family history here in epic  - discussed no good screening tests for this.  She can discuss with her neurologist if she would like to have screening arteriography.    - will FU with neurology.

## 2016-07-29 NOTE — Patient Instructions (Addendum)
I have refilled your Xanax.  It sounds like things are going well from an anxiety standpoint.  There aren't good screening tests for moya moya.  A neurologist would likely have better information for you.  It was good to see you today!    IF you received an x-ray today, you will receive an invoice from [facility]. Please contact [facility] at [phone] with questions or concerns regarding your invoice.   IF you received labwork today, you will receive an invoice from [facility]. Please contact [facility] at [phone] with questions or concerns regarding your invoice.   Our billing staff will not be able to assist you with questions regarding bills from these companies.  You will be contacted with the lab results as soon as they are available. The fastest way to get your results is to activate your My Chart account. Instructions are located on the last page of this paperwork. If you have not heard from us regarding the results in 2 weeks, please contact this office.

## 2016-10-31 ENCOUNTER — Other Ambulatory Visit: Payer: Self-pay | Admitting: Family Medicine

## 2016-11-01 NOTE — Telephone Encounter (Signed)
If denied, route to pool

## 2016-11-08 ENCOUNTER — Other Ambulatory Visit: Payer: Self-pay

## 2016-11-08 MED ORDER — MELOXICAM 15 MG PO TABS: 15.0000 mg | ORAL_TABLET | Freq: Every day | ORAL | 0 refills | Status: DC

## 2016-12-07 ENCOUNTER — Other Ambulatory Visit: Payer: Self-pay | Admitting: Family Medicine

## 2016-12-11 ENCOUNTER — Other Ambulatory Visit: Payer: Self-pay | Admitting: Family Medicine

## 2017-01-10 ENCOUNTER — Other Ambulatory Visit: Payer: Self-pay | Admitting: Family Medicine

## 2017-02-05 ENCOUNTER — Other Ambulatory Visit: Payer: Self-pay | Admitting: Physician Assistant

## 2017-02-05 NOTE — Telephone Encounter (Signed)
12/4/17last refill 07/2016 last ov

## 2017-02-17 ENCOUNTER — Ambulatory Visit: Payer: Federal, State, Local not specified - PPO

## 2017-03-11 ENCOUNTER — Other Ambulatory Visit: Payer: Self-pay | Admitting: Physician Assistant

## 2017-03-11 NOTE — Telephone Encounter (Signed)
02/05/17 last refill 

## 2017-03-13 NOTE — Telephone Encounter (Signed)
Refill of Meloxicam denied.  Patient overdue for six month follow-up and to establish with new provider. Please schedule OV with me or whoever she is choosing as PCP.

## 2017-06-30 ENCOUNTER — Encounter: Payer: Self-pay | Admitting: Physician Assistant

## 2017-06-30 ENCOUNTER — Ambulatory Visit (INDEPENDENT_AMBULATORY_CARE_PROVIDER_SITE_OTHER): Payer: Federal, State, Local not specified - PPO | Admitting: Physician Assistant

## 2017-06-30 VITALS — BP 130/82 | HR 74 | Temp 99.3°F | Resp 16 | Ht 66.0 in | Wt 163.6 lb

## 2017-06-30 DIAGNOSIS — M545 Low back pain: Secondary | ICD-10-CM | POA: Diagnosis not present

## 2017-06-30 DIAGNOSIS — F419 Anxiety disorder, unspecified: Secondary | ICD-10-CM

## 2017-06-30 DIAGNOSIS — M519 Unspecified thoracic, thoracolumbar and lumbosacral intervertebral disc disorder: Secondary | ICD-10-CM

## 2017-06-30 DIAGNOSIS — G8929 Other chronic pain: Secondary | ICD-10-CM | POA: Diagnosis not present

## 2017-06-30 DIAGNOSIS — Z760 Encounter for issue of repeat prescription: Secondary | ICD-10-CM | POA: Diagnosis not present

## 2017-06-30 DIAGNOSIS — I10 Essential (primary) hypertension: Secondary | ICD-10-CM

## 2017-06-30 MED ORDER — LISINOPRIL-HYDROCHLOROTHIAZIDE 10-12.5 MG PO TABS: ORAL_TABLET | ORAL | 4 refills | Status: DC

## 2017-06-30 MED ORDER — AMLODIPINE BESYLATE 5 MG PO TABS: 5.0000 mg | ORAL_TABLET | Freq: Every day | ORAL | 4 refills | Status: DC

## 2017-06-30 MED ORDER — FLUOXETINE HCL 20 MG PO CAPS: 20.0000 mg | ORAL_CAPSULE | Freq: Every day | ORAL | 6 refills | Status: DC

## 2017-06-30 MED ORDER — ALPRAZOLAM 0.5 MG PO TABS: ORAL_TABLET | ORAL | 5 refills | Status: DC

## 2017-06-30 MED ORDER — MELOXICAM 15 MG PO TABS: ORAL_TABLET | ORAL | 4 refills | Status: DC

## 2017-06-30 MED ORDER — CYCLOBENZAPRINE HCL 10 MG PO TABS
ORAL_TABLET | ORAL | 1 refills | Status: DC
Start: 2017-06-30 — End: 2017-12-09

## 2017-06-30 NOTE — Patient Instructions (Addendum)
Enjoy your empty nest!  Thank you for coming in today. I hope you feel we met your needs.  Feel free to call PCP if you have any questions or further requests.  Please consider signing up for MyChart if you do not already have it, as this is a great way to communicate with me.  Best,  Whitney McVey, PA-C   DASH Eating Plan DASH stands for "Dietary Approaches to Stop Hypertension." The DASH eating plan is a healthy eating plan that has been shown to reduce high blood pressure (hypertension). It may also reduce your risk for type 2 diabetes, heart disease, and stroke. The DASH eating plan may also help with weight loss. What are tips for following this plan? General guidelines  Avoid eating more than 2,300 mg (milligrams) of salt (sodium) a day. If you have hypertension, you may need to reduce your sodium intake to 1,500 mg a day.  Limit alcohol intake to no more than 1 drink a day for nonpregnant women and 2 drinks a day for men. One drink equals 12 oz of beer, 5 oz of wine, or 1 oz of hard liquor.  Work with your health care provider to maintain a healthy body weight or to lose weight. Ask what an ideal weight is for you.  Get at least 30 minutes of exercise that causes your heart to beat faster (aerobic exercise) most days of the week. Activities may include walking, swimming, or biking.  Work with your health care provider or diet and nutrition specialist (dietitian) to adjust your eating plan to your individual calorie needs. Reading food labels  Check food labels for the amount of sodium per serving. Choose foods with less than 5 percent of the Daily Value of sodium. Generally, foods with less than 300 mg of sodium per serving fit into this eating plan.  To find whole grains, look for the word "whole" as the first word in the ingredient list. Shopping  Buy products labeled as "low-sodium" or "no salt added."  Buy fresh foods. Avoid canned foods and premade or frozen  meals. Cooking  Avoid adding salt when cooking. Use salt-free seasonings or herbs instead of table salt or sea salt. Check with your health care provider or pharmacist before using salt substitutes.  Do not fry foods. Cook foods using healthy methods such as baking, boiling, grilling, and broiling instead.  Cook with heart-healthy oils, such as olive, canola, soybean, or sunflower oil. Meal planning   Eat a balanced diet that includes: ? 5 or more servings of fruits and vegetables each day. At each meal, try to fill half of your plate with fruits and vegetables. ? Up to 6-8 servings of whole grains each day. ? Less than 6 oz of lean meat, poultry, or fish each day. A 3-oz serving of meat is about the same size as a deck of cards. One egg equals 1 oz. ? 2 servings of low-fat dairy each day. ? A serving of nuts, seeds, or beans 5 times each week. ? Heart-healthy fats. Healthy fats called Omega-3 fatty acids are found in foods such as flaxseeds and coldwater fish, like sardines, salmon, and mackerel.  Limit how much you eat of the following: ? Canned or prepackaged foods. ? Food that is high in trans fat, such as fried foods. ? Food that is high in saturated fat, such as fatty meat. ? Sweets, desserts, sugary drinks, and other foods with added sugar. ? Full-fat dairy products.  Do not salt  foods before eating.  Try to eat at least 2 vegetarian meals each week.  Eat more home-cooked food and less restaurant, buffet, and fast food.  When eating at a restaurant, ask that your food be prepared with less salt or no salt, if possible. What foods are recommended? The items listed may not be a complete list. Talk with your dietitian about what dietary choices are best for you. Grains Whole-grain or whole-wheat bread. Whole-grain or whole-wheat pasta. Brown rice. [place]. Bulgur. Whole-grain and low-sodium cereals. Pita bread. Low-fat, low-sodium crackers. Whole-wheat flour  tortillas. Vegetables Fresh or frozen vegetables (raw, steamed, roasted, or grilled). Low-sodium or reduced-sodium tomato and vegetable juice. Low-sodium or reduced-sodium tomato sauce and tomato paste. Low-sodium or reduced-sodium canned vegetables. Fruits All fresh, dried, or frozen fruit. Canned fruit in natural juice (without added sugar). Meat and other protein foods Skinless chicken or [place]. Ground chicken or [place]. Pork with fat trimmed off. Fish and seafood. Egg whites. Dried beans, peas, or lentils. Unsalted nuts, nut butters, and seeds. Unsalted canned beans. Lean cuts of beef with fat trimmed off. Low-sodium, lean deli meat. Dairy Low-fat (1%) or fat-free (skim) milk. Fat-free, low-fat, or reduced-fat cheeses. Nonfat, low-sodium ricotta or cottage cheese. Low-fat or nonfat yogurt. Low-fat, low-sodium cheese. Fats and oils Soft margarine without trans fats. Vegetable oil. Low-fat, reduced-fat, or light mayonnaise and salad dressings (reduced-sodium). Canola, safflower, olive, soybean, and sunflower oils. Avocado. Seasoning and other foods Herbs. Spices. Seasoning mixes without salt. Unsalted popcorn and pretzels. Fat-free sweets. What foods are not recommended? The items listed may not be a complete list. Talk with your dietitian about what dietary choices are best for you. Grains Baked goods made with fat, such as croissants, muffins, or some breads. Dry pasta or rice meal packs. Vegetables Creamed or fried vegetables. Vegetables in a cheese sauce. Regular canned vegetables (not low-sodium or reduced-sodium). Regular canned tomato sauce and paste (not low-sodium or reduced-sodium). Regular tomato and vegetable juice (not low-sodium or reduced-sodium). Angie Fava. Olives. Fruits Canned fruit in a light or heavy syrup. Fried fruit. Fruit in cream or butter sauce. Meat and other protein foods Fatty cuts of meat. Ribs. Fried meat. Berniece Salines. Sausage. Bologna and other processed lunch meats.  Salami. Fatback. Hotdogs. Bratwurst. Salted nuts and seeds. Canned beans with added salt. Canned or smoked fish. Whole eggs or egg yolks. Chicken or [place] with skin. Dairy Whole or 2% milk, cream, and half-and-half. Whole or full-fat cream cheese. Whole-fat or sweetened yogurt. Full-fat cheese. Nondairy creamers. Whipped toppings. Processed cheese and cheese spreads. Fats and oils Butter. Stick margarine. Lard. Shortening. Ghee. Bacon fat. Tropical oils, such as coconut, palm kernel, or palm oil. Seasoning and other foods Salted popcorn and pretzels. Onion salt, garlic salt, seasoned salt, table salt, and sea salt. Worcestershire sauce. Tartar sauce. Barbecue sauce. Teriyaki sauce. Soy sauce, including reduced-sodium. Steak sauce. Canned and packaged gravies. Fish sauce. Oyster sauce. Cocktail sauce. Horseradish that you find on the shelf. Ketchup. Mustard. Meat flavorings and tenderizers. Bouillon cubes. Hot sauce and Tabasco sauce. Premade or packaged marinades. Premade or packaged taco seasonings. Relishes. Regular salad dressings. Where to find more information:  National Heart, Lung, and [place]: https://wilson-eaton.com/  American Heart Association: www.heart.org Summary  The DASH eating plan is a healthy eating plan that has been shown to reduce high blood pressure (hypertension). It may also reduce your risk for type 2 diabetes, heart disease, and stroke.  With the DASH eating plan, you should limit salt (sodium) intake to 2,300  mg a day. If you have hypertension, you may need to reduce your sodium intake to 1,500 mg a day.  When on the DASH eating plan, aim to eat more fresh fruits and vegetables, whole grains, lean proteins, low-fat dairy, and heart-healthy fats.  Work with your health care provider or diet and nutrition specialist (dietitian) to adjust your eating plan to your individual calorie needs. This information is not intended to replace advice given to you by your health  care provider. Make sure you discuss any questions you have with your health care provider. Document Released: 11/11/2011 Document Revised: 11/15/2016 Document Reviewed: 11/15/2016 Elsevier Interactive Patient Education  2017 [employer].    IF you received an x-ray today, you will receive an invoice from [facility]. Please contact [facility] at [phone] with questions or concerns regarding your invoice.   IF you received labwork today, you will receive an invoice from [facility]. Please contact [facility] at [phone] with questions or concerns regarding your invoice.   Our billing staff will not be able to assist you with questions regarding bills from these companies.  You will be contacted with the lab results as soon as they are available. The fastest way to get your results is to activate your My Chart account. Instructions are located on the last page of this paperwork. If you have not heard from [place] regarding the results in 2 weeks, please contact this office.

## 2017-06-30 NOTE — Progress Notes (Signed)
Tammy [name]  MRN: [id number] DOB: 01/28/1976  PCP: Wardell Honour, MD  Subjective:  Pt is a 41 year old female PMH HTN, seizure disorder, anemia and anxiety who presents to clinic for medication refill. She has been out of all medications x 3 months. She has a history of moya moya disease.   Anxiety - Controlled with Alprazolam 0.'5mg'$ , Fluoxetine '20mg'$ . She takes this daily. Sometimes skips Xanax when she is having a good day. Takes 2 xanax at bedtime as she has difficulty sleeping. Denies SI and HI. She is "a Optician, dispensing" this has gotten worse x 3 months since she has been out of her medications.   HTN - Blood pressure today is 130/82 - Controlled with Amlodipine '5mg'$ , Lisinopril-HCTZ 10-12.'5mg'$ . Denies side effects. Feeling well. She endorses eating healthy diet, does not eat a lot of processed food.   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 H/o back pain - L5-S1 "disc issues" - Controlled with Cyclobenzaprine 10 mg. Used to go to pain management with Dr. Orpah Melter. She no longer has to go here as her back pain is more well controlled. She is a massage therapist and gets massages regularly. Last Flexeril Rx 7  Months ago.   HPI OV 07/29/2016: "Moya moya:  Mother in her 62s, recently had a hemorrhagic stroke.  Lives in [place].  Because of her relatively young age, she underwent series of tests (unknown exactly what) and diagnosed with moya moya.  Patient is concerned about this and wants to know if there are any diagnostic tests for her or her family.  She had a seizure in 2014.  No other neurological issues.  No history of stroke.  No balance issues.  No weakness."  Review of Systems  Constitutional: Negative for chills, fatigue and fever.  Respiratory: Negative for cough and shortness of breath.   Cardiovascular: Negative for chest pain, palpitations and leg swelling.  Gastrointestinal: Negative for nausea and vomiting.  Musculoskeletal: Positive for arthralgias  and back pain.  Neurological: Negative for dizziness, weakness, light-headedness and headaches.  Psychiatric/Behavioral: Negative for suicidal ideas. The patient is nervous/anxious.     Patient Active Problem List   Diagnosis Date Noted  . HA (headache) 08/14/2014  . Seizure disorder ([place]) 08/06/2013  . HTN (hypertension) 03/14/2013  . Anxiety 10/25/2012  . Anemia 08/31/2012  . Lumbosacral disc disease 07/23/2012  . Status post gastric bypass for obesity 07/23/2012    Current Outpatient Prescriptions on File Prior to Visit  Medication Sig Dispense Refill  . ALPRAZolam (XANAX) 0.5 MG tablet TAKE 1 TABLET BY MOUTH EVERY MORNING, 1 TABLET BY MOUTH AT MIDDAY AND 2 TABLETS BY MOUTH AT BEDTIME 120 tablet 5  . amLODipine (NORVASC) 5 MG tablet Take 1 tablet (5 mg total) by mouth daily. 90 tablet 3  . Calcium Carbonate-Vit D-Min (CALCIUM 1200 PO) Take by mouth.    . Cyanocobalamin (VITAMIN B 12 PO) Take by mouth.    . cyclobenzaprine (FLEXERIL) 10 MG tablet TAKE 1 TABLET BY MOUTH AT BEDTIME. PATIENT NEEDS AN OFFICE VISIT. 30 tablet 0  . ferrous sulfate 325 (65 FE) MG tablet Take 325 mg by mouth daily with breakfast.    . FLUoxetine (PROZAC) 20 MG capsule TAKE ONE CAPSULE BY MOUTH DAILY 30 capsule 0  . lisinopril-hydrochlorothiazide (PRINZIDE,ZESTORETIC) 10-12.5 MG tablet TAKE 1 TABLET BY MOUTH DAILY 90 tablet 1  . meloxicam (MOBIC) 15 MG tablet TAKE 1 TABLET(15 MG) BY MOUTH DAILY 30 tablet 0  .  Multiple Vitamin (MULTIVITAMIN) tablet Take 1 tablet by mouth daily.     No current facility-administered medications on file prior to visit.     Allergies  Allergen Reactions  . Nsaids Other (See Comments)    Had gastric bypass sx- Pt has a sensitivity to drug Pt states that she can take NSAIDS but should use them in moderation     Objective:  BP (!) 148/92   Pulse (!) 113   Temp 99.3 F (37.4 C) (Oral)   Resp 16   Ht _0  (1.676 m)   Wt 163 lb 9.6 oz (74.2 kg)   SpO2 100%   BMI 26.41  kg/m   Physical Exam  Constitutional: She is oriented to person, place, and time and well-developed, well-nourished, and in no distress. No distress.  Cardiovascular: Normal rate, regular rhythm and normal heart sounds.   Neurological: She is alert and oriented to person, place, and time. GCS score is 15.  Skin: Skin is warm and dry.  Psychiatric: Mood, memory, affect and judgment normal.  Vitals reviewed.   Assessment and Plan :  1. Encounter for medication refill 2. Essential hypertension - Recheck vitals - Lipid panel - CMP14+EGFR - amLODipine (NORVASC) 5 MG tablet; Take 1 tablet (5 mg total) by mouth daily.  Dispense: 90 tablet; Refill: 4 - lisinopril-hydrochlorothiazide (PRINZIDE,ZESTORETIC) 10-12.5 MG tablet; TAKE 1 TABLET BY MOUTH DAILY  Dispense: 90 tablet; Refill: 4 - BP is wnl. Pt has not had routine labs in several years. Will draw today. Labs are pending. Will contact with results.  3. Anxiety - ALPRAZolam (XANAX) 0.5 MG tablet; TAKE 1 TABLET BY MOUTH EVERY MORNING, 1 TABLET BY MOUTH AT MIDDAY AND 2 TABLETS BY MOUTH AT BEDTIME  Dispense: 120 tablet; Refill: 5 - FLUoxetine (PROZAC) 20 MG capsule; Take 1 capsule (20 mg total) by mouth daily.  Dispense: 30 capsule; Refill: 6 - Controlled. Pt is doing well. Denies HI or SI. OK to refill. RTC in 6 months for refill.  4. Lumbosacral disc disease 5. Chronic bilateral low back pain without sciatica - cyclobenzaprine (FLEXERIL) 10 MG tablet; TAKE 1 TABLET BY MOUTH AT BEDTIME. PATIENT NEEDS AN OFFICE VISIT.  Dispense: 30 tablet; Refill: 1 - meloxicam (MOBIC) 15 MG tablet; TAKE 1 TABLET(15 MG) BY MOUTH DAILY  Dispense: 30 tablet; Refill: 4  Whitney Lititia Sen, PA-C  [facility] 06/30/2017 10:57 AM

## 2017-07-01 LAB — CMP14+EGFR
ALT: 10 IU/L (ref 0–32)
AST: 16 IU/L (ref 0–40)
Albumin/Globulin Ratio: 1.1 — ABNORMAL LOW (ref 1.2–2.2)
Albumin: 3.9 g/dL (ref 3.5–5.5)
Alkaline Phosphatase: 74 IU/L (ref 39–117)
BUN/Creatinine Ratio: 13 (ref 9–23)
BUN: 9 mg/dL (ref 6–24)
Bilirubin Total: <0.2 mg/dL (ref 0.0–1.2)
CO2: 17 mmol/L — ABNORMAL LOW (ref 20–29)
Calcium: 9.2 mg/dL (ref 8.7–10.2)
Chloride: 110 mmol/L — ABNORMAL HIGH (ref 96–106)
Creatinine, Ser: 0.7 mg/dL (ref 0.57–1.00)
GFR calc Af Amer: 124 mL/min/{1.73_m2} (ref >59)
GFR calc non Af Amer: 108 mL/min/{1.73_m2} (ref >59)
Globulin, Total: 3.4 g/dL (ref 1.5–4.5)
Glucose: 79 mg/dL (ref 65–99)
Potassium: 4.3 mmol/L (ref 3.5–5.2)
Sodium: 141 mmol/L (ref 134–144)
Total Protein: 7.3 g/dL (ref 6.0–8.5)

## 2017-07-01 LAB — LIPID PANEL
Chol/HDL Ratio: 1.8 ratio (ref 0.0–4.4)
Cholesterol, Total: 84 mg/dL — ABNORMAL LOW (ref 100–199)
HDL: 46 mg/dL (ref >39)
LDL Calculated: 26 mg/dL (ref 0–99)
Triglycerides: 60 mg/dL (ref 0–149)
VLDL Cholesterol Cal: 12 mg/dL (ref 5–40)

## 2017-11-12 ENCOUNTER — Other Ambulatory Visit: Payer: Self-pay | Admitting: Physician Assistant

## 2017-11-12 DIAGNOSIS — M519 Unspecified thoracic, thoracolumbar and lumbosacral intervertebral disc disorder: Secondary | ICD-10-CM

## 2017-12-09 ENCOUNTER — Other Ambulatory Visit: Payer: Self-pay | Admitting: Physician Assistant

## 2017-12-09 DIAGNOSIS — M519 Unspecified thoracic, thoracolumbar and lumbosacral intervertebral disc disorder: Secondary | ICD-10-CM

## 2017-12-09 NOTE — Telephone Encounter (Signed)
Requesting refill of Flexeril  LOV 06/30/17 with W. McVey

## 2017-12-11 ENCOUNTER — Other Ambulatory Visit: Payer: Self-pay | Admitting: Physician Assistant

## 2017-12-11 DIAGNOSIS — M519 Unspecified thoracic, thoracolumbar and lumbosacral intervertebral disc disorder: Secondary | ICD-10-CM

## 2017-12-11 DIAGNOSIS — F419 Anxiety disorder, unspecified: Secondary | ICD-10-CM

## 2018-01-09 ENCOUNTER — Other Ambulatory Visit: Payer: Self-pay | Admitting: Physician Assistant

## 2018-01-09 DIAGNOSIS — F419 Anxiety disorder, unspecified: Secondary | ICD-10-CM

## 2018-01-09 DIAGNOSIS — M519 Unspecified thoracic, thoracolumbar and lumbosacral intervertebral disc disorder: Secondary | ICD-10-CM

## 2018-01-18 NOTE — Progress Notes (Signed)
Subjective:    Patient ID: Tammy [name], female    DOB: 21-May-1976, 42 y.o.   MRN: [id number]  01/23/2018  Chronic Conditions (6 month follow-up )    HPI This 42 y.o. female presents for eight month follow-up of hypertension, anxiety/depression, insomnia.  Last visit in July 2108 with [place], PA-C.    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  Three years duration. Before that worked at [employer].  Followed by Dr. Laney Pastor since age 37.  Dr. Ruthann Cancer retired two months later.    Mother died in Oct 28, 2017.  Moya Moya diagnosed; aneurysm ruptured and CVA in  July 2017 with L sided paralysis.  Then had another CVA in October 28, 2017 and died.  Maternal generic running. Migraine issues; seizure in 2014.  S/p MRI and CT then.  After CVA with mother, L5-S1 since 2006.  R sided issues.  Now L sided issues.  Saddle pain with numbness.  No rgency; when goes, really goes a lot.  No incontinence.  R sided issues can manage with issues.  Will need muscle relaxer.  Meloxicam can help; L side has been severe.  L hip region and down side.  Muscular involvement.   R shoulder issues: frozen shoulder; felt ripping; feeling better afterwards.  With excessive work, can cause worsening pain.  If sleeps on R shoulder, pain will worsen.   L nare and eye and L ear; watery sinusing; laying on RIGHT side.  No medications.  If gets headache, takes theraflu; catches cold.  No nasal saline.  Thinking about netti pot.   HTN: at home, having some lows 120s/70s.  Rarely gets dizzy if stands too long/  OBesity: s/p gastric bypass.  Has lost a lot of weight.  Surgery in October 2005.  Husband lost 80 pounds after patient's surgery.  Has lost 15 pounds in past six months.  Now at ideal weight.   Seizure: occurred while taking Tramadol for back pain while maintained on Wellbutrin; thinks that seizure threshold lowered by medications.  S/p Keppra for one year.  Weaned Keppra.  Had been on  Wellbutrin and Tramadol.  The weekend with seizure, graduation weekend during really hot food.  Missed high school graduation.  No neurology follow-up.  Increase in migraines for a while; memory issues.    Anxiety and depression: Patient reports good compliance with medication, good tolerance to medication, and good symptom control.  Has been taking Xanax four a day for years; doing well emotionally right now.  Has now made family a priority first and then work aligns around family priorities.     BP Readings from Last 3 Encounters:  01/23/18 130/70  06/30/17 130/82  07/29/16 140/80   Wt Readings from Last 3 Encounters:  01/23/18 147 lb (66.7 kg)  06/30/17 163 lb 9.6 oz (74.2 kg)  07/29/16 159 lb 6.4 oz (72.3 kg)   Immunization History  Administered Date(s) Administered  . Influenza Split 09/01/2012  . Influenza,inj,Quad PF,6+ Mos 09/05/2013, 09/24/2015  . Tdap 10/25/2012    Review of Systems  Constitutional: Negative for chills, diaphoresis, fatigue and fever.  Eyes: Negative for visual disturbance.  Respiratory: Negative for cough and shortness of breath.   Cardiovascular: Negative for chest pain, palpitations and leg swelling.  Gastrointestinal: Negative for abdominal pain, constipation, diarrhea, nausea and vomiting.  Endocrine: Negative for cold intolerance, heat intolerance, polydipsia, polyphagia and polyuria.  Musculoskeletal: Positive for arthralgias and back pain.  Neurological: Negative for dizziness, tremors, seizures, syncope, facial  asymmetry, speech difficulty, weakness, light-headedness, numbness and headaches.  Psychiatric/Behavioral: Positive for dysphoric mood. Negative for self-injury, sleep disturbance and suicidal ideas. The patient is nervous/anxious.     Past Medical History:  Diagnosis Date  . Allergy   . Anemia   . Anxiety   . Blood transfusion without reported diagnosis   . Hypertension   . SVD (spontaneous vaginal delivery)    x 5   Past  Surgical History:  Procedure Laterality Date  . DILITATION & CURRETTAGE/HYSTROSCOPY WITH HYDROTHERMAL ABLATION  10/11/2012   Procedure: DILATATION & CURETTAGE/HYSTEROSCOPY WITH HYDROTHERMAL ABLATION;  Surgeon: Frederico Hamman, MD;  Location: [facility];  Service: Gynecology;  Laterality: N/A;  . 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 GASTRIC OUTLET OBSTRUCTION RELEASE  2005   post gastric bypass  . TUBAL LIGATION  2002   Allergies  Allergen Reactions  . Nsaids Other (See Comments)    Had gastric bypass sx- Pt has a sensitivity to drug Pt states that she can take NSAIDS but should use them in moderation   Current Outpatient Medications on File Prior to Visit  Medication Sig Dispense Refill  . Calcium Carbonate-Vit D-Min (CALCIUM 1200 PO) Take by mouth.    . Cyanocobalamin (VITAMIN B 12 PO) Take by mouth.    . ferrous sulfate 325 (65 FE) MG tablet Take 325 mg by mouth daily with breakfast.    . Multiple Vitamin (MULTIVITAMIN) tablet Take 1 tablet by mouth daily.     No current facility-administered medications on file prior to visit.    Social History   Socioeconomic History  . Marital status: Married    Spouse name: Not on file  . Number of children: 5  . Years of education: some colle  . Highest education level: Not on file  Social Needs  . Financial resource strain: Not on file  . Food insecurity - worry: Not on file  . Food insecurity - inability: Not on file  . Transportation needs - medical: Not on file  . Transportation needs - non-medical: Not on file  Occupational History  . Occupation: Massage Therapist     Comment: Kneaded Injury   Tobacco Use  . Smoking status: Never Smoker  . Smokeless tobacco: Never Used  Substance and Sexual Activity  . Alcohol use: Yes    Comment: rare   . Drug use: No  . Sexual activity: Yes    Partners: Female    Birth control/protection: Surgical  Other Topics Concern  . Not on file  Social History  Narrative   Marital status: married x 18 years      Children: four children; 1 grandchildren (3)      Lives: with husband, youngest son.      Employment: massage therapist part time in 2019.      Tobacco: none      Alcohol: none      Exercise: walking 30 minutes 3 times per week.    Family History  Problem Relation Age of Onset  . Hypertension Mother   . Hepatitis Mother   . Hyperthyroidism Mother   . Heart disease Mother   . Stroke Mother   . Moyamoya disease Mother   . Depression Mother   . Obesity Sister   . Hypertension Sister   . Cancer Maternal Grandmother        uterine  . Heart disease Maternal Grandmother   . Hypertension Maternal Grandmother   . Cancer Maternal Grandfather  multiple myeloma  . Hypertension Maternal Grandfather   . Bipolar disorder Father   . Hypertension Father   . Hepatitis Father   . Migraines Daughter        Objective:    BP 130/70   Pulse (!) 130   Temp 98 F (36.7 C) (Oral)   Resp 16   Ht 5' 6.14" (1.68 m)   Wt 147 lb (66.7 kg)   SpO2 100%   BMI 23.62 kg/m  Physical Exam  Constitutional: She is oriented to person, place, and time. She appears well-developed and well-nourished. No distress.  HENT:  Head: Normocephalic and atraumatic.  Right Ear: External ear normal.  Left Ear: External ear normal.  Nose: Nose normal.  Mouth/Throat: Oropharynx is clear and moist.  Eyes: Conjunctivae and EOM are normal. Pupils are equal, round, and reactive to light.  Neck: Normal range of motion. Neck supple. Carotid bruit is not present. No thyromegaly present.  Cardiovascular: Normal rate, regular rhythm, normal heart sounds and intact distal pulses. Exam reveals no gallop and no friction rub.  No murmur heard. Pulmonary/Chest: Effort normal and breath sounds normal. She has no wheezes. She has no rales.  Abdominal: Soft. Bowel sounds are normal. She exhibits no distension and no mass. There is no tenderness. There is no rebound and no  guarding.  Musculoskeletal:       Right shoulder: She exhibits decreased range of motion, tenderness, pain and decreased strength. She exhibits no bony tenderness, no spasm and normal pulse.       Cervical back: Normal. She exhibits normal range of motion and no tenderness.       Lumbar back: She exhibits tenderness, pain and spasm. She exhibits normal range of motion, no bony tenderness and normal pulse.  Lymphadenopathy:    She has no cervical adenopathy.  Neurological: She is alert and oriented to person, place, and time. No cranial nerve deficit.  Skin: Skin is warm and dry. No rash noted. She is not diaphoretic. No erythema. No pallor.  Psychiatric: She has a normal mood and affect. Her behavior is normal.   No results found. Depression screen [facility] 2/9 01/23/2018 06/30/2017 07/29/2016 06/16/2016 09/24/2015  Decreased Interest 0 0 0 0 0  Down, Depressed, Hopeless 0 0 0 0 0  PHQ - 2 Score 0 0 0 0 0   Fall Risk  01/23/2018 06/30/2017 07/29/2016 06/16/2016 09/24/2015  Falls in the past year? Yes Yes No No No  Comment - - - - -  Number falls in past yr: 1 - - - -  Injury with Fall? - - - - -  Comment - - - - -        Assessment & Plan:   1. Essential hypertension   2. Seizure disorder ([place])   3. Anemia, unspecified type   4. Anxiety   5. Nonintractable headache, unspecified chronicity pattern, unspecified headache type   6. Screening, lipid   7. Pain in joint of right shoulder   8. Right sided sciatica   9. Lumbosacral disc disease    Hypertension well controlled.  Obtain labs for chronic disease management.  Recommend monitoring blood pressure once weekly due to recent weight loss.  Seizure disorder: No recurrent seizures since weaning Keppra.  No recent neurology follow-up.  Per patient, seizure episode likely secondary to tramadol and Wellbutrin use.  Anxiety: Stable at this time.  Recommend increasing Prozac dose to 40 mg daily and decreasing Xanax use to 3 daily.  Discussed  with negative long-term effects of chronic benzodiazepine use including dementia.  Will wean Xanax very slowly at each visit.  Recommend exercise for stress and anxiety management.  Recurrent right sciatica: Obtain lumbar spine films.  Prescription for Prednisone and Robaxin provided.  Patient desires trial of home exercise program daily.  If no improvement in 1 month, refer to physical therapy and/or orthopedics.  Right shoulder sprain: New onset.  Patient reports improvement in frozen shoulder.  Decreased range of motion on examination.  Obtain right shoulder films to rule out pathology.  Continue with home exercise program.  Prednisone for acute inflammation.  Can use meloxicam as needed after prednisone therapy completed.  Iron deficiency anemia: Obtain CBC.  Critical lab is value called into the office with a hemoglobin of 4.  Patient's husband contacted to advised to present to emergency room immediately for verification of the lab and blood transfusion.  -prolonged face-to-face for 40 minutes with greater than 50% of time dedicated to counseling and coordination of care.  Orders Placed This Encounter  Procedures  . DG Lumbar Spine Complete    Standing Status:   Future    Number of Occurrences:   1    Standing Expiration Date:   01/23/2019    Order Specific Question:   Reason for Exam (SYMPTOM  OR DIAGNOSIS REQUIRED)    Answer:   low back pain with radicular symptoms B    Order Specific Question:   Is the patient pregnant?    Answer:   No    Order Specific Question:   Preferred imaging location?    Answer:   External  . DG Shoulder Right    Standing Status:   Future    Number of Occurrences:   1    Standing Expiration Date:   01/23/2019    Order Specific Question:   Reason for Exam (SYMPTOM  OR DIAGNOSIS REQUIRED)    Answer:   R shoulder pain anterior shoulder    Order Specific Question:   Is the patient pregnant?    Answer:   No    Order Specific Question:   Preferred imaging  location?    Answer:   External  . CBC with Differential/Platelet  . Comprehensive metabolic panel    Order Specific Question:   Has the patient fasted?    Answer:   No  . Lipid panel    Order Specific Question:   Has the patient fasted?    Answer:   No  . TSH  . POCT urinalysis dipstick   Meds ordered this encounter  Medications  . predniSONE (DELTASONE) 20 MG tablet    Sig: Take 3 PO QAM x 1 day, 2 PO QAM x 5 days, 1 PO QAM x 5 days    Dispense:  18 tablet    Refill:  0  . cyclobenzaprine (FLEXERIL) 10 MG tablet    Sig: TAKE 1 TABLET BY MOUTH AT BEDTIME.    Dispense:  90 tablet    Refill:  0  . ALPRAZolam (XANAX) 0.5 MG tablet    Sig: 1 AT MIDDAY AND 2 TABLETS AT BEDTIME    Dispense:  90 tablet    Refill:  5  . meloxicam (MOBIC) 15 MG tablet    Sig: Take 1 tablet (15 mg total) by mouth daily.    Dispense:  60 tablet    Refill:  0  . lisinopril-hydrochlorothiazide (PRINZIDE,ZESTORETIC) 10-12.5 MG tablet    Sig: TAKE 1 TABLET BY MOUTH DAILY  Dispense:  90 tablet    Refill:  1  . amLODipine (NORVASC) 5 MG tablet    Sig: Take 1 tablet (5 mg total) by mouth daily.    Dispense:  90 tablet    Refill:  1  . FLUoxetine (PROZAC) 40 MG capsule    Sig: Take 1 capsule (40 mg total) by mouth daily.    Dispense:  90 capsule    Refill:  1    Return in about 6 months (around 07/23/2018) for follow-up chronic medical conditions.   Olubunmi Rothenberger Elayne Guerin, M.D. [facility] [address] [place], [place]  [address] [phone] phone [phone] fax

## 2018-01-23 ENCOUNTER — Ambulatory Visit (INDEPENDENT_AMBULATORY_CARE_PROVIDER_SITE_OTHER): Payer: Federal, State, Local not specified - PPO

## 2018-01-23 ENCOUNTER — Other Ambulatory Visit: Payer: Self-pay

## 2018-01-23 ENCOUNTER — Ambulatory Visit: Payer: Federal, State, Local not specified - PPO | Admitting: Family Medicine

## 2018-01-23 ENCOUNTER — Encounter: Payer: Self-pay | Admitting: Family Medicine

## 2018-01-23 VITALS — BP 130/70 | HR 130 | Temp 98.0°F | Resp 16 | Ht 66.14 in | Wt 147.0 lb

## 2018-01-23 DIAGNOSIS — M545 Low back pain: Secondary | ICD-10-CM | POA: Diagnosis not present

## 2018-01-23 DIAGNOSIS — Z13220 Encounter for screening for lipoid disorders: Secondary | ICD-10-CM | POA: Diagnosis not present

## 2018-01-23 DIAGNOSIS — F419 Anxiety disorder, unspecified: Secondary | ICD-10-CM | POA: Diagnosis not present

## 2018-01-23 DIAGNOSIS — M25511 Pain in right shoulder: Secondary | ICD-10-CM

## 2018-01-23 DIAGNOSIS — R519 Headache, unspecified: Secondary | ICD-10-CM

## 2018-01-23 DIAGNOSIS — D649 Anemia, unspecified: Secondary | ICD-10-CM

## 2018-01-23 DIAGNOSIS — M519 Unspecified thoracic, thoracolumbar and lumbosacral intervertebral disc disorder: Secondary | ICD-10-CM | POA: Diagnosis not present

## 2018-01-23 DIAGNOSIS — M5431 Sciatica, right side: Secondary | ICD-10-CM | POA: Diagnosis not present

## 2018-01-23 DIAGNOSIS — I10 Essential (primary) hypertension: Secondary | ICD-10-CM | POA: Diagnosis not present

## 2018-01-23 DIAGNOSIS — R51 Headache: Secondary | ICD-10-CM

## 2018-01-23 DIAGNOSIS — Z9884 Bariatric surgery status: Secondary | ICD-10-CM

## 2018-01-23 DIAGNOSIS — G40909 Epilepsy, unspecified, not intractable, without status epilepticus: Secondary | ICD-10-CM | POA: Diagnosis not present

## 2018-01-23 LAB — POCT URINALYSIS DIP (MANUAL ENTRY)
BILIRUBIN UA: NEGATIVE
Blood, UA: NEGATIVE
GLUCOSE UA: NEGATIVE mg/dL
Ketones, POC UA: NEGATIVE mg/dL
Leukocytes, UA: NEGATIVE
NITRITE UA: NEGATIVE
Protein Ur, POC: 30 mg/dL — AB
SPEC GRAV UA: 1.015 (ref 1.010–1.025)
UROBILINOGEN UA: 0.2 U/dL
pH, UA: 6 (ref 5.0–8.0)

## 2018-01-23 MED ORDER — PREDNISONE 20 MG PO TABS: ORAL_TABLET | ORAL | 0 refills | Status: DC

## 2018-01-23 MED ORDER — CYCLOBENZAPRINE HCL 10 MG PO TABS: ORAL_TABLET | ORAL | 0 refills | Status: DC

## 2018-01-23 MED ORDER — ALPRAZOLAM 0.5 MG PO TABS: ORAL_TABLET | ORAL | 5 refills | Status: DC

## 2018-01-23 MED ORDER — FLUOXETINE HCL 40 MG PO CAPS: 40.0000 mg | ORAL_CAPSULE | Freq: Every day | ORAL | 1 refills | Status: DC

## 2018-01-23 MED ORDER — LISINOPRIL-HYDROCHLOROTHIAZIDE 10-12.5 MG PO TABS: ORAL_TABLET | ORAL | 1 refills | Status: DC

## 2018-01-23 MED ORDER — AMLODIPINE BESYLATE 5 MG PO TABS: 5.0000 mg | ORAL_TABLET | Freq: Every day | ORAL | 1 refills | Status: DC

## 2018-01-23 MED ORDER — MELOXICAM 15 MG PO TABS: 15.0000 mg | ORAL_TABLET | Freq: Every day | ORAL | 0 refills | Status: DC

## 2018-01-23 NOTE — Patient Instructions (Addendum)
   IF you received an x-ray today, you will receive an invoice from [facility]. Please contact [facility] at [phone] with questions or concerns regarding your invoice.   IF you received labwork today, you will receive an invoice from [facility]. Please contact [facility] at [phone] with questions or concerns regarding your invoice.   Our billing staff will not be able to assist you with questions regarding bills from these companies.  You will be contacted with the lab results as soon as they are available. The fastest way to get your results is to activate your My Chart account. Instructions are located on the last page of this paperwork. If you have not heard from us regarding the results in 2 weeks, please contact this office.     Sciatica Rehab Ask your health care provider which exercises are safe for you. Do exercises exactly as told by your health care provider and adjust them as directed. It is normal to feel mild stretching, pulling, tightness, or discomfort as you do these exercises, but you should stop right away if you feel sudden pain or your pain gets worse.Do not begin these exercises until told by your health care provider. Stretching and range of motion exercises These exercises warm up your muscles and joints and improve the movement and flexibility of your hips and your back. These exercises also help to relieve pain, numbness, and tingling. Exercise A: Sciatic nerve glide 1. Sit in a chair with your head facing down toward your chest. Place your hands behind your back. Let your shoulders slump forward. 2. Slowly straighten one of your knees while you tilt your head back as if you are looking toward the ceiling. Only straighten your leg as far as you can without making your symptoms worse. 3. Hold for __________ seconds. 4. Slowly return to the starting position. 5. Repeat with your other leg. Repeat __________ times. Complete this exercise  __________ times a day. Exercise B: Knee to chest with hip adduction and internal rotation  1. Lie on your back on a firm surface with both legs straight. 2. Bend one of your knees and move it up toward your chest until you feel a gentle stretch in your lower back and buttock. Then, move your knee toward the shoulder that is on the opposite side from your leg. ? Hold your leg in this position by holding onto the front of your knee. 3. Hold for __________ seconds. 4. Slowly return to the starting position. 5. Repeat with your other leg. Repeat __________ times. Complete this exercise __________ times a day. Exercise C: Prone extension on elbows  1. Lie on your abdomen on a firm surface. A bed may be too soft for this exercise. 2. Prop yourself up on your elbows. 3. Use your arms to help lift your chest up until you feel a gentle stretch in your abdomen and your lower back. ? This will place some of your body weight on your elbows. If this is uncomfortable, try stacking pillows under your chest. ? Your hips should stay down, against the surface that you are lying on. Keep your hip and back muscles relaxed. 4. Hold for __________ seconds. 5. Slowly relax your upper body and return to the starting position. Repeat __________ times. Complete this exercise __________ times a day. Strengthening exercises These exercises build strength and endurance in your back. Endurance is the ability to use your muscles for a long time, even after they get tired. Exercise D: Pelvic   tilt 1. Lie on your back on a firm surface. Bend your knees and keep your feet flat. 2. Tense your abdominal muscles. Tip your pelvis up toward the ceiling and flatten your lower back into the floor. ? To help with this exercise, you may place a small towel under your lower back and try to push your back into the towel. 3. Hold for __________ seconds. 4. Let your muscles relax completely before you repeat this exercise. Repeat  __________ times. Complete this exercise __________ times a day. Exercise E: Alternating arm and leg raises  1. Get on your hands and knees on a firm surface. If you are on a hard floor, you may want to use padding to cushion your knees, such as an exercise mat. 2. Line up your arms and legs. Your hands should be below your shoulders, and your knees should be below your hips. 3. Lift your left leg behind you. At the same time, raise your right arm and straighten it in front of you. ? Do not lift your leg higher than your hip. ? Do not lift your arm higher than your shoulder. ? Keep your abdominal and back muscles tight. ? Keep your hips facing the ground. ? Do not arch your back. ? Keep your balance carefully, and do not hold your breath. 4. Hold for __________ seconds. 5. Slowly return to the starting position and repeat with your right leg and your left arm. Repeat __________ times. Complete this exercise __________ times a day. Posture and body mechanics  Body mechanics refers to the movements and positions of your body while you do your daily activities. Posture is part of body mechanics. Good posture and healthy body mechanics can help to relieve stress in your body's tissues and joints. Good posture means that your spine is in its natural S-curve position (your spine is neutral), your shoulders are pulled back slightly, and your head is not tipped forward. The following are general guidelines for applying improved posture and body mechanics to your everyday activities. Standing   When standing, keep your spine neutral and your feet about hip-width apart. Keep a slight bend in your knees. Your ears, shoulders, and hips should line up.  When you do a task in which you stand in one place for a long time, place one foot up on a stable object that is 2-4 inches (5-10 cm) high, such as a footstool. This helps keep your spine neutral. Sitting   When sitting, keep your spine neutral and keep  your feet flat on the floor. Use a footrest, if necessary, and keep your thighs parallel to the floor. Avoid rounding your shoulders, and avoid tilting your head forward.  When working at a desk or a computer, keep your desk at a height where your hands are slightly lower than your elbows. Slide your chair under your desk so you are close enough to maintain good posture.  When working at a computer, place your monitor at a height where you are looking straight ahead and you do not have to tilt your head forward or downward to look at the screen. Resting   When lying down and resting, avoid positions that are most painful for you.  If you have pain with activities such as sitting, bending, stooping, or squatting (flexion-based activities), lie in a position in which your body does not bend very much. For example, avoid curling up on your side with your arms and knees near your chest (fetal position).    If you have pain with activities such as standing for a long time or reaching with your arms (extension-based activities), lie with your spine in a neutral position and bend your knees slightly. Try the following positions: ? Lying on your side with a pillow between your knees. ? Lying on your back with a pillow under your knees. Lifting   When lifting objects, keep your feet at least shoulder-width apart and tighten your abdominal muscles.  Bend your knees and hips and keep your spine neutral. It is important to lift using the strength of your legs, not your back. Do not lock your knees straight out.  Always ask for help to lift heavy or awkward objects. This information is not intended to replace advice given to you by your health care provider. Make sure you discuss any questions you have with your health care provider. Document Released: 11/22/2005 Document Revised: 07/29/2016 Document Reviewed: 08/08/2015 Elsevier Interactive Patient Education  2018 [employer]  

## 2018-01-24 ENCOUNTER — Telehealth: Payer: Self-pay | Admitting: Family Medicine

## 2018-01-24 ENCOUNTER — Ambulatory Visit: Payer: Self-pay | Admitting: *Deleted

## 2018-01-24 LAB — LIPID PANEL
CHOL/HDL RATIO: 1.6 ratio (ref 0.0–4.4)
Cholesterol, Total: 87 mg/dL — ABNORMAL LOW (ref 100–199)
HDL: 54 mg/dL (ref >39)
LDL Calculated: 23 mg/dL (ref 0–99)
TRIGLYCERIDES: 52 mg/dL (ref 0–149)
VLDL CHOLESTEROL CAL: 10 mg/dL (ref 5–40)

## 2018-01-24 LAB — CBC WITH DIFFERENTIAL/PLATELET
BASOS: 0 %
Basophils Absolute: 0 10*3/uL (ref 0.0–0.2)
EOS (ABSOLUTE): 0.1 10*3/uL (ref 0.0–0.4)
EOS: 1 %
Hematocrit: 16.9 % — CL (ref 34.0–46.6)
Hemoglobin: 4.3 g/dL — CL (ref 11.1–15.9)
IMMATURE GRANULOCYTES: 0 %
Immature Grans (Abs): 0 10*3/uL (ref 0.0–0.1)
LYMPHS: 15 %
Lymphocytes Absolute: 1.5 10*3/uL (ref 0.7–3.1)
MCH: 17.7 pg — ABNORMAL LOW (ref 26.6–33.0)
MCHC: 25.4 g/dL — ABNORMAL LOW (ref 31.5–35.7)
MCV: 70 fL — AB (ref 79–97)
MONOCYTES: 5 %
MONOS ABS: 0.5 10*3/uL (ref 0.1–0.9)
NEUTROS PCT: 79 %
Neutrophils Absolute: 8.1 10*3/uL — ABNORMAL HIGH (ref 1.4–7.0)
PLATELETS: 215 10*3/uL (ref 150–379)
RBC: 2.43 x10E6/uL — AB (ref 3.77–5.28)
RDW: 27.8 % — AB (ref 12.3–15.4)
WBC: 10.3 10*3/uL (ref 3.4–10.8)

## 2018-01-24 LAB — COMPREHENSIVE METABOLIC PANEL
A/G RATIO: 1.2 (ref 1.2–2.2)
ALT: 5 IU/L (ref 0–32)
AST: 10 IU/L (ref 0–40)
Albumin: 4.3 g/dL (ref 3.5–5.5)
Alkaline Phosphatase: 85 IU/L (ref 39–117)
BUN/Creatinine Ratio: 14 (ref 9–23)
BUN: 13 mg/dL (ref 6–24)
Bilirubin Total: 0.3 mg/dL (ref 0.0–1.2)
CALCIUM: 8.7 mg/dL (ref 8.7–10.2)
CO2: 15 mmol/L — CL (ref 20–29)
Chloride: 104 mmol/L (ref 96–106)
Creatinine, Ser: 0.96 mg/dL (ref 0.57–1.00)
GFR, EST AFRICAN AMERICAN: 84 mL/min/{1.73_m2} (ref >59)
GFR, EST NON AFRICAN AMERICAN: 73 mL/min/{1.73_m2} (ref >59)
GLUCOSE: 100 mg/dL — AB (ref 65–99)
Globulin, Total: 3.5 g/dL (ref 1.5–4.5)
POTASSIUM: 4.1 mmol/L (ref 3.5–5.2)
Sodium: 135 mmol/L (ref 134–144)
TOTAL PROTEIN: 7.8 g/dL (ref 6.0–8.5)

## 2018-01-24 LAB — TSH: TSH: 0.987 u[IU]/mL (ref 0.450–4.500)

## 2018-01-24 NOTE — Telephone Encounter (Signed)
Left message on voicemail asking patient to return call immediately to office.  Called emergency contact/husband/Jeffery Mayford KnifeWilliams; advised of critical lab values.  Advised husband/Mr. Hindley of Hgb of 4.2 and need for patient to present to emergency department for repeat labs and blood transfusion.  Left message also on work number of [phone].  Husband to also call patient to advise of lab results.  Please attempt to call patient again with results and instructions to present to emergency department.

## 2018-01-24 NOTE — Telephone Encounter (Signed)
Spoke with Tammy [name] and advised her to go to the ER today for repeat labs, and transfusion. She verbalized understanding and states she will go later in the day and if she does not make it today she will go first thing in the Morning. Advised Tammy [name] of Dr. Katrinka BlazingSmith recommendations and she verbalized understanding.

## 2018-01-24 NOTE — Telephone Encounter (Signed)
[facility]. She is calling to report patient critical lab values of RBC 2.43, hemoglobin 4.3, and hematocrit of 16.9. Provider, Lorain ChildesFYI.

## 2018-01-24 NOTE — Telephone Encounter (Signed)
Tina from [facility] reports RBC 2.43 drawn 01/23/18 at 1151;Lizzie at [facility] given lab values and will relay them to provider; will also route this encounter for notification.

## 2018-01-25 ENCOUNTER — Observation Stay (HOSPITAL_COMMUNITY)
Admission: EM | Admit: 2018-01-25 | Discharge: 2018-01-26 | Disposition: A | Discharge status: Home / Self Care | Payer: Federal, State, Local not specified - PPO | Attending: Internal Medicine | Admitting: Internal Medicine

## 2018-01-25 ENCOUNTER — Encounter (HOSPITAL_COMMUNITY): Payer: Self-pay | Admitting: *Deleted

## 2018-01-25 ENCOUNTER — Other Ambulatory Visit: Payer: Self-pay

## 2018-01-25 DIAGNOSIS — D649 Anemia, unspecified: Secondary | ICD-10-CM | POA: Diagnosis present

## 2018-01-25 DIAGNOSIS — M25511 Pain in right shoulder: Secondary | ICD-10-CM | POA: Diagnosis not present

## 2018-01-25 DIAGNOSIS — F419 Anxiety disorder, unspecified: Secondary | ICD-10-CM | POA: Diagnosis not present

## 2018-01-25 DIAGNOSIS — G40909 Epilepsy, unspecified, not intractable, without status epilepticus: Secondary | ICD-10-CM | POA: Insufficient documentation

## 2018-01-25 DIAGNOSIS — D509 Iron deficiency anemia, unspecified: Principal | ICD-10-CM | POA: Diagnosis present

## 2018-01-25 DIAGNOSIS — G8929 Other chronic pain: Secondary | ICD-10-CM | POA: Diagnosis present

## 2018-01-25 DIAGNOSIS — Z79899 Other long term (current) drug therapy: Secondary | ICD-10-CM | POA: Insufficient documentation

## 2018-01-25 DIAGNOSIS — Z9884 Bariatric surgery status: Secondary | ICD-10-CM | POA: Diagnosis not present

## 2018-01-25 DIAGNOSIS — R51 Headache: Secondary | ICD-10-CM | POA: Diagnosis not present

## 2018-01-25 DIAGNOSIS — I10 Essential (primary) hypertension: Secondary | ICD-10-CM | POA: Diagnosis not present

## 2018-01-25 DIAGNOSIS — R799 Abnormal finding of blood chemistry, unspecified: Secondary | ICD-10-CM | POA: Diagnosis not present

## 2018-01-25 DIAGNOSIS — R519 Headache, unspecified: Secondary | ICD-10-CM | POA: Diagnosis present

## 2018-01-25 DIAGNOSIS — Z8669 Personal history of other diseases of the nervous system and sense organs: Secondary | ICD-10-CM

## 2018-01-25 HISTORY — DX: Other chronic pain: G89.29

## 2018-01-25 HISTORY — DX: Pain in right shoulder: M25.511

## 2018-01-25 HISTORY — DX: Migraine, unspecified, not intractable, without status migrainosus: G43.909

## 2018-01-25 HISTORY — DX: Iron deficiency anemia, unspecified: D50.9

## 2018-01-25 HISTORY — DX: Personal history of other diseases of the nervous system and sense organs: Z86.69

## 2018-01-25 LAB — COMPREHENSIVE METABOLIC PANEL
ALK PHOS: 72 U/L (ref 38–126)
ALT: 8 U/L — ABNORMAL LOW (ref 14–54)
ANION GAP: 10 (ref 5–15)
AST: 12 U/L — ABNORMAL LOW (ref 15–41)
Albumin: 3.6 g/dL (ref 3.5–5.0)
BUN: 16 mg/dL (ref 6–20)
CALCIUM: 9.2 mg/dL (ref 8.9–10.3)
CO2: 21 mmol/L — ABNORMAL LOW (ref 22–32)
Chloride: 106 mmol/L (ref 101–111)
Creatinine, Ser: 0.7 mg/dL (ref 0.44–1.00)
GFR calc non Af Amer: >60 mL/min (ref >60)
Glucose, Bld: 98 mg/dL (ref 65–99)
Potassium: 3.6 mmol/L (ref 3.5–5.1)
SODIUM: 137 mmol/L (ref 135–145)
TOTAL PROTEIN: 7.5 g/dL (ref 6.5–8.1)
Total Bilirubin: 0.2 mg/dL — ABNORMAL LOW (ref 0.3–1.2)

## 2018-01-25 LAB — CBC
HCT: 16.8 % — ABNORMAL LOW (ref 36.0–46.0)
HEMATOCRIT: 23.1 % — AB (ref 36.0–46.0)
HEMOGLOBIN: 4.2 g/dL — AB (ref 12.0–15.0)
HEMOGLOBIN: 6.6 g/dL — AB (ref 12.0–15.0)
MCH: 18.1 pg — ABNORMAL LOW (ref 26.0–34.0)
MCH: 21.2 pg — AB (ref 26.0–34.0)
MCHC: 25 g/dL — ABNORMAL LOW (ref 30.0–36.0)
MCHC: 28.6 g/dL — ABNORMAL LOW (ref 30.0–36.0)
MCV: 72.4 fL — ABNORMAL LOW (ref 78.0–100.0)
MCV: 74.3 fL — AB (ref 78.0–100.0)
Platelets: 237 10*3/uL (ref 150–400)
Platelets: 238 10*3/uL (ref 150–400)
RBC: 2.32 MIL/uL — ABNORMAL LOW (ref 3.87–5.11)
RBC: 3.11 MIL/uL — ABNORMAL LOW (ref 3.87–5.11)
RDW: 29.3 % — ABNORMAL HIGH (ref 11.5–15.5)
RDW: 25.3 % — AB (ref 11.5–15.5)
WBC: 11.5 10*3/uL — AB (ref 4.0–10.5)
WBC: 10.4 10*3/uL (ref 4.0–10.5)

## 2018-01-25 LAB — PREPARE RBC (CROSSMATCH)

## 2018-01-25 LAB — IRON AND TIBC
Iron: 6 ug/dL — ABNORMAL LOW (ref 28–170)
SATURATION RATIOS: 1 % — AB (ref 10.4–31.8)
TIBC: 517 ug/dL — ABNORMAL HIGH (ref 250–450)
UIBC: 511 ug/dL

## 2018-01-25 LAB — I-STAT BETA HCG BLOOD, ED (MC, WL, AP ONLY)

## 2018-01-25 LAB — FERRITIN: Ferritin: 4 ng/mL — ABNORMAL LOW (ref 11–307)

## 2018-01-25 LAB — VITAMIN B12: Vitamin B-12: 341 pg/mL (ref 180–914)

## 2018-01-25 LAB — POC OCCULT BLOOD, ED: Fecal Occult Bld: NEGATIVE

## 2018-01-25 LAB — FOLATE: FOLATE: 5.8 ng/mL — AB (ref >5.9)

## 2018-01-25 MED ORDER — ONDANSETRON HCL 4 MG PO TABS: 4.0000 mg | ORAL_TABLET | Freq: Four times a day (QID) | ORAL | Status: DC | PRN

## 2018-01-25 MED ORDER — ONDANSETRON HCL 4 MG/2ML IJ SOLN: 4.0000 mg | Freq: Four times a day (QID) | INTRAMUSCULAR | Status: DC | PRN

## 2018-01-25 MED ORDER — CYCLOBENZAPRINE HCL 10 MG PO TABS
10.0000 mg | ORAL_TABLET | Freq: Every evening | ORAL | Status: DC | PRN
  Administered 2018-01-25: 10 mg via ORAL
  Filled 2018-01-25: qty 1

## 2018-01-25 MED ORDER — ACETAMINOPHEN 325 MG PO TABS
650.0000 mg | ORAL_TABLET | Freq: Four times a day (QID) | ORAL | Status: DC | PRN
  Administered 2018-01-25: 650 mg via ORAL
  Filled 2018-01-25: qty 2

## 2018-01-25 MED ORDER — ADULT MULTIVITAMIN W/MINERALS CH
1.0000 | ORAL_TABLET | Freq: Every day | ORAL | Status: DC
  Administered 2018-01-26: 1 via ORAL
  Filled 2018-01-25 (×2): qty 1

## 2018-01-25 MED ORDER — SODIUM CHLORIDE 0.9% FLUSH
3.0000 mL | Freq: Two times a day (BID) | INTRAVENOUS | Status: DC
  Administered 2018-01-26: 3 mL via INTRAVENOUS

## 2018-01-25 MED ORDER — ALPRAZOLAM 0.5 MG PO TABS
0.5000 mg | ORAL_TABLET | Freq: Every day | ORAL | Status: DC
  Administered 2018-01-25 – 2018-01-26 (×2): 0.5 mg via ORAL
  Filled 2018-01-25 (×2): qty 1

## 2018-01-25 MED ORDER — FLUOXETINE HCL 20 MG PO CAPS
40.0000 mg | ORAL_CAPSULE | Freq: Every day | ORAL | Status: DC
  Administered 2018-01-26: 40 mg via ORAL
  Filled 2018-01-25 (×2): qty 2

## 2018-01-25 MED ORDER — FERROUS GLUCONATE 324 (38 FE) MG PO TABS
324.0000 mg | ORAL_TABLET | Freq: Three times a day (TID) | ORAL | Status: DC
  Administered 2018-01-25 – 2018-01-26 (×3): 324 mg via ORAL
  Filled 2018-01-25 (×5): qty 1

## 2018-01-25 MED ORDER — FERROUS SULFATE 325 (65 FE) MG PO TABS: 325.0000 mg | ORAL_TABLET | Freq: Every day | ORAL | Status: DC

## 2018-01-25 MED ORDER — ALPRAZOLAM 0.5 MG PO TABS
1.0000 mg | ORAL_TABLET | Freq: Every day | ORAL | Status: DC
  Administered 2018-01-25: 1 mg via ORAL
  Filled 2018-01-25: qty 2

## 2018-01-25 MED ORDER — ACETAMINOPHEN 650 MG RE SUPP
650.0000 mg | Freq: Four times a day (QID) | RECTAL | Status: DC | PRN
Start: 2018-01-25 — End: 2018-01-26

## 2018-01-25 MED ORDER — SODIUM CHLORIDE 0.9 % IV SOLN
10.0000 mL/h | Freq: Once | INTRAVENOUS | Status: AC
  Administered 2018-01-25 (×2): 10 mL/h via INTRAVENOUS

## 2018-01-25 NOTE — ED Triage Notes (Signed)
Pt reports going for checkup Monday and was called due to critical Hgb of 4.3. Hx of anemia, only complaint is fatigue.

## 2018-01-25 NOTE — ED Provider Notes (Signed)
[facility] EMERGENCY DEPARTMENT Provider Note   CSN: [id number] Arrival date & time: 01/25/18  0807     History   Chief Complaint Chief Complaint  Patient presents with  . Abnormal Lab    HPI Tammy [name] is a 42 y.o. female.  HPI Tammy [name] is a 42 y.o. female with history of hypertension, anemia, status post gastric bypass, presents to emergency department with complaint of low hemoglobin.  Patient states that she went to her family doctor with an related complaints 2 days ago, states had blood work drawn and was called yesterday and told that her hemoglobin was 4.3.  She reports history of anemia in the past, requiring transfusion.  She states at that time she was told it was most likely due to her fibroids.  She states she had an ablation, and currently does not have heavy periods.  She denies feeling excessive fatigue, dizziness, headache, shortness of breath.  She takes iron and multivitamin daily and reports full compliance with these medications.  She denies any change in her stools, states that her stool has been normal color.  She denies any abdominal pain.  No nausea or vomiting.  No  Past Medical History:  Diagnosis Date  . Allergy   . Anemia   . Anxiety   . Blood transfusion without reported diagnosis   . Hypertension   . SVD (spontaneous vaginal delivery)    x 5    Patient Active Problem List   Diagnosis Date Noted  . HA (headache) 08/14/2014  . Seizure disorder ([place]) 08/06/2013  . HTN (hypertension) 03/14/2013  . Anxiety 10/25/2012  . Anemia 08/31/2012  . Lumbosacral disc disease 07/23/2012  . Status post gastric bypass for obesity 07/23/2012    Past Surgical History:  Procedure Laterality Date  . DILITATION & CURRETTAGE/HYSTROSCOPY WITH HYDROTHERMAL ABLATION  10/11/2012   Procedure: DILATATION & CURETTAGE/HYSTEROSCOPY WITH HYDROTHERMAL ABLATION;  Surgeon: Frederico Hamman, MD;  Location: [facility];  Service: Gynecology;   Laterality: N/A;  . 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 GASTRIC OUTLET OBSTRUCTION RELEASE  2005   post gastric bypass  . TUBAL LIGATION  2002    OB History    Gravida Para Term Preterm AB Living   _0 SAB TAB Ectopic Multiple Live Births                   Home Medications    Prior to Admission medications   Medication Sig Start Date End Date Taking? Authorizing Provider  ALPRAZolam Duanne Moron) 0.5 MG tablet 1 AT MIDDAY AND 2 TABLETS AT BEDTIME 01/23/18   Wardell Honour, MD  amLODipine (NORVASC) 5 MG tablet Take 1 tablet (5 mg total) by mouth daily. 01/23/18   Wardell Honour, MD  Calcium Carbonate-Vit D-Min (CALCIUM 1200 PO) Take by mouth.    [provider]  Cyanocobalamin (VITAMIN B 12 PO) Take by mouth.    [provider]  cyclobenzaprine (FLEXERIL) 10 MG tablet TAKE 1 TABLET BY MOUTH AT BEDTIME. 01/23/18   Wardell Honour, MD  ferrous sulfate 325 (65 FE) MG tablet Take 325 mg by mouth daily with breakfast.    [provider]  FLUoxetine (PROZAC) 40 MG capsule Take 1 capsule (40 mg total) by mouth daily. 01/23/18   Wardell Honour, MD  lisinopril-hydrochlorothiazide (PRINZIDE,ZESTORETIC) 10-12.5 MG tablet TAKE 1 TABLET BY MOUTH DAILY 01/23/18  Wardell Honour, MD  meloxicam (MOBIC) 15 MG tablet Take 1 tablet (15 mg total) by mouth daily. 01/23/18   Wardell Honour, MD  Multiple Vitamin (MULTIVITAMIN) tablet Take 1 tablet by mouth daily.    [provider]  predniSONE (DELTASONE) 20 MG tablet Take 3 PO QAM x 1 day, 2 PO QAM x 5 days, 1 PO QAM x 5 days 01/23/18   Wardell Honour, MD    Family History Family History  Problem Relation Age of Onset  . Hypertension Mother   . Hepatitis Mother   . Hyperthyroidism Mother   . Heart disease Mother   . Stroke Mother   . [place] disease Mother   . Depression Mother   . Obesity Sister   . Hypertension Sister   . Cancer Maternal Grandmother        uterine  .  Heart disease Maternal Grandmother   . Hypertension Maternal Grandmother   . Cancer Maternal Grandfather        multiple myeloma  . Hypertension Maternal Grandfather   . Bipolar disorder Father   . Hypertension Father   . Hepatitis Father   . Migraines Daughter     Social History Social History   Tobacco Use  . Smoking status: Never Smoker  . Smokeless tobacco: Never Used  Substance Use Topics  . Alcohol use: Yes    Comment: rare   . Drug use: No     Allergies   Nsaids   Review of Systems Review of Systems  Constitutional: Negative for chills and fever.  Respiratory: Negative for cough, chest tightness and shortness of breath.   Cardiovascular: Negative for chest pain, palpitations and leg swelling.  Gastrointestinal: Negative for abdominal pain, diarrhea, nausea and vomiting.  Genitourinary: Negative for dysuria, flank pain, pelvic pain, vaginal bleeding, vaginal discharge and vaginal pain.  Musculoskeletal: Negative for arthralgias, myalgias, neck pain and neck stiffness.  Skin: Positive for pallor. Negative for rash.  Neurological: Negative for dizziness, weakness and headaches.  All other systems reviewed and are negative.    Physical Exam Updated Vital Signs BP (!) 110/42   Pulse 96   Temp 98.2 F (36.8 C) (Oral)   Resp 15   SpO2 100%   Physical Exam  Constitutional: She appears well-developed and well-nourished. No distress.  HENT:  Head: Normocephalic.  Oral mucosa is pale  Eyes: Conjunctivae are normal.  Pale conjunctiva  Neck: Neck supple.  Cardiovascular: Normal rate, regular rhythm and normal heart sounds.  Pulmonary/Chest: Effort normal and breath sounds normal. No respiratory distress. She has no wheezes. She has no rales.  Abdominal: Soft. Bowel sounds are normal. She exhibits no distension. There is no tenderness. There is no rebound.  Musculoskeletal: She exhibits no edema.  Neurological: She is alert.  Skin: Skin is warm and dry. There  is pallor.  Psychiatric: She has a normal mood and affect. Her behavior is normal.  Nursing note and vitals reviewed.    ED Treatments / Results  Labs (all labs ordered are listed, but only abnormal results are displayed) Labs Reviewed  COMPREHENSIVE METABOLIC PANEL - Abnormal; Notable for the following components:      Result Value   CO2 21 (*)    AST 12 (*)    ALT 8 (*)    Total Bilirubin 0.2 (*)    All other components within normal limits  CBC - Abnormal; Notable for the following components:   WBC 11.5 (*)    RBC 2.32 (*)  Hemoglobin 4.2 (*)    HCT 16.8 (*)    MCV 72.4 (*)    MCH 18.1 (*)    MCHC 25.0 (*)    RDW 29.3 (*)    All other components within normal limits  FOLATE - Abnormal; Notable for the following components:   Folate 5.8 (*)    All other components within normal limits  IRON AND TIBC - Abnormal; Notable for the following components:   Iron 6 (*)    TIBC 517 (*)    Saturation Ratios 1 (*)    All other components within normal limits  FERRITIN - Abnormal; Notable for the following components:   Ferritin 4 (*)    All other components within normal limits  VITAMIN B12  PATHOLOGIST SMEAR REVIEW  HIV ANTIBODY (ROUTINE TESTING)  I-STAT BETA HCG BLOOD, ED ([facility], [facility], [facility] ONLY)  POC OCCULT BLOOD, ED  TYPE AND SCREEN  PREPARE RBC (CROSSMATCH)    EKG  EKG Interpretation None       Radiology Dg Lumbar Spine Complete  Result Date: 01/23/2018 CLINICAL DATA:  Low back pain with radicular symptoms. EXAM: LUMBAR SPINE - COMPLETE 4+ VIEW COMPARISON:  CT abdomen pelvis dated September 10, 2004. FINDINGS: Five lumbar type vertebral bodies. No acute fracture or subluxation. Vertebral body heights are preserved. Alignment is normal. Moderate to severe disc height loss at L5-S1. Remaining intervertebral disc spaces are maintained. Mild bilateral facet arthropathy at L4-L5. No pars defects. IMPRESSION: Moderate to severe degenerative disc disease at L5-S1.  Electronically Signed   By: Titus Dubin M.D.   On: 01/23/2018 12:30   Dg Shoulder Right  Result Date: 01/23/2018 CLINICAL DATA:  Right shoulder pain. EXAM: RIGHT SHOULDER - 2+ VIEW COMPARISON:  None. FINDINGS: No acute fracture or dislocation. Severe glenohumeral joint space narrowing with prominent inferior osteophytes. Mild acromioclavicular joint space narrowing. Soft tissues are unremarkable. IMPRESSION: Severe glenohumeral osteoarthritis.  No acute osseous abnormality. Electronically Signed   By: Titus Dubin M.D.   On: 01/23/2018 12:32    Procedures Procedures (including critical care time)  Medications Ordered in ED Medications - No data to display   Initial Impression / Assessment and Plan / ED Course  I have reviewed the triage vital signs and the nursing notes.  Pertinent labs & imaging results that were available during my care of the patient were reviewed by me and considered in my medical decision making (see chart for details).     Continue emergency department with hemoglobin of 4.3.  Will recheck.  She has history of similar anemia requiring transfusions.  I will order transfusion if her hemoglobin is low today and admit to the hospital.  Will also order anemia panel.  9:41 AM hgb today 4.2. transfusion ordered. Hemoccult negative.   Spoke with [facility]ad, will admit.   Vitals:   01/25/18 1136 01/25/18 1359 01/25/18 1501 01/25/18 1539  BP: 115/78 133/83 114/67 125/71  Pulse: 82 85 100 98  Resp: _0 Temp: 98.6 F (37 C) (!) 97.5 F (36.4 C) 98.4 F (36.9 C) 98.2 F (36.8 C)  TempSrc: Oral Oral Oral Oral  SpO2: 100% 100% 100% 100%      Final Clinical Impressions(s) / ED Diagnoses   Final diagnoses:  Anemia, unspecified type    ED Discharge Orders    None       Jeannett Senior, PA-C 01/25/18 1642    Drenda Freeze, MD 01/26/18 [phone]

## 2018-01-25 NOTE — H&P (Signed)
History and Physical    Tammy [name] RFX:[id number] DOB: September 13, 1976 DOA: 01/25/2018  **Will admit patient based on the expectation that the patient will need hospitalization/ hospital care that crosses at least 2 midnights  PCP: Wardell Honour, MD   Attending physician: Lorin Mercy  Patient coming from/Resides with: Private residence/husband  Chief Complaint: Low hemoglobin on outpatient labs  HPI: Tammy [name] is a 42 y.o. female with medical history significant for gastric bypass surgery, iron deficiency anemia, hypertension, anxiety disorder, history of solitary seizure 2014 no longer on AEDs, chronic right shoulder pain, acute low back pain, chronic migraine.  Patient was evaluated by PCP on 2/18 for complete physical exam.  Was complaining of low back pain so was started on a prednisone taper along with Mobic to use prn.  Labs obtained on that day later revealed a hemoglobin of 4.3.  Once aware of these labs PCP notified patient to present to ER for further evaluation and treatment.  ED Course:  Vital Signs: BP 112/89   Pulse 87   Temp 98.2 F (36.8 C) (Oral)   Resp 16   SpO2 100%  Lab data: Sodium 137, potassium 3.6, chloride 106, CO2 21, glucose 98, BUN 16, creatinine 0.7, calcium 9.2, anion gap 10, LFTs not elevated, white count 11,500, hemoglobin 4.2 with hematocrit 16.8, MCV 92.4, platelets 237,000, anemia panel obtained in the ER, FOB negative Medications and treatments: 2 units PRBCs have been ordered by EDP  Review of Systems:  In addition to the HPI above,  No Fever-chills, myalgias or other constitutional symptoms No Headache, changes with Vision or hearing, new weakness, tingling, numbness in any extremity, dysarthria or word finding difficulty, gait disturbance or imbalance, tremors or seizure activity No problems swallowing food or Liquids, indigestion/reflux, choking or coughing while eating, abdominal pain with or after eating No Chest pain, Cough or  Shortness of Breath, palpitations, orthopnea or DOE No Abdominal pain, N/V, melena,hematochezia, dark tarry stools, constipation No dysuria, malodorous urine, hematuria or flank pain No new skin rashes, lesions, masses or bruises, No swelling or redness of joints No recent unintentional weight gain or loss No polyuria, polydypsia or polyphagia   Past Medical History:  Diagnosis Date  . Allergy   . Anxiety   . Blood transfusion without reported diagnosis   . Chronic right shoulder pain   . Fe deficiency anemia   . History of seizure disorder   . Hypertension   . Migraines   . SVD (spontaneous vaginal delivery)    x 5    Past Surgical History:  Procedure Laterality Date  . DILITATION & CURRETTAGE/HYSTROSCOPY WITH HYDROTHERMAL ABLATION  10/11/2012   Procedure: DILATATION & CURETTAGE/HYSTEROSCOPY WITH HYDROTHERMAL ABLATION;  Surgeon: Frederico Hamman, MD;  Location: [facility];  Service: Gynecology;  Laterality: N/A;  . 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 GASTRIC OUTLET OBSTRUCTION RELEASE  2005   post gastric bypass  . TUBAL LIGATION  2002    Social History   Socioeconomic History  . Marital status: Married    Spouse name: Not on file  . Number of children: 5  . Years of education: some colle  . Highest education level: Not on file  Social Needs  . Financial resource strain: Not on file  . Food insecurity - worry: Not on file  . Food insecurity - inability: Not on file  . Transportation needs - medical: Not on file  . Transportation needs - non-medical: Not  on file  Occupational History  . Occupation: Massage Therapist     Comment: Kneaded Injury   Tobacco Use  . Smoking status: Never Smoker  . Smokeless tobacco: Never Used  Substance and Sexual Activity  . Alcohol use: Yes    Comment: rare   . Drug use: No  . Sexual activity: Yes    Partners: Female    Birth control/protection: Surgical  Other Topics Concern  . Not on file  Social  History Narrative   Marital status: married x 18 years      Children: four children; 1 grandchildren (3)      Lives: with husband, youngest son.      Employment: massage therapist part time in 2019.      Tobacco: none      Alcohol: none      Exercise: walking 30 minutes 3 times per week.     Mobility: Independent Work history: Works as Geophysicist/field seismologist   Allergies  Allergen Reactions  . Nsaids Other (See Comments)    Had gastric bypass sx- Pt has a sensitivity to drug Pt states that she can take NSAIDS but should use them in moderation    Family History  Problem Relation Age of Onset  . Hypertension Mother   . Hepatitis Mother   . Hyperthyroidism Mother   . Heart disease Mother   . Stroke Mother   . [place] disease Mother   . Depression Mother   . Obesity Sister   . Hypertension Sister   . Cancer Maternal Grandmother        uterine  . Heart disease Maternal Grandmother   . Hypertension Maternal Grandmother   . Cancer Maternal Grandfather        multiple myeloma  . Hypertension Maternal Grandfather   . Bipolar disorder Father   . Hypertension Father   . Hepatitis Father   . Migraines Daughter      Prior to Admission medications   Medication Sig Start Date End Date Taking? Authorizing Provider  acetaminophen (TYLENOL) 500 MG tablet Take 1,000 mg by mouth every 6 (six) hours as needed for headache.   Yes [provider]  ALPRAZolam Duanne Moron) 0.5 MG tablet 1 AT MIDDAY AND 2 TABLETS AT BEDTIME 01/23/18  Yes Wardell Honour, MD  amLODipine (NORVASC) 5 MG tablet Take 1 tablet (5 mg total) by mouth daily. 01/23/18  Yes Wardell Honour, MD  Calcium Carbonate-Vit D-Min (CALCIUM 1200 PO) Take 1 tablet by mouth at bedtime.    Yes [provider]  Cyanocobalamin (VITAMIN B 12 PO) Take 1 tablet by mouth daily.    Yes [provider]  cyclobenzaprine (FLEXERIL) 10 MG tablet TAKE 1 TABLET BY MOUTH AT BEDTIME. Patient taking differently: Take 10 mg by  mouth at bedtime as needed for muscle spasms. TAKE 1 TABLET BY MOUTH AT BEDTIME. 01/23/18  Yes Wardell Honour, MD  ferrous sulfate 325 (65 FE) MG tablet Take 325 mg by mouth daily with breakfast.   Yes [provider]  FLUoxetine (PROZAC) 40 MG capsule Take 1 capsule (40 mg total) by mouth daily. 01/23/18  Yes Wardell Honour, MD  lisinopril-hydrochlorothiazide (PRINZIDE,ZESTORETIC) 10-12.5 MG tablet TAKE 1 TABLET BY MOUTH DAILY 01/23/18  Yes Wardell Honour, MD  meloxicam (MOBIC) 15 MG tablet Take 1 tablet (15 mg total) by mouth daily. 01/23/18  Yes Wardell Honour, MD  Multiple Vitamin (MULTIVITAMIN) tablet Take 1 tablet by mouth daily.   Yes [provider]  predniSONE (DELTASONE) 20 MG tablet Take 3 PO QAM x 1 day, 2 PO QAM x 5 days, 1 PO QAM x 5 days Patient taking differently: Take 20-60 mg by mouth See admin instructions. Take 3 tablets by mouth every morning for x 1 day, take 2 tablets by mouth every morning for x 5 days, take 1 tablet by mouth every morning for x 5 days 01/23/18  Yes Wardell Honour, MD    Physical Exam: Vitals:   01/25/18 0945 01/25/18 1000 01/25/18 1015 01/25/18 1057  BP: 130/81 123/77 112/89   Pulse: 95  87   Resp: 18  16   Temp:    98 F (36.7 C)  TempSrc:    Oral  SpO2: 100%  100%       Constitutional: NAD, calm, comfortable-overall pale appearance Eyes: PERRL, lids normal, conjunctiva pale almost white ENMT: Mucous membranes are moist. Posterior pharynx clear of any exudate or lesions.Normal dentition.  Neck: normal, supple, no masses, no thyromegaly Respiratory: clear to auscultation bilaterally, no wheezing, no crackles. Normal respiratory effort. No accessory muscle use.  Cardiovascular: Regular rate and rhythm, no murmurs / rubs / gallops. No extremity edema. 2+ pedal pulses. No carotid bruits.  Abdomen: no tenderness, no masses palpated. No hepatosplenomegaly. Bowel sounds positive.  Musculoskeletal: no clubbing / cyanosis. No joint  deformity upper and lower extremities. Good ROM, no contractures. Normal muscle tone.  Skin: no rashes, lesions, ulcers. No induration Neurologic: CN 2-12 grossly intact. Sensation intact, DTR normal. Strength 5/5 x all 4 extremities.  Psychiatric: Normal judgment and insight. Alert and oriented x 3. Normal mood.    Labs on Admission: I have personally reviewed following labs and imaging studies  CBC: Recent Labs  Lab 01/23/18 1151 01/25/18 0855  WBC 10.3 11.5*  NEUTROABS 8.1*  --   HGB 4.3* 4.2*  HCT 16.9* 16.8*  MCV 70* 72.4*  PLT 215 224   Basic Metabolic Panel: Recent Labs  Lab 01/23/18 1151 01/25/18 0855  NA 135 137  K 4.1 3.6  CL 104 106  CO2 15* 21*  GLUCOSE 100* 98  BUN 13 16  CREATININE 0.96 0.70  CALCIUM 8.7 9.2   GFR: Estimated Creatinine Clearance: 86.2 mL/min (by C-G formula based on SCr of 0.7 mg/dL). Liver Function Tests: Recent Labs  Lab 01/23/18 1151 01/25/18 0855  AST 10 12*  ALT 5 8*  ALKPHOS 85 72  BILITOT 0.3 0.2*  PROT 7.8 7.5  ALBUMIN 4.3 3.6   No results for input(s): LIPASE, AMYLASE in the last 168 hours. No results for input(s): AMMONIA in the last 168 hours. Coagulation Profile: No results for input(s): INR, PROTIME in the last 168 hours. Cardiac Enzymes: No results for input(s): CKTOTAL, CKMB, CKMBINDEX, TROPONINI in the last 168 hours. BNP (last 3 results) No results for input(s): PROBNP in the last 8760 hours. HbA1C: No results for input(s): HGBA1C in the last 72 hours. CBG: No results for input(s): GLUCAP in the last 168 hours. Lipid Profile: Recent Labs    01/23/18 1151  CHOL 87*  HDL 54  LDLCALC 23  TRIG 52  CHOLHDL 1.6   Thyroid Function Tests: Recent Labs    01/23/18 1151  TSH 0.987   Anemia Panel: No results for input(s): VITAMINB12, FOLATE, FERRITIN, TIBC, IRON, RETICCTPCT in the last 72 hours. Urine analysis:    Component Value Date/Time   COLORURINE YELLOW 05/21/2013 2015   APPEARANCEUR CLEAR  05/21/2013 2015   LABSPEC 1.019 05/21/2013 2015   PHURINE  5.5 05/21/2013 2015   GLUCOSEU NEGATIVE 05/21/2013 2015   HGBUR SMALL (A) 05/21/2013 2015   BILIRUBINUR negative 01/23/2018 1140   KETONESUR negative 01/23/2018 1140   KETONESUR 15 (A) 05/21/2013 2015   PROTEINUR =30 (A) 01/23/2018 1140   PROTEINUR 30 (A) 05/21/2013 2015   UROBILINOGEN 0.2 01/23/2018 1140   UROBILINOGEN 0.2 05/21/2013 2015   NITRITE Negative 01/23/2018 1140   NITRITE NEGATIVE 05/21/2013 2015   LEUKOCYTESUR Negative 01/23/2018 1140   Sepsis Labs: _0 (procalcitonin:4,lacticidven:4) )No results found for this or any previous visit (from the past 240 hour(s)).   Radiological Exams on Admission: Dg Lumbar Spine Complete  Result Date: 01/23/2018 CLINICAL DATA:  Low back pain with radicular symptoms. EXAM: LUMBAR SPINE - COMPLETE 4+ VIEW COMPARISON:  CT abdomen pelvis dated September 10, 2004. FINDINGS: Five lumbar type vertebral bodies. No acute fracture or subluxation. Vertebral body heights are preserved. Alignment is normal. Moderate to severe disc height loss at L5-S1. Remaining intervertebral disc spaces are maintained. Mild bilateral facet arthropathy at L4-L5. No pars defects. IMPRESSION: Moderate to severe degenerative disc disease at L5-S1. Electronically Signed   By: Titus Dubin M.D.   On: 01/23/2018 12:30   Dg Shoulder Right  Result Date: 01/23/2018 CLINICAL DATA:  Right shoulder pain. EXAM: RIGHT SHOULDER - 2+ VIEW COMPARISON:  None. FINDINGS: No acute fracture or dislocation. Severe glenohumeral joint space narrowing with prominent inferior osteophytes. Mild acromioclavicular joint space narrowing. Soft tissues are unremarkable. IMPRESSION: Severe glenohumeral osteoarthritis.  No acute osseous abnormality. Electronically Signed   By: Titus Dubin M.D.   On: 01/23/2018 12:32    Assessment/Plan Principal Problem:   Symptomatic anemia  -Patient presents with several weeks of generalized feeling  of malaise; routine labs at PCP revealed significant anemia hemoglobin 4.3-currently normotensive with FOB negative -For completeness of evaluation continue to follow FOB -Denies issues with recurrence of heavy menses and no dark, black or bloody stools or emesis -Anemia panel pending -TSH from 2/18 within normal limits -Recent low back pain and was prescribed Mobic and prednisone taper on 2/18 but it is unlikely these have contributed to patient's anemia since the hemoglobin was obtained on date these were prescribed and she has no symptoms consistent with GI bleeding -History in 2013 of similar presentation with anemia in context of heavy menses-apparently has undergone treatment for this and now no longer has heavy periods -Given history of gastric bypass procedure and underlying iron deficiency anemia suspect patient may have malabsorptive disorder contributing to her anemia -Follow-up on anemia panel -May require outpatient referral to hematologist -EDP has ordered 2 units PRBCs-repeat CBC after transfusions complete but anticipate will need a minimum of 2 more units of blood-repeat CBC in a.m. as well -Orthostatic vital signs to ensure BP not dropping with activity -Hold prednisone taper for now-patient took today's a.m. dose prior to arrival; hold Mobic-patient counseled in future if required short-term NSAIDs recommend OTC PPI with magnesium **outpatient x-ray 2/18 of lumbar spine revealed moderate to severe degenerative disc disease at L5-S1  Active Problems:   Hypertension -Current resting blood pressure around 110 and given symptomatic anemia we will hold preadmission Norvasc as well as lisinopril/HCTZ    Anxiety disorder -Continue preadmission Xanax and Prozac    History of gastric bypass -Successful weight loss -Continue multiple vitamin  -On oral B12 prior to admission    Fe deficiency anemia -Continue oral iron replacement-patient reports significant constipation with this  medication but in review of outpatient meds does not appear to be  taking regular stool softeners and/or laxatives -Follow-up on anemia panel-may require IV iron prior to discharge    Chronic right shoulder pain -Evaluated by PCP on 2/18 with plain film revealing severe glenohumeral osteoarthritis    Hx of seizure disorder -Patient had solitary episode of seizure disorder in 2014 felt to be multifactorial related to concomitant utilization of Wellbutrin and Ultram as well as complex migraine and increased positive stressors in relation to child's graduation from high school -Was on AEDs times 1 year-no longer takes -No recurrent seizure activity -Had negative sleep deprived EEG    Chronic headache -No worse than baseline in context of recent anemia    **Additional lab, imaging and/or diagnostic evaluation at discretion of supervising physician  DVT prophylaxis: SCDs Code Status: Full Family Communication: No family at bedside Disposition Plan: Home Consults called: None    Garyn Waguespack L. ANP-BC [facility] Pager [phone]   If 7PM-7AM, please contact night-coverage [email or web address] Password [facility]  01/25/2018, 10:59 AM

## 2018-01-25 NOTE — Progress Notes (Signed)
Patient arrived to 6N06 from ED, A&Ox4, VSS, IV intact and infusing RBC.  Patient is pale but otherwise asymptomatic from critically low Hgb.  No family at bedside.  Skin is intact.  Patient oriented to room and equipment.  Will continue to monitor.

## 2018-01-26 DIAGNOSIS — D649 Anemia, unspecified: Secondary | ICD-10-CM | POA: Diagnosis not present

## 2018-01-26 LAB — CBC
HCT: 27 % — ABNORMAL LOW (ref 36.0–46.0)
Hemoglobin: 8 g/dL — ABNORMAL LOW (ref 12.0–15.0)
MCH: 22.7 pg — AB (ref 26.0–34.0)
MCHC: 29.6 g/dL — AB (ref 30.0–36.0)
MCV: 76.5 fL — ABNORMAL LOW (ref 78.0–100.0)
Platelets: 219 10*3/uL (ref 150–400)
RBC: 3.53 MIL/uL — ABNORMAL LOW (ref 3.87–5.11)
RDW: 23.7 % — AB (ref 11.5–15.5)
WBC: 7.3 10*3/uL (ref 4.0–10.5)

## 2018-01-26 LAB — PREPARE RBC (CROSSMATCH)

## 2018-01-26 LAB — PATHOLOGIST SMEAR REVIEW

## 2018-01-26 LAB — HIV ANTIBODY (ROUTINE TESTING W REFLEX): HIV SCREEN 4TH GENERATION: NONREACTIVE

## 2018-01-26 MED ORDER — FERROUS GLUCONATE 324 (38 FE) MG PO TABS: 324.0000 mg | ORAL_TABLET | Freq: Three times a day (TID) | ORAL | 3 refills | Status: DC

## 2018-01-26 MED ORDER — FOLIC ACID 1 MG PO TABS
1.0000 mg | ORAL_TABLET | Freq: Every day | ORAL | Status: DC
  Administered 2018-01-26: 1 mg via ORAL
  Filled 2018-01-26: qty 1

## 2018-01-26 MED ORDER — CYANOCOBALAMIN 1000 MCG/ML IJ SOLN
1000.0000 ug | Freq: Once | INTRAMUSCULAR | Status: AC
  Administered 2018-01-26: 1000 ug via INTRAMUSCULAR
  Filled 2018-01-26: qty 1

## 2018-01-26 MED ORDER — FOLIC ACID 1 MG PO TABS: 1.0000 mg | ORAL_TABLET | Freq: Every day | ORAL | 0 refills | Status: DC

## 2018-01-26 MED ORDER — SODIUM CHLORIDE 0.9 % IV SOLN
Freq: Once | INTRAVENOUS | Status: AC
  Administered 2018-01-26: 04:00:00 via INTRAVENOUS

## 2018-01-26 MED ORDER — SODIUM CHLORIDE 0.9 % IV SOLN
510.0000 mg | Freq: Once | INTRAVENOUS | Status: AC
  Administered 2018-01-26: 510 mg via INTRAVENOUS
  Filled 2018-01-26: qty 17

## 2018-01-26 NOTE — Discharge Summary (Signed)
Physician Discharge Summary  Tammy [name] ZOX:[id number] DOB: 1976-11-07 DOA: 01/25/2018  PCP: Ethelda Chick, MD  Admit date: 01/25/2018 Discharge date: 01/26/2018  Time spent: 45 minutes  Recommendations for Outpatient Follow-up:  1. Needs Weekly or every other week Feraheme-IV Iron infusions until Iron stores build up, suspect minimal absorption of PO iron post Bypass surgery 2. Give Vitamin B12 shots weekly for 4 weeks and then monthly for 6months 3. Continue Folic acid daily 4. PCP in 1 week, please monitor Hb and Iron studies, B12 and Folate periodically, recommend nonurgent GI follow-up in one month   Discharge Diagnoses:  Principal Problem:   Severe Iron Deficiency anemia   Hypertension   Hx of seizure disorder   Anxiety disorder   History of gastric bypass   Fe deficiency anemia   Chronic headache   Chronic right shoulder pain   Discharge Condition: Stable  Diet recommendation: Regular  There were no vitals filed for this visit.  History of present illness:  Tammy [name] is a 42 y.o. female with a Past Medical History of HTN and iron deficiency anemia requiring transfusions (previously thought to be related to DUB now s/p endometrial ablation with only rare spotting) who presents with recurrent symptomatic anemia  Hospital Course:  Severe chronic iron deficiency anemia -Likely multifactorial from prior DUB/ history of endometrial ablation with the infrequent menstrual cycles now -Also history of remote gastric bypass surgery hence likely decreased iron absorption as well, along with folate and mild B-12 deficiency -She was given 3 units of PRBC, IV iron and IM B-12 shot -She has a PCP and will need IV iron infusions likely biweekly, if this cannot be managed she will need referral to Heme clinic -She was also started on oral iron and B-12 at discharge -Suggest gastroenterology evaluation in one month just to be certain there is no element of blood loss  although appears less likely  Discharge Exam: Vitals:   01/26/18 0611 01/26/18 0744  BP: 120/77 120/74  Pulse: 68 75  Resp: 16 14  Temp: 97.7 F (36.5 C) 98.7 F (37.1 C)  SpO2: 100% 100%    General: AAO 3 Cardiovascular: S1-S2/regular rate rhythm Respiratory: Clear bilaterally  Discharge Instructions   Discharge Instructions    Diet - low sodium heart healthy   Complete by:  As directed    Increase activity slowly   Complete by:  As directed      Allergies as of 01/26/2018      Reactions   Nsaids Other (See Comments)   Had gastric bypass sx- Pt has a sensitivity to drug Pt states that she can take NSAIDS but should use them in moderation      Medication List    STOP taking these medications   ferrous sulfate 325 (65 FE) MG tablet   meloxicam 15 MG tablet Commonly known as:  MOBIC   predniSONE 20 MG tablet Commonly known as:  DELTASONE     TAKE these medications   acetaminophen 500 MG tablet Commonly known as:  TYLENOL Take 1,000 mg by mouth every 6 (six) hours as needed for headache.   ALPRAZolam 0.5 MG tablet Commonly known as:  XANAX 1 AT MIDDAY AND 2 TABLETS AT BEDTIME   amLODipine 5 MG tablet Commonly known as:  NORVASC Take 1 tablet (5 mg total) by mouth daily.   CALCIUM 1200 PO Take 1 tablet by mouth at bedtime.   cyclobenzaprine 10 MG tablet Commonly known as:  FLEXERIL TAKE 1  TABLET BY MOUTH AT BEDTIME. What changed:    how much to take  how to take this  when to take this  reasons to take this  additional instructions   ferrous gluconate 324 MG tablet Commonly known as:  FERGON Take 1 tablet (324 mg total) by mouth 3 (three) times daily with meals.   FLUoxetine 40 MG capsule Commonly known as:  PROZAC Take 1 capsule (40 mg total) by mouth daily.   folic acid 1 MG tablet Commonly known as:  FOLVITE Take 1 tablet (1 mg total) by mouth daily.   lisinopril-hydrochlorothiazide 10-12.5 MG tablet Commonly known as:   PRINZIDE,ZESTORETIC TAKE 1 TABLET BY MOUTH DAILY   multivitamin tablet Take 1 tablet by mouth daily.   VITAMIN B 12 PO Take 1 tablet by mouth daily.      Allergies  Allergen Reactions  . Nsaids Other (See Comments)    Had gastric bypass sx- Pt has a sensitivity to drug Pt states that she can take NSAIDS but should use them in moderation   Follow-up Information    Ethelda ChickSmith, Kristi M, MD. Schedule an appointment as soon as possible for a visit in 1 week(s).   Specialty:  Family Medicine Why:  ask for IV Iron infusions at next visit Contact information: [address] [place] [address] [phone]            The results of significant diagnostics from this hospitalization (including imaging, microbiology, ancillary and laboratory) are listed below for reference.    Significant Diagnostic Studies: Dg Lumbar Spine Complete  Result Date: 01/23/2018 CLINICAL DATA:  Low back pain with radicular symptoms. EXAM: LUMBAR SPINE - COMPLETE 4+ VIEW COMPARISON:  CT abdomen pelvis dated September 10, 2004. FINDINGS: Five lumbar type vertebral bodies. No acute fracture or subluxation. Vertebral body heights are preserved. Alignment is normal. Moderate to severe disc height loss at L5-S1. Remaining intervertebral disc spaces are maintained. Mild bilateral facet arthropathy at L4-L5. No pars defects. IMPRESSION: Moderate to severe degenerative disc disease at L5-S1. Electronically Signed   By: Obie DredgeWilliam T Derry M.D.   On: 01/23/2018 12:30   Dg Shoulder Right  Result Date: 01/23/2018 CLINICAL DATA:  Right shoulder pain. EXAM: RIGHT SHOULDER - 2+ VIEW COMPARISON:  None. FINDINGS: No acute fracture or dislocation. Severe glenohumeral joint space narrowing with prominent inferior osteophytes. Mild acromioclavicular joint space narrowing. Soft tissues are unremarkable. IMPRESSION: Severe glenohumeral osteoarthritis.  No acute osseous abnormality. Electronically Signed   By: Obie DredgeWilliam T Derry M.D.   On:  01/23/2018 12:32    Microbiology: No results found for this or any previous visit (from the past 240 hour(s)).   Labs: Basic Metabolic Panel: Recent Labs  Lab 01/23/18 1151 01/25/18 0855  NA 135 137  K 4.1 3.6  CL 104 106  CO2 15* 21*  GLUCOSE 100* 98  BUN 13 16  CREATININE 0.96 0.70  CALCIUM 8.7 9.2   Liver Function Tests: Recent Labs  Lab 01/23/18 1151 01/25/18 0855  AST 10 12*  ALT 5 8*  ALKPHOS 85 72  BILITOT 0.3 0.2*  PROT 7.8 7.5  ALBUMIN 4.3 3.6   No results for input(s): LIPASE, AMYLASE in the last 168 hours. No results for input(s): AMMONIA in the last 168 hours. CBC: Recent Labs  Lab 01/23/18 1151 01/25/18 0855 01/25/18 2013 01/26/18 0753  WBC 10.3 11.5* 10.4 7.3  NEUTROABS 8.1*  --   --   --   HGB 4.3* 4.2* 6.6* 8.0*  HCT 16.9* 16.8*  23.1* 27.0*  MCV 70* 72.4* 74.3* 76.5*  PLT 215 237 238 219   Cardiac Enzymes: No results for input(s): CKTOTAL, CKMB, CKMBINDEX, TROPONINI in the last 168 hours. BNP: BNP (last 3 results) No results for input(s): BNP in the last 8760 hours.  ProBNP (last 3 results) No results for input(s): PROBNP in the last 8760 hours.  CBG: No results for input(s): GLUCAP in the last 168 hours.     Signed:  Zannie Cove MD.  [facility] 01/26/2018, 10:31 AM

## 2018-01-26 NOTE — Progress Notes (Signed)
Pt is discharged to home.  Discharge instructions and prescriptions given.

## 2018-01-27 ENCOUNTER — Telehealth: Payer: Self-pay

## 2018-01-27 LAB — TYPE AND SCREEN
ABO/RH(D): B POS
Antibody Screen: NEGATIVE
UNIT DIVISION: 0
UNIT DIVISION: 0
Unit division: 0

## 2018-01-27 LAB — BPAM RBC
BLOOD PRODUCT EXPIRATION DATE: 201903152359
BLOOD PRODUCT EXPIRATION DATE: 201903182359
Blood Product Expiration Date: 201903192359
ISSUE DATE / TIME: 201902201502
ISSUE DATE / TIME: 201902201042
ISSUE DATE / TIME: 201902210331
UNIT TYPE AND RH: 7300
Unit Type and Rh: 7300
Unit Type and Rh: 7300

## 2018-01-27 NOTE — Telephone Encounter (Signed)
Transition Care Management Follow-up Telephone Call   Date discharged? 01/26/18   How have you been since you were released from the hospital? Patient states that she is feeling better. Still tired and achy.    Do you understand why you were in the hospital? yes   Do you understand the discharge instructions? yes   Where were you discharged to? home   Items Reviewed:  Medications reviewed: yes  Allergies reviewed: yes  Dietary changes reviewed: yes  Referrals reviewed: yes   Functional Questionnaire:   Activities of Daily Living (ADLs):   She states they are independent in the following: ambulation, bathing and hygiene, feeding, continence, grooming, toileting and dressing States they require assistance with the following: none   Any transportation issues/concerns?: no   Any patient concerns? no   Confirmed importance and date/time of follow-up visits scheduled yes  Provider Appointment booked with Dr. Katrinka BlazingSmith on 01/30/18 @ 2:40 pm.   Confirmed with patient if condition begins to worsen call PCP or go to the ER.  Patient was given the office number and encouraged to call back with question or concerns.  : yes

## 2018-01-28 LAB — COPPER, SERUM: Copper: 86 ug/dL (ref 72–166)

## 2018-01-30 ENCOUNTER — Ambulatory Visit: Payer: Self-pay | Admitting: Family Medicine

## 2018-01-30 LAB — METHYLMALONIC ACID, SERUM: Methylmalonic Acid, Quantitative: 295 nmol/L (ref 0–378)

## 2018-02-01 ENCOUNTER — Telehealth: Payer: Self-pay | Admitting: Hematology and Oncology

## 2018-02-01 ENCOUNTER — Other Ambulatory Visit: Payer: Self-pay

## 2018-02-01 ENCOUNTER — Ambulatory Visit: Payer: Federal, State, Local not specified - PPO | Admitting: Family Medicine

## 2018-02-01 ENCOUNTER — Encounter: Payer: Self-pay | Admitting: Hematology and Oncology

## 2018-02-01 ENCOUNTER — Encounter: Payer: Self-pay | Admitting: Family Medicine

## 2018-02-01 VITALS — BP 104/70 | HR 63 | Temp 98.0°F | Resp 16 | Ht 66.0 in | Wt 147.0 lb

## 2018-02-01 DIAGNOSIS — D508 Other iron deficiency anemias: Secondary | ICD-10-CM | POA: Diagnosis not present

## 2018-02-01 DIAGNOSIS — F419 Anxiety disorder, unspecified: Secondary | ICD-10-CM | POA: Diagnosis not present

## 2018-02-01 DIAGNOSIS — Z8669 Personal history of other diseases of the nervous system and sense organs: Secondary | ICD-10-CM

## 2018-02-01 DIAGNOSIS — E538 Deficiency of other specified B group vitamins: Secondary | ICD-10-CM

## 2018-02-01 DIAGNOSIS — I10 Essential (primary) hypertension: Secondary | ICD-10-CM | POA: Diagnosis not present

## 2018-02-01 DIAGNOSIS — F13230 Sedative, hypnotic or anxiolytic dependence with withdrawal, uncomplicated: Secondary | ICD-10-CM | POA: Diagnosis not present

## 2018-02-01 DIAGNOSIS — F13930 Sedative, hypnotic or anxiolytic use, unspecified with withdrawal, uncomplicated: Secondary | ICD-10-CM

## 2018-02-01 DIAGNOSIS — Z9884 Bariatric surgery status: Secondary | ICD-10-CM

## 2018-02-01 MED ORDER — ALPRAZOLAM 0.5 MG PO TABS: ORAL_TABLET | ORAL | 0 refills | Status: DC

## 2018-02-01 MED ORDER — CYANOCOBALAMIN 1000 MCG/ML IJ SOLN
1000.0000 ug | INTRAMUSCULAR | Status: DC
  Administered 2018-02-01 – 2018-03-28 (×3): 1000 ug via INTRAMUSCULAR
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
## 2018-02-01 NOTE — Patient Instructions (Signed)
     IF you received an x-ray today, you will receive an invoice from [facility]. Please contact [facility] at [phone] with questions or concerns regarding your invoice.   IF you received labwork today, you will receive an invoice from [facility]. Please contact [facility] at [phone] with questions or concerns regarding your invoice.   Our billing staff will not be able to assist you with questions regarding bills from these companies.  You will be contacted with the lab results as soon as they are available. The fastest way to get your results is to activate your My Chart account. Instructions are located on the last page of this paperwork. If you have not heard from us regarding the results in 2 weeks, please contact this office.     

## 2018-02-01 NOTE — Telephone Encounter (Signed)
Appt has been scheduled for the pt to see Dr. Gweneth DimitriPerlov on 3/13 at aware to arrive  30 minutes early. Letter mailed.

## 2018-02-02 LAB — CBC WITH DIFFERENTIAL/PLATELET
Basophils Absolute: 0 10*3/uL (ref 0.0–0.2)
Basos: 0 %
EOS (ABSOLUTE): 0 10*3/uL (ref 0.0–0.4)
EOS: 0 %
HEMATOCRIT: 33.9 % — AB (ref 34.0–46.6)
HEMOGLOBIN: 10.1 g/dL — AB (ref 11.1–15.9)
Immature Grans (Abs): 0.1 10*3/uL (ref 0.0–0.1)
Immature Granulocytes: 1 %
LYMPHS ABS: 0.6 10*3/uL — AB (ref 0.7–3.1)
Lymphs: 3 %
MCH: 23.5 pg — AB (ref 26.6–33.0)
MCHC: 29.8 g/dL — AB (ref 31.5–35.7)
MCV: 79 fL (ref 79–97)
MONOCYTES: 2 %
MONOS ABS: 0.3 10*3/uL (ref 0.1–0.9)
NEUTROS ABS: 18.9 10*3/uL — AB (ref 1.4–7.0)
Neutrophils: 94 %
Platelets: 539 10*3/uL — ABNORMAL HIGH (ref 150–379)
RBC: 4.3 x10E6/uL (ref 3.77–5.28)
RDW: 29 % — ABNORMAL HIGH (ref 12.3–15.4)
WBC: 19.9 10*3/uL — AB (ref 3.4–10.8)

## 2018-02-02 LAB — COMPREHENSIVE METABOLIC PANEL
A/G RATIO: 1.2 (ref 1.2–2.2)
ALBUMIN: 4.2 g/dL (ref 3.5–5.5)
ALK PHOS: 86 IU/L (ref 39–117)
ALT: 10 IU/L (ref 0–32)
AST: 9 IU/L (ref 0–40)
BILIRUBIN TOTAL: 0.4 mg/dL (ref 0.0–1.2)
BUN / CREAT RATIO: 25 — AB (ref 9–23)
BUN: 19 mg/dL (ref 6–24)
CHLORIDE: 104 mmol/L (ref 96–106)
CO2: 18 mmol/L — ABNORMAL LOW (ref 20–29)
Calcium: 9.2 mg/dL (ref 8.7–10.2)
Creatinine, Ser: 0.75 mg/dL (ref 0.57–1.00)
GFR calc Af Amer: 114 mL/min/{1.73_m2} (ref >59)
GFR calc non Af Amer: 99 mL/min/{1.73_m2} (ref >59)
GLOBULIN, TOTAL: 3.5 g/dL (ref 1.5–4.5)
Glucose: 93 mg/dL (ref 65–99)
Potassium: 5 mmol/L (ref 3.5–5.2)
SODIUM: 137 mmol/L (ref 134–144)
TOTAL PROTEIN: 7.7 g/dL (ref 6.0–8.5)

## 2018-02-07 ENCOUNTER — Other Ambulatory Visit: Payer: Self-pay | Admitting: Family Medicine

## 2018-02-07 DIAGNOSIS — E538 Deficiency of other specified B group vitamins: Secondary | ICD-10-CM

## 2018-02-07 DIAGNOSIS — F419 Anxiety disorder, unspecified: Secondary | ICD-10-CM

## 2018-02-07 MED ORDER — CYANOCOBALAMIN 1000 MCG/ML IJ SOLN: 1000.0000 ug | INTRAMUSCULAR | Status: AC

## 2018-02-10 ENCOUNTER — Other Ambulatory Visit: Payer: Self-pay | Admitting: Family Medicine

## 2018-02-10 DIAGNOSIS — M519 Unspecified thoracic, thoracolumbar and lumbosacral intervertebral disc disorder: Secondary | ICD-10-CM

## 2018-02-10 NOTE — Telephone Encounter (Signed)
meloxicam LOV: 02/01/18 Last refill: 01/09/18 [facility] [address] [place]  Medication not on pt med list

## 2018-02-15 ENCOUNTER — Telehealth: Payer: Self-pay | Admitting: Hematology and Oncology

## 2018-02-15 ENCOUNTER — Inpatient Hospital Stay: Payer: Federal, State, Local not specified - PPO

## 2018-02-15 ENCOUNTER — Inpatient Hospital Stay
Payer: Federal, State, Local not specified - PPO | Attending: Hematology and Oncology | Admitting: Hematology and Oncology

## 2018-02-15 ENCOUNTER — Encounter: Payer: Self-pay | Admitting: Hematology and Oncology

## 2018-02-15 VITALS — BP 147/98 | HR 72 | Temp 99.3°F | Resp 18 | Ht 66.0 in | Wt 155.8 lb

## 2018-02-15 DIAGNOSIS — E538 Deficiency of other specified B group vitamins: Secondary | ICD-10-CM | POA: Insufficient documentation

## 2018-02-15 DIAGNOSIS — Z9884 Bariatric surgery status: Secondary | ICD-10-CM | POA: Diagnosis not present

## 2018-02-15 DIAGNOSIS — D509 Iron deficiency anemia, unspecified: Secondary | ICD-10-CM

## 2018-02-15 DIAGNOSIS — D508 Other iron deficiency anemias: Secondary | ICD-10-CM

## 2018-02-15 DIAGNOSIS — Z79899 Other long term (current) drug therapy: Secondary | ICD-10-CM

## 2018-02-15 DIAGNOSIS — D513 Other dietary vitamin B12 deficiency anemia: Secondary | ICD-10-CM

## 2018-02-15 LAB — CBC WITH DIFFERENTIAL (CANCER CENTER ONLY)
BASOS ABS: 0.1 10*3/uL (ref 0.0–0.1)
BASOS PCT: 1 %
Eosinophils Absolute: 0.2 10*3/uL (ref 0.0–0.5)
Eosinophils Relative: 3 %
HEMATOCRIT: 34 % — AB (ref 34.8–46.6)
HEMOGLOBIN: 10.6 g/dL — AB (ref 11.6–15.9)
Lymphocytes Relative: 12 %
Lymphs Abs: 0.8 10*3/uL — ABNORMAL LOW (ref 0.9–3.3)
MCH: 25.4 pg (ref 25.1–34.0)
MCHC: 31.2 g/dL — ABNORMAL LOW (ref 31.5–36.0)
MCV: 81.4 fL (ref 79.5–101.0)
Monocytes Absolute: 0.5 10*3/uL (ref 0.1–0.9)
Monocytes Relative: 8 %
NEUTROS ABS: 5.4 10*3/uL (ref 1.5–6.5)
NEUTROS PCT: 76 %
Platelet Count: 498 10*3/uL — ABNORMAL HIGH (ref 145–400)
RBC: 4.18 MIL/uL (ref 3.70–5.45)
RDW: 29.5 % — AB (ref 11.2–14.5)
WBC: 7 10*3/uL (ref 3.9–10.3)

## 2018-02-15 LAB — CMP (CANCER CENTER ONLY)
ALBUMIN: 3.4 g/dL — AB (ref 3.5–5.0)
ALK PHOS: 83 U/L (ref 40–150)
ALT: 16 U/L (ref 0–55)
AST: 16 U/L (ref 5–34)
Anion gap: 6 (ref 3–11)
BILIRUBIN TOTAL: 0.6 mg/dL (ref 0.2–1.2)
BUN: 14 mg/dL (ref 7–26)
CO2: 24 mmol/L (ref 22–29)
Calcium: 9.3 mg/dL (ref 8.4–10.4)
Chloride: 108 mmol/L (ref 98–109)
Creatinine: 0.71 mg/dL (ref 0.60–1.10)
GFR, Est AFR Am: >60 mL/min (ref >60)
GFR, Estimated: >60 mL/min (ref >60)
GLUCOSE: 89 mg/dL (ref 70–140)
POTASSIUM: 4.4 mmol/L (ref 3.5–5.1)
Sodium: 138 mmol/L (ref 136–145)
TOTAL PROTEIN: 7.1 g/dL (ref 6.4–8.3)

## 2018-02-15 NOTE — Telephone Encounter (Signed)
Scheduled appt per 3/13 los - Gave patient AVS and calender per los.  

## 2018-02-16 LAB — IRON AND TIBC
Iron: 26 ug/dL — ABNORMAL LOW (ref 41–142)
Saturation Ratios: 7 % — ABNORMAL LOW (ref 21–57)
TIBC: 365 ug/dL (ref 236–444)
UIBC: 339 ug/dL

## 2018-02-16 LAB — FERRITIN: Ferritin: 38 ng/mL (ref 9–269)

## 2018-02-24 ENCOUNTER — Inpatient Hospital Stay: Payer: Federal, State, Local not specified - PPO

## 2018-02-24 ENCOUNTER — Other Ambulatory Visit: Payer: Self-pay | Admitting: Hematology and Oncology

## 2018-02-24 VITALS — BP 126/88 | HR 80 | Temp 98.3°F | Resp 16

## 2018-02-24 DIAGNOSIS — Z79899 Other long term (current) drug therapy: Secondary | ICD-10-CM | POA: Diagnosis not present

## 2018-02-24 DIAGNOSIS — E538 Deficiency of other specified B group vitamins: Secondary | ICD-10-CM | POA: Diagnosis not present

## 2018-02-24 DIAGNOSIS — D508 Other iron deficiency anemias: Secondary | ICD-10-CM

## 2018-02-24 DIAGNOSIS — D509 Iron deficiency anemia, unspecified: Secondary | ICD-10-CM | POA: Diagnosis not present

## 2018-02-24 DIAGNOSIS — Z9884 Bariatric surgery status: Secondary | ICD-10-CM | POA: Diagnosis not present

## 2018-02-24 MED ORDER — SODIUM CHLORIDE 0.9 % IV SOLN
Freq: Once | INTRAVENOUS | Status: AC
  Administered 2018-02-24: 09:00:00 via INTRAVENOUS

## 2018-02-24 MED ORDER — FERUMOXYTOL INJECTION 510 MG/17 ML
510.0000 mg | Freq: Once | INTRAVENOUS | Status: AC
  Administered 2018-02-24: 510 mg via INTRAVENOUS
  Filled 2018-02-24: qty 17

## 2018-02-24 NOTE — Patient Instructions (Signed)

## 2018-02-26 NOTE — Assessment & Plan Note (Signed)
42 y.o. iron depletion resulting in severe female with profound hypochromic and microcytic anemia with nadir hemoglobin of 4.5 measured on 01/25/18.  In addition, patient was found to have B12 deficiency with elevated MMA.  Etiology of the depletion is likely impaired absorption of iron B12 due to previous history of gastric bypass surgery.  Plan: -Obtain additional lab work today as outlined below. -Proceed with parenteral iron replacement with Feraheme 510 mg IV x2. -Return to clinic in 6 weeks with labs obtained 3 days prior to assess level of iron replacement.

## 2018-02-26 NOTE — Progress Notes (Signed)
[facility] New Visit:  Assessment: Fe deficiency anemia 42 y.o. iron depletion resulting in severe female with profound hypochromic and microcytic anemia with nadir hemoglobin of 4.5 measured on 01/25/18.  In addition, patient was found to have B12 deficiency with elevated MMA.  Etiology of the depletion is likely impaired absorption of iron B12 due to previous history of gastric bypass surgery.  Plan: -Obtain additional lab work today as outlined below. -Proceed with parenteral iron replacement with Feraheme 510 mg IV x2. -Return to clinic in 6 weeks with labs obtained 3 days prior to assess level of iron replacement.   Voice recognition software was used and creation of this note. Despite my best effort at editing the text, some misspelling/errors may have occurred. Orders Placed This Encounter  Procedures  . CBC with Differential (Cancer Center Only)    Standing Status:   Future    Number of Occurrences:   1    Standing Expiration Date:   02/16/2019  . CMP ([facility] only)    Standing Status:   Future    Number of Occurrences:   1    Standing Expiration Date:   02/16/2019  . Iron and TIBC    Standing Status:   Future    Number of Occurrences:   1    Standing Expiration Date:   02/16/2019  . Ferritin    Standing Status:   Future    Number of Occurrences:   1    Standing Expiration Date:   02/16/2019  . CBC with Differential (Cancer Center Only)    Standing Status:   Future    Standing Expiration Date:   02/16/2019  . CMP ([facility] only)    Standing Status:   Future    Standing Expiration Date:   02/16/2019  . Lactate dehydrogenase (LDH)    Standing Status:   Future    Standing Expiration Date:   02/15/2019  . Iron and TIBC    Standing Status:   Future    Standing Expiration Date:   02/16/2019  . Ferritin    Standing Status:   Future    Standing Expiration Date:   02/16/2019  . Folate, Serum    Standing Status:   Future    Standing Expiration  Date:   02/15/2019  . Vitamin B12    Standing Status:   Future    Standing Expiration Date:   02/15/2019  . Methylmalonic acid, serum    Standing Status:   Future    Standing Expiration Date:   02/15/2019    All questions were answered.  . The patient knows to call the clinic with any problems, questions or concerns.  This note was electronically signed.    History of Presenting Illness Tammy [name] 42 y.o. presenting to the [facility] for severe anemia due to iron deficiency and B12 deficiency in context of previous history of gastric bypass surgery, referred by Dr Steffanie Dunn [place].  Patient's past medical history significant for hypertension, history of kidney disorder, anxiety, and history of gastric bypass.  Patient has been receiving ferrous gluconate 324 mg 3 times a day and reports some compliance with the medication.  Denies any active alcohol use, no smoking.  At the present time, her only complaint is profound fatigue, otherwise she denies any new complaints.  Patient has not had any recent follow-up with bariatric surgery or gastroenterology.  Oncological/hematological History: --Treatment:  --Transfusion, Sep 2013: pRBC x3 units --Labs, 08/06/13: Hgb  14.7, MCV 96.5, MCH 30.9, MCHC 32.1, RDW     ..., Plt    ...;  --Labs, 09/24/15: Hgb   8.3, MCV 76.7, MCH 22.0, MCHC 28.7, RDW 25.5, Plt 485; --Labs, 01/25/18: Hgb   4.5, MCV 72.4, MCH 18.1, MCHC 25.0, RDW 29.3, Plt 237; Fe 6, FeSat 1%, TIBC 517, Ferritin 4, Folate 5.8, Vit B12 341; TSH 0.99; FOBT -- negative  --Transfusion, 01/25/18: pRBC x3 units --Labs, 01/26/18: Hgb   8.0, MCV 76.5, MCH 22.7, MCHC 29.6, RDW 23.7, Plt 219; Cu 86, MMA 295 --Labs, 02/01/18: Hgb 10.1, MCV 79.0, MCH 23.5, MCHC 29.8, RDW 29.0, Plt 539;    Medical History: Past Medical History:  Diagnosis Date  . Allergy   . Anxiety   . Blood transfusion without reported diagnosis   . Chronic right shoulder pain   . Fe deficiency anemia   . History of  seizure disorder   . Hypertension   . Migraines   . SVD (spontaneous vaginal delivery)    x 5    Surgical History: Past Surgical History:  Procedure Laterality Date  . DILITATION & CURRETTAGE/HYSTROSCOPY WITH HYDROTHERMAL ABLATION  10/11/2012   Procedure: DILATATION & CURETTAGE/HYSTEROSCOPY WITH HYDROTHERMAL ABLATION;  Surgeon: Frederico Hamman, MD;  Location: [facility];  Service: Gynecology;  Laterality: N/A;  . 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 GASTRIC OUTLET OBSTRUCTION RELEASE  2005   post gastric bypass  . TUBAL LIGATION  2002    Family History: Family History  Problem Relation Age of Onset  . Hypertension Mother   . Hepatitis Mother   . Hyperthyroidism Mother   . Heart disease Mother   . Stroke Mother   . [place] disease Mother   . Depression Mother   . Obesity Sister   . Hypertension Sister   . Cancer Maternal Grandmother        uterine  . Heart disease Maternal Grandmother   . Hypertension Maternal Grandmother   . Cancer Maternal Grandfather        multiple myeloma  . Hypertension Maternal Grandfather   . Bipolar disorder Father   . Hypertension Father   . Hepatitis Father   . Migraines Daughter     Social History: Social History   Socioeconomic History  . Marital status: Married    Spouse name: Not on file  . Number of children: 5  . Years of education: some colle  . Highest education level: Not on file  Occupational History  . Occupation: Massage Therapist     Comment: Kneaded Injury   Social Needs  . Financial resource strain: Not on file  . Food insecurity:    Worry: Not on file    Inability: Not on file  . Transportation needs:    Medical: Not on file    Non-medical: Not on file  Tobacco Use  . Smoking status: Never Smoker  . Smokeless tobacco: Never Used  Substance and Sexual Activity  . Alcohol use: Yes    Comment: rare   . Drug use: No  . Sexual activity: Yes    Partners: Female    Birth  control/protection: Surgical  Lifestyle  . Physical activity:    Days per week: Not on file    Minutes per session: Not on file  . Stress: Not on file  Relationships  . Social connections:    Talks on phone: Not on file    Gets together: Not on file    Attends  religious service: Not on file    Active member of club or organization: Not on file    Attends meetings of clubs or organizations: Not on file    Relationship status: Not on file  . Intimate partner violence:    Fear of current or ex partner: Not on file    Emotionally abused: Not on file    Physically abused: Not on file    Forced sexual activity: Not on file  Other Topics Concern  . Not on file  Social History Narrative   Marital status: married x 18 years      Children: four children; 1 grandchildren (3)      Lives: with husband, youngest son.      Employment: massage therapist part time in 2019.      Tobacco: none      Alcohol: none      Exercise: walking 30 minutes 3 times per week.     Allergies: Allergies  Allergen Reactions  . Nsaids Other (See Comments)    Had gastric bypass sx- Pt has a sensitivity to drug Pt states that she can take NSAIDS but should use them in moderation    Medications:  Current Outpatient Medications  Medication Sig Dispense Refill  . acetaminophen (TYLENOL) 500 MG tablet Take 1,000 mg by mouth every 6 (six) hours as needed for headache.    . ALPRAZolam (XANAX) 0.5 MG tablet 1 AT MIDDAY AND 2 TABLETS AT BEDTIME 60 tablet 0  . amLODipine (NORVASC) 5 MG tablet Take 1 tablet (5 mg total) by mouth daily. 90 tablet 1  . Calcium Carbonate-Vit D-Min (CALCIUM 1200 PO) Take 1 tablet by mouth at bedtime.     . Cyanocobalamin (VITAMIN B 12 PO) Take 1 tablet by mouth daily.     Marland Kitchen FLUoxetine (PROZAC) 40 MG capsule Take 1 capsule (40 mg total) by mouth daily. 90 capsule 1  . folic acid (FOLVITE) 1 MG tablet Take 1 tablet (1 mg total) by mouth daily. 30 tablet 0  . lisinopril-hydrochlorothiazide  (PRINZIDE,ZESTORETIC) 10-12.5 MG tablet TAKE 1 TABLET BY MOUTH DAILY 90 tablet 1  . Multiple Vitamin (MULTIVITAMIN) tablet Take 1 tablet by mouth daily.    . cyclobenzaprine (FLEXERIL) 10 MG tablet TAKE 1 TABLET BY MOUTH AT BEDTIME. (Patient not taking: Reported on 02/15/2018) 90 tablet 0  . ferrous gluconate (FERGON) 324 MG tablet Take 1 tablet (324 mg total) by mouth 3 (three) times daily with meals. (Patient not taking: Reported on 02/15/2018) 90 tablet 3   Current Facility-Administered Medications  Medication Dose Route Frequency Provider Last Rate Last Dose  . cyanocobalamin ((VITAMIN B-12)) injection 1,000 mcg  1,000 mcg Intramuscular Q30 days Wardell Honour, MD   1,000 mcg at 02/01/18 1336  . cyanocobalamin ((VITAMIN B-12)) injection 1,000 mcg  1,000 mcg Intramuscular Q7 days Wardell Honour, MD        Review of Systems: Review of Systems  Constitutional: Positive for fatigue.  All other systems reviewed and are negative.    PHYSICAL EXAMINATION Blood pressure (!) 147/98, pulse 72, temperature 99.3 F (37.4 C), temperature source Oral, resp. rate 18, height 5' 6"  (1.676 m), weight 155 lb 12.8 oz (70.7 kg), SpO2 100 %.  ECOG PERFORMANCE STATUS: 2 - Symptomatic, <50% confined to bed  Physical Exam  Constitutional: She is oriented to person, place, and time and well-developed, well-nourished, and in no distress. No distress.  HENT:  Head: Normocephalic and atraumatic.  Mouth/Throat: Oropharynx is clear and moist. No  oropharyngeal exudate.  Eyes: Pupils are equal, round, and reactive to light. Conjunctivae and EOM are normal. No scleral icterus.  Neck: No thyromegaly present.  Cardiovascular: Normal rate, regular rhythm, normal heart sounds and intact distal pulses.  No murmur heard. Pulmonary/Chest: Effort normal and breath sounds normal. No respiratory distress. She has no wheezes. She has no rales.  Abdominal: Soft. Bowel sounds are normal. She exhibits no distension and no  mass. There is no tenderness. There is no guarding.  Musculoskeletal: She exhibits no edema.  Lymphadenopathy:    She has no cervical adenopathy.  Neurological: She is alert and oriented to person, place, and time. She has normal reflexes. No cranial nerve deficit.  Skin: Skin is warm and dry. No rash noted. She is not diaphoretic. No erythema. No pallor.     LABORATORY DATA: I have personally reviewed the data as listed: Appointment on 02/15/2018  Component Date Value Ref Range Status  . WBC Count 02/15/2018 7.0  3.9 - 10.3 K/uL Final  . RBC 02/15/2018 4.18  3.70 - 5.45 MIL/uL Final  . Hemoglobin 02/15/2018 10.6* 11.6 - 15.9 g/dL Final  . HCT 02/15/2018 34.0* 34.8 - 46.6 % Final  . MCV 02/15/2018 81.4  79.5 - 101.0 fL Final  . MCH 02/15/2018 25.4  25.1 - 34.0 pg Final  . MCHC 02/15/2018 31.2* 31.5 - 36.0 g/dL Final  . RDW 02/15/2018 29.5* 11.2 - 14.5 % Final  . Platelet Count 02/15/2018 498* 145 - 400 K/uL Final  . Neutrophils Relative % 02/15/2018 76  % Final  . Neutro Abs 02/15/2018 5.4  1.5 - 6.5 K/uL Final  . Lymphocytes Relative 02/15/2018 12  % Final  . Lymphs Abs 02/15/2018 0.8* 0.9 - 3.3 K/uL Final  . Monocytes Relative 02/15/2018 8  % Final  . Monocytes Absolute 02/15/2018 0.5  0.1 - 0.9 K/uL Final  . Eosinophils Relative 02/15/2018 3  % Final  . Eosinophils Absolute 02/15/2018 0.2  0.0 - 0.5 K/uL Final  . Basophils Relative 02/15/2018 1  % Final  . Basophils Absolute 02/15/2018 0.1  0.0 - 0.1 K/uL Final   Performed at [facility], [place] [address], [place], [place] [address]  . Sodium 02/15/2018 138  136 - 145 mmol/L Final  . Potassium 02/15/2018 4.4  3.5 - 5.1 mmol/L Final  . Chloride 02/15/2018 108  98 - 109 mmol/L Final  . CO2 02/15/2018 24  22 - 29 mmol/L Final  . Glucose, Bld 02/15/2018 89  70 - 140 mg/dL Final  . BUN 02/15/2018 14  7 - 26 mg/dL Final  . Creatinine 02/15/2018 0.71  0.60 - 1.10 mg/dL Final  . Calcium 02/15/2018 9.3  8.4 -  10.4 mg/dL Final  . Total Protein 02/15/2018 7.1  6.4 - 8.3 g/dL Final  . Albumin 02/15/2018 3.4* 3.5 - 5.0 g/dL Final  . AST 02/15/2018 16  5 - 34 U/L Final  . ALT 02/15/2018 16  0 - 55 U/L Final  . Alkaline Phosphatase 02/15/2018 83  40 - 150 U/L Final  . Total Bilirubin 02/15/2018 0.6  0.2 - 1.2 mg/dL Final  . GFR, Est Non Af Am 02/15/2018 >60  >60 mL/min Final  . GFR, Est AFR Am 02/15/2018 >60  >60 mL/min Final   Comment: (NOTE) The eGFR has been calculated using the CKD EPI equation. This calculation has not been validated in all clinical situations. eGFR's persistently <60 mL/min signify possible Chronic Kidney Disease.   Georgiann Hahn gap 02/15/2018  6  3 - 11 Final   Performed at [facility], [place] [address], [place], [place] [address]  . Iron 02/15/2018 26* 41 - 142 ug/dL Final  . TIBC 02/15/2018 365  236 - 444 ug/dL Final  . Saturation Ratios 02/15/2018 7* 21 - 57 % Final  . UIBC 02/15/2018 339  ug/dL Final   Performed at [facility], [place] [address], [place], [place] [address]  . Ferritin 02/15/2018 38  9 - 269 ng/mL Final   Performed at [facility], [place] [address] [place], [place] [address]         Ardath Sax, MD

## 2018-03-03 ENCOUNTER — Inpatient Hospital Stay: Payer: Federal, State, Local not specified - PPO

## 2018-03-03 VITALS — BP 108/54 | HR 97 | Temp 98.6°F | Resp 18

## 2018-03-03 DIAGNOSIS — Z79899 Other long term (current) drug therapy: Secondary | ICD-10-CM | POA: Diagnosis not present

## 2018-03-03 DIAGNOSIS — Z9884 Bariatric surgery status: Secondary | ICD-10-CM | POA: Diagnosis not present

## 2018-03-03 DIAGNOSIS — D508 Other iron deficiency anemias: Secondary | ICD-10-CM

## 2018-03-03 DIAGNOSIS — D509 Iron deficiency anemia, unspecified: Secondary | ICD-10-CM | POA: Diagnosis not present

## 2018-03-03 DIAGNOSIS — E538 Deficiency of other specified B group vitamins: Secondary | ICD-10-CM | POA: Diagnosis not present

## 2018-03-03 MED ORDER — SODIUM CHLORIDE 0.9 % IV SOLN
Freq: Once | INTRAVENOUS | Status: AC
  Administered 2018-03-03: 15:00:00 via INTRAVENOUS

## 2018-03-03 MED ORDER — SODIUM CHLORIDE 0.9 % IV SOLN
510.0000 mg | Freq: Once | INTRAVENOUS | Status: AC
  Administered 2018-03-03: 510 mg via INTRAVENOUS
  Filled 2018-03-03: qty 17

## 2018-03-03 NOTE — Patient Instructions (Signed)

## 2018-03-06 ENCOUNTER — Ambulatory Visit: Payer: Federal, State, Local not specified - PPO | Admitting: Family Medicine

## 2018-03-10 ENCOUNTER — Telehealth: Payer: Self-pay | Admitting: Hematology and Oncology

## 2018-03-10 NOTE — Telephone Encounter (Signed)
Call day - moved 4/24 appointments to earlier time - other appointments remain the same. Not able to reach patient by phone or leave message. Update appointments mailed.

## 2018-03-27 ENCOUNTER — Inpatient Hospital Stay: Payer: Federal, State, Local not specified - PPO

## 2018-03-28 ENCOUNTER — Inpatient Hospital Stay: Payer: Federal, State, Local not specified - PPO | Attending: Hematology and Oncology

## 2018-03-28 ENCOUNTER — Other Ambulatory Visit: Payer: Self-pay

## 2018-03-28 ENCOUNTER — Encounter: Payer: Self-pay | Admitting: Family Medicine

## 2018-03-28 ENCOUNTER — Ambulatory Visit: Payer: Federal, State, Local not specified - PPO | Admitting: Family Medicine

## 2018-03-28 VITALS — BP 122/82 | HR 94 | Temp 98.0°F | Resp 16 | Wt 155.0 lb

## 2018-03-28 DIAGNOSIS — D509 Iron deficiency anemia, unspecified: Secondary | ICD-10-CM | POA: Diagnosis not present

## 2018-03-28 DIAGNOSIS — M5137 Other intervertebral disc degeneration, lumbosacral region: Secondary | ICD-10-CM

## 2018-03-28 DIAGNOSIS — E538 Deficiency of other specified B group vitamins: Secondary | ICD-10-CM | POA: Insufficient documentation

## 2018-03-28 DIAGNOSIS — D508 Other iron deficiency anemias: Secondary | ICD-10-CM

## 2018-03-28 DIAGNOSIS — Z9884 Bariatric surgery status: Secondary | ICD-10-CM

## 2018-03-28 DIAGNOSIS — I10 Essential (primary) hypertension: Secondary | ICD-10-CM

## 2018-03-28 DIAGNOSIS — D513 Other dietary vitamin B12 deficiency anemia: Secondary | ICD-10-CM

## 2018-03-28 DIAGNOSIS — M25511 Pain in right shoulder: Secondary | ICD-10-CM

## 2018-03-28 DIAGNOSIS — G8929 Other chronic pain: Secondary | ICD-10-CM | POA: Diagnosis not present

## 2018-03-28 DIAGNOSIS — M51379 Other intervertebral disc degeneration, lumbosacral region without mention of lumbar back pain or lower extremity pain: Secondary | ICD-10-CM

## 2018-03-28 DIAGNOSIS — M519 Unspecified thoracic, thoracolumbar and lumbosacral intervertebral disc disorder: Secondary | ICD-10-CM | POA: Diagnosis not present

## 2018-03-28 DIAGNOSIS — F411 Generalized anxiety disorder: Secondary | ICD-10-CM

## 2018-03-28 DIAGNOSIS — M545 Low back pain: Secondary | ICD-10-CM

## 2018-03-28 DIAGNOSIS — M19011 Primary osteoarthritis, right shoulder: Secondary | ICD-10-CM

## 2018-03-28 DIAGNOSIS — M5136 Other intervertebral disc degeneration, lumbar region: Secondary | ICD-10-CM | POA: Diagnosis not present

## 2018-03-28 LAB — CMP (CANCER CENTER ONLY)
ALBUMIN: 3.4 g/dL — AB (ref 3.5–5.0)
ALT: 35 U/L (ref 0–55)
AST: 37 U/L — AB (ref 5–34)
Alkaline Phosphatase: 113 U/L (ref 40–150)
Anion gap: 9 (ref 3–11)
BUN: 15 mg/dL (ref 7–26)
CHLORIDE: 108 mmol/L (ref 98–109)
CO2: 23 mmol/L (ref 22–29)
Calcium: 9.1 mg/dL (ref 8.4–10.4)
Creatinine: 0.74 mg/dL (ref 0.60–1.10)
GFR, Est AFR Am: >60 mL/min (ref >60)
GLUCOSE: 50 mg/dL — AB (ref 70–140)
POTASSIUM: 3.7 mmol/L (ref 3.5–5.1)
Sodium: 140 mmol/L (ref 136–145)
Total Bilirubin: <0.2 mg/dL — ABNORMAL LOW (ref 0.2–1.2)
Total Protein: 7.2 g/dL (ref 6.4–8.3)

## 2018-03-28 LAB — CBC WITH DIFFERENTIAL (CANCER CENTER ONLY)
Basophils Absolute: 0 10*3/uL (ref 0.0–0.1)
Basophils Relative: 0 %
EOS PCT: 4 %
Eosinophils Absolute: 0.2 10*3/uL (ref 0.0–0.5)
HEMATOCRIT: 39.8 % (ref 34.8–46.6)
Hemoglobin: 12.2 g/dL (ref 11.6–15.9)
LYMPHS ABS: 1 10*3/uL (ref 0.9–3.3)
LYMPHS PCT: 20 %
MCH: 28.5 pg (ref 25.1–34.0)
MCHC: 30.7 g/dL — ABNORMAL LOW (ref 31.5–36.0)
MCV: 93 fL (ref 79.5–101.0)
Monocytes Absolute: 0.5 10*3/uL (ref 0.1–0.9)
Monocytes Relative: 10 %
NEUTROS ABS: 3.4 10*3/uL (ref 1.5–6.5)
Neutrophils Relative %: 66 %
PLATELETS: 339 10*3/uL (ref 145–400)
RBC: 4.28 MIL/uL (ref 3.70–5.45)
RDW: 19.7 % — ABNORMAL HIGH (ref 11.2–14.5)
WBC Count: 5.2 10*3/uL (ref 3.9–10.3)

## 2018-03-28 LAB — IRON AND TIBC
IRON: 46 ug/dL (ref 41–142)
SATURATION RATIOS: 18 % — AB (ref 21–57)
TIBC: 249 ug/dL (ref 236–444)
UIBC: 203 ug/dL

## 2018-03-28 LAB — LACTATE DEHYDROGENASE: LDH: 214 U/L (ref 125–245)

## 2018-03-28 LAB — FOLATE: Folate: 10.9 ng/mL (ref >5.9)

## 2018-03-28 LAB — FERRITIN: Ferritin: 151 ng/mL (ref 9–269)

## 2018-03-28 LAB — VITAMIN B12: Vitamin B-12: 322 pg/mL (ref 180–914)

## 2018-03-28 MED ORDER — CYCLOBENZAPRINE HCL 10 MG PO TABS: ORAL_TABLET | ORAL | 1 refills | Status: DC

## 2018-03-28 MED ORDER — FOLIC ACID 1 MG PO TABS: 1.0000 mg | ORAL_TABLET | Freq: Every day | ORAL | 1 refills | Status: DC

## 2018-03-28 NOTE — Patient Instructions (Signed)
     IF you received an x-ray today, you will receive an invoice from [facility]. Please contact [facility] at [phone] with questions or concerns regarding your invoice.   IF you received labwork today, you will receive an invoice from [facility]. Please contact [facility] at [phone] with questions or concerns regarding your invoice.   Our billing staff will not be able to assist you with questions regarding bills from these companies.  You will be contacted with the lab results as soon as they are available. The fastest way to get your results is to activate your My Chart account. Instructions are located on the last page of this paperwork. If you have not heard from us regarding the results in 2 weeks, please contact this office.     
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
## 2018-03-29 ENCOUNTER — Inpatient Hospital Stay: Payer: Federal, State, Local not specified - PPO

## 2018-03-29 ENCOUNTER — Inpatient Hospital Stay (HOSPITAL_BASED_OUTPATIENT_CLINIC_OR_DEPARTMENT_OTHER): Payer: Federal, State, Local not specified - PPO | Admitting: Hematology and Oncology

## 2018-03-29 ENCOUNTER — Telehealth: Payer: Self-pay | Admitting: Family Medicine

## 2018-03-29 ENCOUNTER — Telehealth: Payer: Self-pay

## 2018-03-29 ENCOUNTER — Encounter: Payer: Self-pay | Admitting: Hematology and Oncology

## 2018-03-29 VITALS — BP 124/75 | HR 77 | Temp 98.5°F | Resp 18 | Ht 66.0 in | Wt 160.6 lb

## 2018-03-29 DIAGNOSIS — E538 Deficiency of other specified B group vitamins: Secondary | ICD-10-CM

## 2018-03-29 DIAGNOSIS — D509 Iron deficiency anemia, unspecified: Secondary | ICD-10-CM

## 2018-03-29 DIAGNOSIS — D508 Other iron deficiency anemias: Secondary | ICD-10-CM

## 2018-03-29 DIAGNOSIS — D513 Other dietary vitamin B12 deficiency anemia: Secondary | ICD-10-CM

## 2018-03-29 NOTE — Telephone Encounter (Signed)
Patient declined avs and calender due to MyChart. Per 4/24 los

## 2018-03-29 NOTE — Telephone Encounter (Signed)
Called pt and spoke with her about Dr. Katrinka BlazingSmith leaving. She doesnt know if she will stay here or if she will follow Dr. Katrinka BlazingSmith when she announces where she is going.   Made her an app[facility] in the [facility].

## 2018-03-31 ENCOUNTER — Telehealth: Payer: Self-pay | Admitting: Family Medicine

## 2018-03-31 LAB — METHYLMALONIC ACID, SERUM: METHYLMALONIC ACID, QUANTITATIVE: 268 nmol/L (ref 0–378)

## 2018-03-31 NOTE — Telephone Encounter (Signed)
Copied from CRM [phone]. Topic: Quick Communication - See Telephone Encounter >> Mar 31, 2018  2:15 PM Arlyss Gandyichardson, Vivek Grealish N, NT wrote: CRM for notification. See Telephone encounter for: 03/31/18. Pt calling to check status on having meloxican and B12 injections sent to her pharmacy so that she may do these at home. [facility] [phone] - [place], [place] - [address] [facility] [address] [phone] (Phone) [phone] (Fax)

## 2018-03-31 NOTE — Telephone Encounter (Signed)
Please advise 

## 2018-04-04 MED ORDER — CYANOCOBALAMIN 1000 MCG/ML IJ SOLN: 1000.0000 ug | INTRAMUSCULAR | 11 refills | Status: AC

## 2018-04-04 MED ORDER — MELOXICAM 7.5 MG PO TABS: 7.5000 mg | ORAL_TABLET | Freq: Every day | ORAL | 0 refills | Status: DC

## 2018-04-06 DIAGNOSIS — M19011 Primary osteoarthritis, right shoulder: Secondary | ICD-10-CM | POA: Diagnosis not present

## 2018-04-06 DIAGNOSIS — M545 Low back pain: Secondary | ICD-10-CM | POA: Diagnosis not present

## 2018-04-10 ENCOUNTER — Other Ambulatory Visit: Payer: Self-pay | Admitting: Family Medicine

## 2018-04-10 DIAGNOSIS — M519 Unspecified thoracic, thoracolumbar and lumbosacral intervertebral disc disorder: Secondary | ICD-10-CM

## 2018-04-18 NOTE — Progress Notes (Signed)
[facility] Follow-up Visit:  Assessment: Anemia 42 y.o. iron depletion resulting in severe hypochromic and microcytic anemia with nadir hemoglobin of 4.5 measured on 01/25/18.  In addition, patient was found to have B12 deficiency with elevated MMA. Etiology of the depletion is likely impaired absorption of iron & B12 due to previous history of gastric bypass surgery.  Parenteral B12 replacement as well as parenteral iron administration.  To have helped significantly and anemia is resolving.  Patient is feeling significantly better with increased performance status.  Plan: -Recommend additional GI/general surgery follow-up to assess for possible GI blood loss with altered gastrointestinal anatomy -No need for additional Feraheme today: We will target to keep ferritin between 50 and 100 as long as patient remains outside of anemia range by hemoglobin.  Higher ferritin values can be targeted if persistent microcytosis, hypochromia, and anemia are detected -Return to clinic in 1 month with labs obtained 3 days prior to assess level of iron replacement.   Voice recognition software was used and creation of this note. Despite my best effort at editing the text, some misspelling/errors may have occurred.  Orders Placed This Encounter  Procedures  . CBC with Differential (Cancer Center Only)    Standing Status:   Future    Standing Expiration Date:   03/30/2019  . CMP ([facility] only)    Standing Status:   Future    Standing Expiration Date:   03/30/2019  . Iron and TIBC    Standing Status:   Future    Standing Expiration Date:   03/30/2019  . Ferritin    Standing Status:   Future    Standing Expiration Date:   03/30/2019  . Vitamin B12    Standing Status:   Future    Standing Expiration Date:   03/29/2019  . Methylmalonic acid, serum    Standing Status:   Future    Standing Expiration Date:   03/29/2019    Cancer Staging No matching staging information was found for  the patient.  All questions were answered.  . The patient knows to call the clinic with any problems, questions or concerns.  This note was electronically signed.    History of Presenting Illness Tammy [name] is a 42 y.o. female followed in the [place] for severe anemia due to iron deficiency and B12 deficiency in context of previous history of gastric bypass surgery, referred by Dr Steffanie Dunn Koren Bound.  Patient's past medical history significant for hypertension, history of kidney disorder, anxiety, and history of gastric bypass.  Patient has been receiving ferrous gluconate 324 mg 3 times a day and reports some compliance with the medication.  Denies any active alcohol use, no smoking.  At the present time, her only complaint is profound fatigue, otherwise she denies any new complaints.  Patient has not had any recent follow-up with bariatric surgery or gastroenterology.  Since last visit to the clinic, patient has received 2 infusions of Feraheme.  In the interim, patient reports feeling significantly better with market decrease in fatigue and resolution of previous shortness of breath and lightheadedness.  Tolerated the infusions themselves without complications.  Otherwise, no new symptoms.  Oncological/hematological History: --Treatment:             --Transfusion, Sep 2013: pRBC x3 units --Labs, 08/06/13: Hgb 14.7, MCV 96.5, MCH 30.9, MCHC 32.1, RDW     ..., Plt    ...;  --Labs, 09/24/15: Hgb   8.3, MCV 76.7, MCH 22.0, MCHC  28.7, RDW 25.5, Plt 485; --Labs, 01/25/18: Hgb   4.5, MCV 72.4, MCH 18.1, MCHC 25.0, RDW 29.3, Plt 237; Fe 6, FeSat   1%, TIBC 517, Ferritin      4, Folate   5.8, Vit B12 341; TSH 0.99; FOBT -- negative             --Transfusion, 01/25/18: pRBC x3 units --Labs, 01/26/18: Hgb   8.0, MCV 76.5, MCH 22.7, MCHC 29.6, RDW 23.7, Plt 219; Cu 86,          MMA 295 --Labs, 02/01/18: Hgb 10.1, MCV 79.0, MCH 23.5, MCHC 29.8, RDW 29.0, Plt 539;  --Labs, 02/15/18: Hgb 10.6,  MCV 81.4, MCH 25.4, MCHC 31.2, RDW 29.5, Plt 498; Fe 26, FeSat 7%, TIBC 365, Ferritin   38; LDH 214  --Feraheme 519m IV x1, 02/24/18:  --Feraheme 518mIV x1, 03/03/18: --Labs, 03/28/18: Hgb 12.2, MCV 93.0, MCH 28.5, MCHC 30.7, RDW 19.7, Plt 339; Fe 46,FeSat 18%, TIBC 249, Ferritin 151, Folate 10.9, Vit B12 322,  MMA 268;    No history exists.    Medical History: Past Medical History:  Diagnosis Date  . Allergy   . Anxiety   . Blood transfusion without reported diagnosis   . Chronic right shoulder pain   . Fe deficiency anemia   . History of seizure disorder   . Hypertension   . Migraines   . SVD (spontaneous vaginal delivery)    x 5    Surgical History: Past Surgical History:  Procedure Laterality Date  . DILITATION & CURRETTAGE/HYSTROSCOPY WITH HYDROTHERMAL ABLATION  10/11/2012   Procedure: DILATATION & CURETTAGE/HYSTEROSCOPY WITH HYDROTHERMAL ABLATION;  Surgeon: BeFrederico HammanMD;  [facility];  Service: Gynecology;  Laterality: N/A;  . 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 GASTRIC OUTLET OBSTRUCTION RELEASE  2005   post gastric bypass  . TUBAL LIGATION  2002    Family History: Family History  Problem Relation Age of Onset  . Hypertension Mother   . Hepatitis Mother   . Hyperthyroidism Mother   . Heart disease Mother   . Stroke Mother   . [place] disease Mother   . Depression Mother   . Obesity Sister   . Hypertension Sister   . Cancer Maternal Grandmother        uterine  . Heart disease Maternal Grandmother   . Hypertension Maternal Grandmother   . Cancer Maternal Grandfather        multiple myeloma  . Hypertension Maternal Grandfather   . Bipolar disorder Father   . Hypertension Father   . Hepatitis Father   . Migraines Daughter     Social History: Social History   Socioeconomic History  . Marital status: Married    Spouse name: Not on file  . Number of children: 5  . Years of education: some colle  .  Highest education level: Not on file  Occupational History  . Occupation: Massage Therapist     Comment: Kneaded Injury   Social Needs  . Financial resource strain: Not on file  . Food insecurity:    Worry: Not on file    Inability: Not on file  . Transportation needs:    Medical: Not on file    Non-medical: Not on file  Tobacco Use  . Smoking status: Never Smoker  . Smokeless tobacco: Never Used  Substance and Sexual Activity  . Alcohol use: Yes    Comment: rare   . Drug use: No  .  Sexual activity: Yes    Partners: Female    Birth control/protection: Surgical  Lifestyle  . Physical activity:    Days per week: Not on file    Minutes per session: Not on file  . Stress: Not on file  Relationships  . Social connections:    Talks on phone: Not on file    Gets together: Not on file    Attends religious service: Not on file    Active member of club or organization: Not on file    Attends meetings of clubs or organizations: Not on file    Relationship status: Not on file  . Intimate partner violence:    Fear of current or ex partner: Not on file    Emotionally abused: Not on file    Physically abused: Not on file    Forced sexual activity: Not on file  Other Topics Concern  . Not on file  Social History Narrative   Marital status: married x 18 years      Children: four children; 1 grandchildren (3)      Lives: with husband, youngest son.      Employment: massage therapist part time in 2019.      Tobacco: none      Alcohol: none      Exercise: walking 30 minutes 3 times per week.     Allergies: Allergies  Allergen Reactions  . Nsaids Other (See Comments)    Had gastric bypass sx- Pt has a sensitivity to drug Pt states that she can take NSAIDS but should use them in moderation    Medications:  Current Outpatient Medications  Medication Sig Dispense Refill  . ALPRAZolam (XANAX) 0.5 MG tablet 1 AT MIDDAY AND 2 TABLETS AT BEDTIME 60 tablet 0  . amLODipine (NORVASC)  5 MG tablet Take 1 tablet (5 mg total) by mouth daily. 90 tablet 1  . Calcium Carbonate-Vit D-Min (CALCIUM 1200 PO) Take 1 tablet by mouth at bedtime.     . cyclobenzaprine (FLEXERIL) 10 MG tablet TAKE 1 TABLET BY MOUTH AT BEDTIME. 90 tablet 1  . FLUoxetine (PROZAC) 40 MG capsule Take 1 capsule (40 mg total) by mouth daily. 90 capsule 1  . folic acid (FOLVITE) 1 MG tablet Take 1 tablet (1 mg total) by mouth daily. 90 tablet 1  . lisinopril-hydrochlorothiazide (PRINZIDE,ZESTORETIC) 10-12.5 MG tablet TAKE 1 TABLET BY MOUTH DAILY 90 tablet 1  . Multiple Vitamin (MULTIVITAMIN) tablet Take 1 tablet by mouth daily.    . cyanocobalamin (,VITAMIN B-12,) 1000 MCG/ML injection Inject 1 mL (1,000 mcg total) into the muscle every 30 (thirty) days. 1 mL 11  . meloxicam (MOBIC) 7.5 MG tablet Take 1 tablet (7.5 mg total) by mouth daily. 30 tablet 0   No current facility-administered medications for this visit.     Review of Systems: Review of Systems  Constitutional: Positive for fatigue.  All other systems reviewed and are negative.    PHYSICAL EXAMINATION Blood pressure 124/75, pulse 77, temperature 98.5 F (36.9 C), temperature source Oral, resp. rate 18, height 5' 6"  (1.676 m), weight 160 lb 9.6 oz (72.8 kg), SpO2 100 %.  ECOG PERFORMANCE STATUS: 1 - Symptomatic but completely ambulatory  Physical Exam  Constitutional: She is oriented to person, place, and time. She appears well-developed and well-nourished. No distress.  HENT:  Head: Normocephalic and atraumatic.  Mouth/Throat: Oropharynx is clear and moist. No oropharyngeal exudate.  Eyes: Pupils are equal, round, and reactive to light. Conjunctivae and EOM are normal.  No scleral icterus.  Neck: No thyromegaly present.  Cardiovascular: Normal rate, regular rhythm, normal heart sounds and intact distal pulses. Exam reveals no gallop and no friction rub.  No murmur heard. Pulmonary/Chest: Effort normal and breath sounds normal. No stridor. No  respiratory distress. She has no wheezes. She has no rales.  Abdominal: Soft. Bowel sounds are normal. She exhibits no distension and no mass. There is no tenderness. There is no guarding.  Musculoskeletal: She exhibits no edema.  Lymphadenopathy:    She has no cervical adenopathy.  Neurological: She is alert and oriented to person, place, and time. She displays normal reflexes. No cranial nerve deficit or sensory deficit.  Skin: Skin is warm and dry. No rash noted. She is not diaphoretic. No erythema. No pallor.     LABORATORY DATA: I have personally reviewed the data as listed: Appointment on 03/28/2018  Component Date Value Ref Range Status  . Methylmalonic Acid, Quantitative 03/28/2018 268  0 - 378 nmol/L Final  . Disclaimer: 03/28/2018 Comment   Final   Comment: (NOTE) This test was developed and its performance characteristics determined by [facility]. It has not been cleared or approved by the Food and Drug Administration. Performed At: [facility] [place], [place] [id number] Rush Farmer MD [id number] Performed at [facility], [place] [address] [place], [place] [address]   . Vitamin B-12 03/28/2018 322  180 - 914 pg/mL Final   Comment: (NOTE) This assay is not validated for testing neonatal or myeloproliferative syndrome specimens for Vitamin B12 levels. Performed at [facility], [place] [address], [place], [place] [address]   . Folate 03/28/2018 10.9  >5.9 ng/mL Final   Performed at [facility], [place] [address], [place], [place] [address]  . Ferritin 03/28/2018 151  9 - 269 ng/mL Final   Performed at [facility], [place] [address] [place], [place] [address]  . Iron 03/28/2018 46  41 - 142 ug/dL Final  . TIBC 03/28/2018 249  236 - 444 ug/dL Final  . Saturation Ratios 03/28/2018 18* 21 - 57 % Final  . UIBC 03/28/2018 203  ug/dL Final   Performed at [facility],  [place] [address] [place], [place] [address]  . LDH 03/28/2018 214  125 - 245 U/L Final   Performed at [facility], [place] [address] [place],  [address]  . Sodium 03/28/2018 140  136 - 145 mmol/L Final  . Potassium 03/28/2018 3.7  3.5 - 5.1 mmol/L Final  . Chloride 03/28/2018 108  98 - 109 mmol/L Final  . CO2 03/28/2018 23  22 - 29 mmol/L Final  . Glucose, Bld 03/28/2018 50* 70 - 140 mg/dL Final  . BUN 03/28/2018 15  7 - 26 mg/dL Final  . Creatinine 03/28/2018 0.74  0.60 - 1.10 mg/dL Final  . Calcium 03/28/2018 9.1  8.4 - 10.4 mg/dL Final  . Total Protein 03/28/2018 7.2  6.4 - 8.3 g/dL Final  . Albumin 03/28/2018 3.4* 3.5 - 5.0 g/dL Final  . AST 03/28/2018 37* 5 - 34 U/L Final  . ALT 03/28/2018 35  0 - 55 U/L Final  . Alkaline Phosphatase 03/28/2018 113  40 - 150 U/L Final  . Total Bilirubin 03/28/2018 <0.2* 0.2 - 1.2 mg/dL Final  . GFR, Est Non Af Am 03/28/2018 >60  >60 mL/min Final  . GFR, Est AFR Am 03/28/2018 >60  >60 mL/min Final   Comment: (  NOTE) The eGFR has been calculated using the CKD EPI equation. This calculation has not been validated in all clinical situations. eGFR's persistently <60 mL/min signify possible Chronic Kidney Disease.   Georgiann Hahn gap 03/28/2018 9  3 - 11 Final   Performed at [facility], [place] [address], [place], [place] [address]  . WBC Count 03/28/2018 5.2  3.9 - 10.3 K/uL Final  . RBC 03/28/2018 4.28  3.70 - 5.45 MIL/uL Final  . Hemoglobin 03/28/2018 12.2  11.6 - 15.9 g/dL Final  . HCT 03/28/2018 39.8  34.8 - 46.6 % Final  . MCV 03/28/2018 93.0  79.5 - 101.0 fL Final  . MCH 03/28/2018 28.5  25.1 - 34.0 pg Final  . MCHC 03/28/2018 30.7* 31.5 - 36.0 g/dL Final  . RDW 03/28/2018 19.7* 11.2 - 14.5 % Final  . Platelet Count 03/28/2018 339  145 - 400 K/uL Final  . Neutrophils Relative % 03/28/2018 66  % Final  . Neutro Abs 03/28/2018 3.4  1.5 - 6.5 K/uL Final  . Lymphocytes Relative 03/28/2018 20  %  Final  . Lymphs Abs 03/28/2018 1.0  0.9 - 3.3 K/uL Final  . Monocytes Relative 03/28/2018 10  % Final  . Monocytes Absolute 03/28/2018 0.5  0.1 - 0.9 K/uL Final  . Eosinophils Relative 03/28/2018 4  % Final  . Eosinophils Absolute 03/28/2018 0.2  0.0 - 0.5 K/uL Final  . Basophils Relative 03/28/2018 0  % Final  . Basophils Absolute 03/28/2018 0.0  0.0 - 0.1 K/uL Final   Performed at [facility], [place] [address], [place], [place] [address]       Ardath Sax, MD

## 2018-04-18 NOTE — Assessment & Plan Note (Signed)
42 y.o. iron depletion resulting in severe hypochromic and microcytic anemia with nadir hemoglobin of 4.5 measured on 01/25/18.  In addition, patient was found to have B12 deficiency with elevated MMA. Etiology of the depletion is likely impaired absorption of iron & B12 due to previous history of gastric bypass surgery.  Parenteral B12 replacement as well as parenteral iron administration.  To have helped significantly and anemia is resolving.  Patient is feeling significantly better with increased performance status.  Plan: -Recommend additional GI/general surgery follow-up to assess for possible GI blood loss with altered gastrointestinal anatomy -No need for additional Feraheme today: We will target to keep ferritin between 50 and 100 as long as patient remains outside of anemia range by hemoglobin.  Higher ferritin values can be targeted if persistent microcytosis, hypochromia, and anemia are detected -Return to clinic in 1 month with labs obtained 3 days prior to assess level of iron replacement.

## 2018-04-24 ENCOUNTER — Inpatient Hospital Stay: Payer: Federal, State, Local not specified - PPO | Attending: Hematology and Oncology

## 2018-04-26 ENCOUNTER — Inpatient Hospital Stay: Payer: Federal, State, Local not specified - PPO | Admitting: Oncology

## 2018-04-26 ENCOUNTER — Inpatient Hospital Stay: Payer: Federal, State, Local not specified - PPO

## 2018-04-27 ENCOUNTER — Encounter: Payer: Self-pay | Admitting: Family Medicine

## 2018-05-02 ENCOUNTER — Other Ambulatory Visit: Payer: Self-pay | Admitting: Family Medicine

## 2018-05-03 NOTE — Telephone Encounter (Signed)
Meloxicam refill Last OV:03/28/18 Last refill:04/04/18 30 tab/0 refill ZOX:[id number] Pharmacy: [id number] - [place], [place] - [address] AT [facility] [address] & [address] [phone] (Phone) [phone] (Fax)

## 2018-05-09 ENCOUNTER — Telehealth: Payer: Self-pay | Admitting: Nurse Practitioner

## 2018-05-09 NOTE — Telephone Encounter (Signed)
Appointments scheduled/ left message on voicemail for patient regarding date/time per 5/31 sch msg

## 2018-05-10 ENCOUNTER — Other Ambulatory Visit: Payer: Federal, State, Local not specified - PPO

## 2018-05-10 ENCOUNTER — Ambulatory Visit: Payer: Federal, State, Local not specified - PPO | Admitting: Oncology

## 2018-05-11 ENCOUNTER — Other Ambulatory Visit: Payer: Federal, State, Local not specified - PPO

## 2018-05-11 ENCOUNTER — Encounter (HOSPITAL_COMMUNITY): Payer: Federal, State, Local not specified - PPO

## 2018-05-12 ENCOUNTER — Ambulatory Visit: Payer: Federal, State, Local not specified - PPO | Admitting: Nurse Practitioner

## 2018-05-22 NOTE — Progress Notes (Deleted)
[facility]  [phone] [phone]  Clinic Follow up Note   Patient Care Team: Ethelda Chick, MD as PCP - General (Family Medicine) 05/22/2018  HISTORY OF PRESENT ILLNESS  The patient was initially seen by Dr. Gweneth Dimitri for severe iron deficiency anemia noted on 01/25/18 with hemoglobin 4.5 with history of B12 deficiency in the setting of gastric bypass surgery. She presented to ED for weakness and found to have severe anemia, she was hospitalized and transfused 2 units RBCs and given 1 dose IV Feraheme 510 mg. Per historical records she was first found to have iron deficiency anemia on 08/31/2012 with Hgb 4.1, ferritin 2, serum iron less than 10, TIBC and saturation ratios were not able to be calculated.  She was transfused 3 units RBCs. Next available iron studies on 01/25/2018 reveals serum iron 6, TIBC 517, 1% saturation and ferritin of 4.  B12 on 01/25/2018 within normal range. Dr. Gweneth Dimitri saw her 02/15/18 and began aggressive parenteral iron and B12 supplementation, she completed IV Feraheme again on 02/24/18 and 03/03/18 to keep ferritin >50. She has started monthly B12 injection. CBC and iron studies on 03/28/18 showed positive response to iron replacement with Hgb 12.2, ferritin 151, serum iron 46, and saturiation 18%. She was recommended to f/u with GI for further eval and f/u to assess for possible GI blood loss and altered GI anatomy secondary to her surgery.   Past medical history is positive for HTN, anemia, gastric bypass headache, seizure disorder, anxiety disorder, and osteoarthritis.  Today  INTERVAL HISTORY: Please see below for problem oriented charting.  REVIEW OF SYSTEMS:   Constitutional: Denies fevers, chills or abnormal weight loss Eyes: Denies blurriness of vision Ears, nose, mouth, throat, and face: Denies mucositis or sore throat Respiratory: Denies cough, dyspnea or wheezes Cardiovascular: Denies palpitation, chest discomfort or lower  extremity swelling Gastrointestinal:  Denies nausea, heartburn or change in bowel habits Skin: Denies abnormal skin rashes Lymphatics: Denies new lymphadenopathy or easy bruising Neurological:Denies numbness, tingling or new weaknesses Behavioral/Psych: Mood is stable, no new changes  All other systems were reviewed with the patient and are negative.  MEDICAL HISTORY:  Past Medical History:  Diagnosis Date  . Allergy   . Anxiety   . Blood transfusion without reported diagnosis   . Chronic right shoulder pain   . Fe deficiency anemia   . History of seizure disorder   . Hypertension   . Migraines   . SVD (spontaneous vaginal delivery)    x 5    SURGICAL HISTORY: Past Surgical History:  Procedure Laterality Date  . DILITATION & CURRETTAGE/HYSTROSCOPY WITH HYDROTHERMAL ABLATION  10/11/2012   Procedure: DILATATION & CURETTAGE/HYSTEROSCOPY WITH HYDROTHERMAL ABLATION;  Surgeon: Kathreen Cosier, MD;  Location: [facility];  Service: Gynecology;  Laterality: N/A;  . 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 GASTRIC OUTLET OBSTRUCTION RELEASE  2005   post gastric bypass  . TUBAL LIGATION  2002    I have reviewed the social history and family history with the patient and they are unchanged from previous note.  ALLERGIES:  is allergic to nsaids.  MEDICATIONS:  Current Outpatient Medications  Medication Sig Dispense Refill  . ALPRAZolam (XANAX) 0.5 MG tablet 1 AT MIDDAY AND 2 TABLETS AT BEDTIME 60 tablet 0  . amLODipine (NORVASC) 5 MG tablet Take 1 tablet (5 mg total) by mouth daily. 90 tablet 1  . Calcium Carbonate-Vit D-Min (CALCIUM 1200 PO) Take 1 tablet by  mouth at bedtime.     . cyanocobalamin (,VITAMIN B-12,) 1000 MCG/ML injection Inject 1 mL (1,000 mcg total) into the muscle every 30 (thirty) days. 1 mL 11  . cyclobenzaprine (FLEXERIL) 10 MG tablet TAKE 1 TABLET BY MOUTH AT BEDTIME. 90 tablet 1  . FLUoxetine (PROZAC) 40 MG capsule Take 1 capsule (40 mg  total) by mouth daily. 90 capsule 1  . folic acid (FOLVITE) 1 MG tablet Take 1 tablet (1 mg total) by mouth daily. 90 tablet 1  . lisinopril-hydrochlorothiazide (PRINZIDE,ZESTORETIC) 10-12.5 MG tablet TAKE 1 TABLET BY MOUTH DAILY 90 tablet 1  . meloxicam (MOBIC) 7.5 MG tablet Take 1 tablet (7.5 mg total) by mouth daily. 30 tablet 0  . Multiple Vitamin (MULTIVITAMIN) tablet Take 1 tablet by mouth daily.     No current facility-administered medications for this visit.     PHYSICAL EXAMINATION: ECOG PERFORMANCE STATUS: {CHL ONC ECOG PS:[phone]}  There were no vitals filed for this visit. There were no vitals filed for this visit.  GENERAL:alert, no distress and comfortable SKIN: skin color, texture, turgor are normal, no rashes or significant lesions EYES: normal, Conjunctiva are pink and non-injected, sclera clear OROPHARYNX:no exudate, no erythema and lips, buccal mucosa, and tongue normal  NECK: supple, thyroid normal size, non-tender, without nodularity LYMPH:  no palpable lymphadenopathy in the cervical, axillary or inguinal LUNGS: clear to auscultation and percussion with normal breathing effort HEART: regular rate & rhythm and no murmurs and no lower extremity edema ABDOMEN:abdomen soft, non-tender and normal bowel sounds Musculoskeletal:no cyanosis of digits and no clubbing  NEURO: alert & oriented x 3 with fluent speech, no focal motor/sensory deficits  LABORATORY DATA:  I have reviewed the data as listed CBC Latest Ref Rng & Units 03/28/2018 02/15/2018 02/01/2018  WBC 3.9 - 10.3 K/uL 5.2 7.0 19.9(H)  Hemoglobin 11.6 - 15.9 g/dL 13.012.2 10.6(L) 10.1(L)  Hematocrit 34.8 - 46.6 % 39.8 34.0(L) 33.9(L)  Platelets 145 - 400 K/uL 339 498(H) 539(H)     CMP Latest Ref Rng & Units 03/28/2018 02/15/2018 02/01/2018  Glucose 70 - 140 mg/dL 86(V50(L) 89 93  BUN 7 - 26 mg/dL 15 14 19   Creatinine 0.60 - 1.10 mg/dL 7.840.74 6.960.71 2.950.75  Sodium 136 - 145 mmol/L 140 138 137  Potassium 3.5 - 5.1 mmol/L  3.7 4.4 5.0  Chloride 98 - 109 mmol/L 108 108 104  CO2 22 - 29 mmol/L 23 24 18(L)  Calcium 8.4 - 10.4 mg/dL 9.1 9.3 9.2  Total Protein 6.4 - 8.3 g/dL 7.2 7.1 7.7  Total Bilirubin 0.2 - 1.2 mg/dL <2.8(U<0.2(L) 0.6 0.4  Alkaline Phos 40 - 150 U/L 113 83 86  AST 5 - 34 U/L 37(H) 16 9  ALT 0 - 55 U/L 35 16 10      RADIOGRAPHIC STUDIES: I have personally reviewed the radiological images as listed and agreed with the findings in the report. No results found.   ASSESSMENT & PLAN:  No problem-specific Assessment & Plan notes found for this encounter.   No orders of the defined types were placed in this encounter.  All questions were answered. The patient knows to call the clinic with any problems, questions or concerns. No barriers to learning was detected. I spent {CHL ONC TIME VISIT - [id number]}[phone]} counseling the patient face to face. The total time spent in the appointment was {CHL ONC TIME VISIT - [id number]}[phone]} and more than 50% was on counseling and review of test results     Brunilda Eble K  Azucena Cecil, NP 05/22/18

## 2018-05-23 ENCOUNTER — Ambulatory Visit: Payer: Federal, State, Local not specified - PPO | Admitting: Nurse Practitioner

## 2018-05-23 ENCOUNTER — Other Ambulatory Visit: Payer: Federal, State, Local not specified - PPO

## 2018-05-23 ENCOUNTER — Ambulatory Visit: Payer: Federal, State, Local not specified - PPO

## 2018-05-24 ENCOUNTER — Telehealth: Payer: Self-pay | Admitting: Hematology

## 2018-05-24 NOTE — Telephone Encounter (Signed)
Appointments scheduled patient notified per 6/18 sch msg

## 2018-05-25 NOTE — Progress Notes (Signed)
[facility]  [phone] [phone]  Clinic Follow up Note   Patient Care Team: Ethelda Chick, MD as PCP - General (Family Medicine)   Date of Service:  05/26/2018  History of Presenting Illness 02/15/18 by initial visit with Dr. Gweneth Dimitri "Tammy [name] 42 y.o. presenting [facility] the [facility] for severe anemia due to iron deficiency and B12 deficiency in context of previous history of gastric bypass surgery, referred by Dr Silva Bandy Dwan Bolt.  Patient's past medical history significant for hypertension, history of kidney disorder, anxiety, and history of gastric bypass.  Patient has been receiving ferrous gluconate 324 mg 3 times a day and reports some compliance with the medication.  Denies any active alcohol use, no smoking.  At the present time, her only complaint is profound fatigue, otherwise she denies any new complaints.  Patient has not had any recent follow-up with bariatric surgery or gastroenterology."  CURRENT THERAPY: iv feraheme as needed and vitB12 injection monthly (at home)   INTERVAL HISTORY:  Tammy [name] is here for a follow up of her Iron deficient Anemia. She was previously under the care of Der. Perlov and has transferred her care to me. This is my first visit with her.   She presents to the clinic today by herself.  She notes she had anemia in 2013 and was manageable until she was hospitalized in 01/2018. She notes she had endometrial ablation due to fibroids which reduced her period. With recent blood transfusions her periods returned with menorrhagia for 5 days straight. She does not plan to have anymore children and is willing to do any procedures necessary to stop her menorrhagia. She is currently looking for a new gynecologist as hers recently retired.    She notes right glenohumeral arthritis, and in her back, she notes HTN and a unexpected seizure in 2014. On review of symptoms, pt notes back pain from arthritis and her  overall energy is adequate.     REVIEW OF SYSTEMS:   Constitutional: Denies fevers, chills or abnormal weight loss Eyes: Denies blurriness of vision Ears, nose, mouth, throat, and face: Denies mucositis or sore throat Respiratory: Denies cough, dyspnea or wheezes Cardiovascular: Denies palpitation, chest discomfort or lower extremity swelling Gastrointestinal:  Denies nausea, heartburn or change in bowel habits Skin: Denies abnormal skin rashes MSK: (+) arthritis in right shoulder and back Lymphatics: Denies new lymphadenopathy or easy bruising Neurological:Denies numbness, tingling or new weaknesses Behavioral/Psych: Mood is stable, no new changes  All other systems were reviewed with the patient and are negative.  MEDICAL HISTORY:  Past Medical History:  Diagnosis Date  . Allergy   . Anxiety   . Blood transfusion without reported diagnosis   . Chronic right shoulder pain   . Fe deficiency anemia   . History of seizure disorder   . Hypertension   . Migraines   . SVD (spontaneous vaginal delivery)    x 5    SURGICAL HISTORY: Past Surgical History:  Procedure Laterality Date  . DILITATION & CURRETTAGE/HYSTROSCOPY WITH HYDROTHERMAL ABLATION  10/11/2012   Procedure: DILATATION & CURETTAGE/HYSTEROSCOPY WITH HYDROTHERMAL ABLATION;  Surgeon: Kathreen Cosier, MD;  Location: [facility];  Service: Gynecology;  Laterality: N/A;  . 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 GASTRIC OUTLET OBSTRUCTION RELEASE  2005   post gastric bypass  . TUBAL LIGATION  2002    I have reviewed the social history and family history with the patient and  they are unchanged from previous note.  ALLERGIES:  is allergic to nsaids.  MEDICATIONS:  Current Outpatient Medications  Medication Sig Dispense Refill  . ALPRAZolam (XANAX) 0.5 MG tablet 1 AT MIDDAY AND 2 TABLETS AT BEDTIME 60 tablet 0  . amLODipine (NORVASC) 5 MG tablet Take 1 tablet (5 mg total) by mouth daily. 90  tablet 1  . Calcium Carbonate-Vit D-Min (CALCIUM 1200 PO) Take 1 tablet by mouth at bedtime.     . cyanocobalamin (,VITAMIN B-12,) 1000 MCG/ML injection Inject 1 mL (1,000 mcg total) into the muscle every 30 (thirty) days. 1 mL 11  . cyclobenzaprine (FLEXERIL) 10 MG tablet TAKE 1 TABLET BY MOUTH AT BEDTIME. 90 tablet 1  . FLUoxetine (PROZAC) 40 MG capsule Take 1 capsule (40 mg total) by mouth daily. 90 capsule 1  . folic acid (FOLVITE) 1 MG tablet Take 1 tablet (1 mg total) by mouth daily. 90 tablet 1  . lisinopril-hydrochlorothiazide (PRINZIDE,ZESTORETIC) 10-12.5 MG tablet TAKE 1 TABLET BY MOUTH DAILY 90 tablet 1  . meloxicam (MOBIC) 7.5 MG tablet Take 1 tablet (7.5 mg total) by mouth daily. 30 tablet 0  . Multiple Vitamin (MULTIVITAMIN) tablet Take 1 tablet by mouth daily.     No current facility-administered medications for this visit.     PHYSICAL EXAMINATION: ECOG PERFORMANCE STATUS: 0 - Asymptomatic  Vitals:   05/26/18 0957  BP: (!) 134/96  Pulse: 64  Resp: 18  Temp: 98.9 F (37.2 C)  SpO2: 100%   Filed Weights   05/26/18 0957  Weight: 168 lb (76.2 kg)    GENERAL:alert, no distress and comfortable SKIN: skin color, texture, turgor are normal, no rashes or significant lesions EYES: normal, Conjunctiva are pink and non-injected, sclera clear OROPHARYNX:no exudate, no erythema and lips, buccal mucosa, and tongue normal  NECK: supple, thyroid normal size, non-tender, without nodularity LYMPH:  no palpable lymphadenopathy in the cervical, axillary or inguinal LUNGS: clear to auscultation and percussion with normal breathing effort HEART: regular rate & rhythm and no murmurs and no lower extremity edema ABDOMEN:abdomen soft, non-tender and normal bowel sounds Musculoskeletal:no cyanosis of digits and no clubbing  NEURO: alert & oriented x 3 with fluent speech, no focal motor/sensory deficits  LABORATORY DATA:  I have reviewed the data as listed CBC Latest Ref Rng & Units  05/26/2018 03/28/2018 02/15/2018  WBC 3.9 - 10.3 K/uL 4.0 5.2 7.0  Hemoglobin 11.6 - 15.9 g/dL 78.213.0 95.612.2 10.6(L)  Hematocrit 34.8 - 46.6 % 40.5 39.8 34.0(L)  Platelets 145 - 400 K/uL 318 339 498(H)     CMP Latest Ref Rng & Units 05/26/2018 03/28/2018 02/15/2018  Glucose 70 - 140 mg/dL 80 21(H50(L) 89  BUN 7 - 26 mg/dL 13 15 14   Creatinine 0.60 - 1.10 mg/dL 0.860.81 5.780.74 4.690.71  Sodium 136 - 145 mmol/L 138 140 138  Potassium 3.5 - 5.1 mmol/L 3.9 3.7 4.4  Chloride 98 - 109 mmol/L 107 108 108  CO2 22 - 29 mmol/L 24 23 24   Calcium 8.4 - 10.4 mg/dL 8.8 9.1 9.3  Total Protein 6.4 - 8.3 g/dL 6.9 7.2 7.1  Total Bilirubin 0.2 - 1.2 mg/dL 0.4 <6.2(X<0.2(L) 0.6  Alkaline Phos 40 - 150 U/L 75 113 83  AST 5 - 34 U/L 11 37(H) 16  ALT 0 - 55 U/L 7 35 16   Results for Tammy CheshireWILLIAMS, Tammy R (MRN [id number]) as of 05/26/2018 13:14  Ref. Range 03/28/2018 08:10 03/28/2018 08:11 05/26/2018 08:33  Iron Latest Ref Range: 41 - 142  ug/dL  46 63  UIBC Latest Units: ug/dL  604 540  TIBC Latest Ref Range: 236 - 444 ug/dL  981 191  Saturation Ratios Latest Ref Range: 21 - 57 %  18 (L) 21  Ferritin Latest Ref Range: 9 - 269 ng/mL  151 15  Folate Latest Ref Range: >5.9 ng/mL 10.9       RADIOGRAPHIC STUDIES: I have personally reviewed the radiological images as listed and agreed with the findings in the report. No results found.   ASSESSMENT & PLAN:  Tammy [name] is a 42 y.o. African-American female with HTN, anxiety and H/o of seizure.  1. Iron deficient anemia secondary to menorrhagia and gastric bypass surgery -I have reviewed her previous labs and medical records extensively.  I agree with Dr. Gweneth Dimitri that her lab test is consistent with severe iron deficient anemia, secondary to menorrhagia and gastric bypass surgery. -Patient has previously required blood transfusion due to severe anemia. -She received IV Feraheme 510mg  on 01/26/18, 02/24/18, 03/03/18, her counts responded well and her anemia resolved. She tolerated IV iron  well.  -She is going to see a gynecologist to discuss her menorrhagia and treatment. -Labs reviewed today, her iron panel and Hg is normal, ferritin is 15 today, so I suggest weekly IV iron for the next 2-3 weeks to maintain her levels. She is agreeable.   -We will continue lab for CBC and iron study every month, if her ferritin less than 50 I will set up IV Feraheme -F/u in 4 months    2. B12 Deficiency  -Secondary to h/o gastric bypass surgery -She has been on monthly injections at home since 03/2018, will continue.  -B12 normal at 411 today (05/26/18)   3. HTN -Overall controlled -On Amlodipine and lisinopril-HCTZ    PLAN:  -Feraheme weekly X2 in the next 2-3 weeks  -Lab monthly X4 -F/u in 4 months     No orders of the defined types were placed in this encounter.  All questions were answered. The patient knows to call the clinic with any problems, questions or concerns. No barriers to learning was detected. I spent 20 minutes counseling the patient face to face. The total time spent in the appointment was 25 minutes and more than 50% was on counseling and review of test results     Malachy Mood, MD 05/26/2018 1:13 PM   I, Delphina Cahill, am acting as scribe for Malachy Mood, MD.   I have reviewed the above documentation for accuracy and completeness, and I agree with the above.

## 2018-05-26 ENCOUNTER — Inpatient Hospital Stay: Payer: Federal, State, Local not specified - PPO | Attending: Hematology and Oncology

## 2018-05-26 ENCOUNTER — Encounter: Payer: Self-pay | Admitting: Hematology

## 2018-05-26 ENCOUNTER — Telehealth: Payer: Self-pay | Admitting: Hematology

## 2018-05-26 ENCOUNTER — Inpatient Hospital Stay (HOSPITAL_BASED_OUTPATIENT_CLINIC_OR_DEPARTMENT_OTHER): Payer: Federal, State, Local not specified - PPO | Admitting: Hematology

## 2018-05-26 DIAGNOSIS — D519 Vitamin B12 deficiency anemia, unspecified: Secondary | ICD-10-CM | POA: Insufficient documentation

## 2018-05-26 DIAGNOSIS — E538 Deficiency of other specified B group vitamins: Secondary | ICD-10-CM | POA: Insufficient documentation

## 2018-05-26 DIAGNOSIS — Z9884 Bariatric surgery status: Secondary | ICD-10-CM | POA: Diagnosis not present

## 2018-05-26 DIAGNOSIS — D508 Other iron deficiency anemias: Secondary | ICD-10-CM | POA: Diagnosis not present

## 2018-05-26 DIAGNOSIS — I10 Essential (primary) hypertension: Secondary | ICD-10-CM | POA: Insufficient documentation

## 2018-05-26 DIAGNOSIS — D509 Iron deficiency anemia, unspecified: Secondary | ICD-10-CM | POA: Insufficient documentation

## 2018-05-26 DIAGNOSIS — N920 Excessive and frequent menstruation with regular cycle: Secondary | ICD-10-CM | POA: Diagnosis not present

## 2018-05-26 DIAGNOSIS — Z79899 Other long term (current) drug therapy: Secondary | ICD-10-CM | POA: Diagnosis not present

## 2018-05-26 DIAGNOSIS — D513 Other dietary vitamin B12 deficiency anemia: Secondary | ICD-10-CM

## 2018-05-26 LAB — CMP (CANCER CENTER ONLY)
ALK PHOS: 75 U/L (ref 40–150)
ALT: 7 U/L (ref 0–55)
AST: 11 U/L (ref 5–34)
Albumin: 3.4 g/dL — ABNORMAL LOW (ref 3.5–5.0)
Anion gap: 7 (ref 3–11)
BUN: 13 mg/dL (ref 7–26)
CALCIUM: 8.8 mg/dL (ref 8.4–10.4)
CHLORIDE: 107 mmol/L (ref 98–109)
CO2: 24 mmol/L (ref 22–29)
CREATININE: 0.81 mg/dL (ref 0.60–1.10)
GFR, Estimated: >60 mL/min (ref >60)
Glucose, Bld: 80 mg/dL (ref 70–140)
Potassium: 3.9 mmol/L (ref 3.5–5.1)
SODIUM: 138 mmol/L (ref 136–145)
Total Bilirubin: 0.4 mg/dL (ref 0.2–1.2)
Total Protein: 6.9 g/dL (ref 6.4–8.3)

## 2018-05-26 LAB — CBC WITH DIFFERENTIAL (CANCER CENTER ONLY)
BASOS PCT: 0 %
Basophils Absolute: 0 10*3/uL (ref 0.0–0.1)
EOS ABS: 0.2 10*3/uL (ref 0.0–0.5)
Eosinophils Relative: 4 %
HCT: 40.5 % (ref 34.8–46.6)
HEMOGLOBIN: 13 g/dL (ref 11.6–15.9)
LYMPHS ABS: 1.2 10*3/uL (ref 0.9–3.3)
Lymphocytes Relative: 29 %
MCH: 29.9 pg (ref 25.1–34.0)
MCHC: 32.1 g/dL (ref 31.5–36.0)
MCV: 93.1 fL (ref 79.5–101.0)
MONOS PCT: 11 %
Monocytes Absolute: 0.5 10*3/uL (ref 0.1–0.9)
NEUTROS PCT: 56 %
Neutro Abs: 2.2 10*3/uL (ref 1.5–6.5)
Platelet Count: 318 10*3/uL (ref 145–400)
RBC: 4.35 MIL/uL (ref 3.70–5.45)
RDW: 15.1 % — AB (ref 11.2–14.5)
WBC Count: 4 10*3/uL (ref 3.9–10.3)

## 2018-05-26 LAB — FERRITIN: FERRITIN: 15 ng/mL (ref 9–269)

## 2018-05-26 LAB — IRON AND TIBC
IRON: 63 ug/dL (ref 41–142)
Saturation Ratios: 21 % (ref 21–57)
TIBC: 301 ug/dL (ref 236–444)
UIBC: 238 ug/dL

## 2018-05-26 LAB — VITAMIN B12: Vitamin B-12: 411 pg/mL (ref 180–914)

## 2018-05-26 NOTE — Telephone Encounter (Signed)
Scheduled appt per 6/21 los - gave patient aVS and calender per los.  

## 2018-05-31 LAB — METHYLMALONIC ACID, SERUM: METHYLMALONIC ACID, QUANTITATIVE: 287 nmol/L (ref 0–378)

## 2018-06-01 DIAGNOSIS — E538 Deficiency of other specified B group vitamins: Secondary | ICD-10-CM | POA: Insufficient documentation

## 2018-06-13 ENCOUNTER — Ambulatory Visit: Payer: Federal, State, Local not specified - PPO

## 2018-06-16 ENCOUNTER — Other Ambulatory Visit: Payer: Self-pay | Admitting: Hematology

## 2018-06-20 ENCOUNTER — Inpatient Hospital Stay: Payer: Federal, State, Local not specified - PPO | Attending: Hematology and Oncology

## 2018-06-20 ENCOUNTER — Inpatient Hospital Stay: Payer: Federal, State, Local not specified - PPO

## 2018-06-20 VITALS — BP 129/82 | HR 79 | Temp 98.1°F | Resp 17

## 2018-06-20 DIAGNOSIS — D508 Other iron deficiency anemias: Secondary | ICD-10-CM

## 2018-06-20 LAB — CBC WITH DIFFERENTIAL (CANCER CENTER ONLY)
Basophils Absolute: 0 10*3/uL (ref 0.0–0.1)
Basophils Relative: 1 %
Eosinophils Absolute: 0.2 10*3/uL (ref 0.0–0.5)
Eosinophils Relative: 4 %
HEMATOCRIT: 38 % (ref 34.8–46.6)
HEMOGLOBIN: 12.7 g/dL (ref 11.6–15.9)
LYMPHS ABS: 1.4 10*3/uL (ref 0.9–3.3)
LYMPHS PCT: 31 %
MCH: 30.6 pg (ref 25.1–34.0)
MCHC: 33.3 g/dL (ref 31.5–36.0)
MCV: 91.9 fL (ref 79.5–101.0)
MONO ABS: 0.5 10*3/uL (ref 0.1–0.9)
MONOS PCT: 10 %
NEUTROS ABS: 2.4 10*3/uL (ref 1.5–6.5)
Neutrophils Relative %: 54 %
PLATELETS: 424 10*3/uL — AB (ref 145–400)
RBC: 4.14 MIL/uL (ref 3.70–5.45)
RDW: 14.7 % — AB (ref 11.2–14.5)
WBC Count: 4.5 10*3/uL (ref 3.9–10.3)

## 2018-06-20 LAB — IRON AND TIBC
Iron: 26 ug/dL — ABNORMAL LOW (ref 41–142)
Saturation Ratios: 8 % — ABNORMAL LOW (ref 21–57)
TIBC: 311 ug/dL (ref 236–444)
UIBC: 285 ug/dL

## 2018-06-20 LAB — FERRITIN: FERRITIN: 9 ng/mL — AB (ref 11–307)

## 2018-06-20 MED ORDER — SODIUM CHLORIDE 0.9 % IV SOLN
Freq: Once | INTRAVENOUS | Status: AC
  Administered 2018-06-20: 09:00:00 via INTRAVENOUS

## 2018-06-20 MED ORDER — FERUMOXYTOL INJECTION 510 MG/17 ML
510.0000 mg | Freq: Once | INTRAVENOUS | Status: AC
  Administered 2018-06-20: 510 mg via INTRAVENOUS
  Filled 2018-06-20: qty 17

## 2018-06-20 NOTE — Patient Instructions (Signed)

## 2018-06-21 ENCOUNTER — Telehealth: Payer: Self-pay

## 2018-06-21 NOTE — Telephone Encounter (Signed)
Notified patient per Dr. Mosetta PuttFeng that iron level is still very low, she is scheduling her a iron infusion for next week. Scheduling should be call her with an appointment.  Patient verbalized an understanding.

## 2018-06-21 NOTE — Telephone Encounter (Signed)
-----   Message from Malachy MoodYan Feng, MD sent at 06/21/2018  6:46 AM EDT ----- Please let pt know that her iron level is very low, and I will set up one more iv feraheme next week, thanks  Malachy MoodYan Feng  06/21/2018

## 2018-06-22 ENCOUNTER — Telehealth: Payer: Self-pay | Admitting: Hematology

## 2018-06-22 NOTE — Telephone Encounter (Signed)
Call pt re appts that was added per 7/17 sch msg - left vm for pt re appts.

## 2018-06-26 ENCOUNTER — Encounter: Payer: Self-pay | Admitting: Family Medicine

## 2018-06-26 ENCOUNTER — Other Ambulatory Visit: Payer: Self-pay

## 2018-06-26 ENCOUNTER — Ambulatory Visit: Payer: Federal, State, Local not specified - PPO | Admitting: Family Medicine

## 2018-06-26 VITALS — BP 102/64 | HR 111 | Temp 99.0°F | Resp 16 | Ht 66.73 in | Wt 164.8 lb

## 2018-06-26 DIAGNOSIS — Z9884 Bariatric surgery status: Secondary | ICD-10-CM | POA: Diagnosis not present

## 2018-06-26 DIAGNOSIS — I10 Essential (primary) hypertension: Secondary | ICD-10-CM | POA: Diagnosis not present

## 2018-06-26 DIAGNOSIS — M519 Unspecified thoracic, thoracolumbar and lumbosacral intervertebral disc disorder: Secondary | ICD-10-CM | POA: Diagnosis not present

## 2018-06-26 DIAGNOSIS — M5137 Other intervertebral disc degeneration, lumbosacral region: Secondary | ICD-10-CM

## 2018-06-26 DIAGNOSIS — E538 Deficiency of other specified B group vitamins: Secondary | ICD-10-CM | POA: Diagnosis not present

## 2018-06-26 DIAGNOSIS — M5136 Other intervertebral disc degeneration, lumbar region: Secondary | ICD-10-CM | POA: Diagnosis not present

## 2018-06-26 DIAGNOSIS — F411 Generalized anxiety disorder: Secondary | ICD-10-CM | POA: Diagnosis not present

## 2018-06-26 DIAGNOSIS — D508 Other iron deficiency anemias: Secondary | ICD-10-CM | POA: Diagnosis not present

## 2018-06-26 DIAGNOSIS — M19011 Primary osteoarthritis, right shoulder: Secondary | ICD-10-CM

## 2018-06-26 DIAGNOSIS — F419 Anxiety disorder, unspecified: Secondary | ICD-10-CM | POA: Diagnosis not present

## 2018-06-26 MED ORDER — ALPRAZOLAM 0.5 MG PO TABS: ORAL_TABLET | ORAL | 2 refills | Status: AC

## 2018-06-26 MED ORDER — AMLODIPINE BESYLATE 5 MG PO TABS: 5.0000 mg | ORAL_TABLET | Freq: Every day | ORAL | 1 refills | Status: DC

## 2018-06-26 MED ORDER — MELOXICAM 7.5 MG PO TABS: 7.5000 mg | ORAL_TABLET | Freq: Every day | ORAL | 0 refills | Status: DC

## 2018-06-26 MED ORDER — FOLIC ACID 1 MG PO TABS: 1.0000 mg | ORAL_TABLET | Freq: Every day | ORAL | 1 refills | Status: DC

## 2018-06-26 MED ORDER — CYCLOBENZAPRINE HCL 10 MG PO TABS: 10.0000 mg | ORAL_TABLET | Freq: Three times a day (TID) | ORAL | 1 refills | Status: DC | PRN

## 2018-06-26 MED ORDER — LISINOPRIL-HYDROCHLOROTHIAZIDE 10-12.5 MG PO TABS: ORAL_TABLET | ORAL | 1 refills | Status: DC

## 2018-06-26 MED ORDER — FLUOXETINE HCL 40 MG PO CAPS: 40.0000 mg | ORAL_CAPSULE | Freq: Every day | ORAL | 1 refills | Status: AC

## 2018-06-26 NOTE — Progress Notes (Signed)
Subjective:    Patient ID: Tammy [name], female    DOB: January 02, 1976, 42 y.o.   MRN: [id number]  06/26/2018  Chronic Conditions    HPI This 42 y.o. female presents for THREE MONTH FOLLOW-UP evaluation of R shoulder osteoarthritis, lumbar DDD, hypertension, anxiety.  Referred to orthopedics and underwent injection shoulder; s/p steroid injection with improvement; a lot of improvement.  May consider spinal injection.  Last lumbar spine injections ten years ago.  Does not tolerate injected steroids; angry and jittery and hungry.  Really shaky and weak in R arm; most clients do not want deep tissue.  Not sleeping due to pain/aching.  Patient really wants to avoid surgery if possible for shoulder arthritis.  HTN: Patient reports good compliance with medication, good tolerance to medication, and good symptom control.  Not checking BP at home.  Needs to start.    Anxiety: doing well; no longer needing morning dose of Xanax.  Taking afternoon and evening doses.  Increased Prozac to 36m daily.  Has gained a little weight since last visit and feels secondary to increase Prozac dose.  Does admit to a lot of anxiety around having surgery for shoulder arthritis.  Iron deficiency anemia and vitamin b12 deficiency: Secondary to menorrhagia.  Despite uterine ablation, patient continues to have frequent heavy menses.  Has appointment scheduled gynecology in the upcoming month.  Most likely will request hysterectomy.  Continues to be followed by hematology for iron deficiency and iron infusions as well as vitamin B12 deficiency.  BP Readings from Last 3 Encounters:  06/26/18 102/64  06/20/18 129/82  05/26/18 (!) 134/96   Wt Readings from Last 3 Encounters:  06/26/18 164 lb 12.8 oz (74.8 kg)  05/26/18 168 lb (76.2 kg)  03/29/18 160 lb 9.6 oz (72.8 kg)   Immunization History  Administered Date(s) Administered  . Influenza Split 09/01/2012  . Influenza,inj,Quad PF,6+ Mos 09/05/2013, 09/24/2015  .  Tdap 10/25/2012    Review of Systems  Constitutional: Negative for activity change, appetite change, chills, diaphoresis, fatigue, fever and unexpected weight change.  HENT: Negative for congestion, dental problem, drooling, ear discharge, ear pain, facial swelling, hearing loss, mouth sores, nosebleeds, postnasal drip, rhinorrhea, sinus pressure, sneezing, sore throat, tinnitus, trouble swallowing and voice change.   Eyes: Negative for photophobia, pain, discharge, redness, itching and visual disturbance.  Respiratory: Negative for apnea, cough, choking, chest tightness, shortness of breath, wheezing and stridor.   Cardiovascular: Negative for chest pain, palpitations and leg swelling.  Gastrointestinal: Negative for abdominal distention, abdominal pain, anal bleeding, blood in stool, constipation, diarrhea, nausea, rectal pain and vomiting.  Endocrine: Negative for cold intolerance, heat intolerance, polydipsia, polyphagia and polyuria.  Genitourinary: Negative for decreased urine volume, difficulty urinating, dyspareunia, dysuria, enuresis, flank pain, frequency, genital sores, hematuria, menstrual problem, pelvic pain, urgency, vaginal bleeding, vaginal discharge and vaginal pain.  Musculoskeletal: Positive for arthralgias and back pain. Negative for gait problem, joint swelling, myalgias, neck pain and neck stiffness.  Skin: Negative for color change, pallor, rash and wound.  Allergic/Immunologic: Negative for environmental allergies, food allergies and immunocompromised state.  Neurological: Negative for dizziness, tremors, seizures, syncope, facial asymmetry, speech difficulty, weakness, light-headedness, numbness and headaches.  Hematological: Negative for adenopathy. Does not bruise/bleed easily.  Psychiatric/Behavioral: Positive for sleep disturbance. Negative for agitation, behavioral problems, confusion, decreased concentration, dysphoric mood, hallucinations, self-injury and suicidal  ideas. The patient is nervous/anxious. The patient is not hyperactive.     Past Medical History:  Diagnosis Date  . Allergy   .  Anxiety   . Blood transfusion without reported diagnosis   . Chronic right shoulder pain   . Fe deficiency anemia   . History of seizure disorder   . Hypertension   . Migraines   . SVD (spontaneous vaginal delivery)    x 5   Past Surgical History:  Procedure Laterality Date  . DILITATION & CURRETTAGE/HYSTROSCOPY WITH HYDROTHERMAL ABLATION  10/11/2012   Procedure: DILATATION & CURETTAGE/HYSTEROSCOPY WITH HYDROTHERMAL ABLATION;  Surgeon: Frederico Hamman, MD;  Location: [facility];  Service: Gynecology;  Laterality: N/A;  . 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 GASTRIC OUTLET OBSTRUCTION RELEASE  2005   post gastric bypass  . TUBAL LIGATION  2002   Allergies  Allergen Reactions  . Nsaids Other (See Comments)    Had gastric bypass sx- Pt has a sensitivity to drug Pt states that she can take NSAIDS but should use them in moderation   Current Outpatient Medications on File Prior to Visit  Medication Sig Dispense Refill  . Calcium Carbonate-Vit D-Min (CALCIUM 1200 PO) Take 1 tablet by mouth at bedtime.     . cyanocobalamin (,VITAMIN B-12,) 1000 MCG/ML injection Inject 1 mL (1,000 mcg total) into the muscle every 30 (thirty) days. 1 mL 11  . Multiple Vitamin (MULTIVITAMIN) tablet Take 1 tablet by mouth daily.     No current facility-administered medications on file prior to visit.    Social History   Socioeconomic History  . Marital status: Married    Spouse name: Not on file  . Number of children: 5  . Years of education: some colle  . Highest education level: Not on file  Occupational History  . Occupation: Massage Therapist     Comment: Kneaded Injury   Social Needs  . Financial resource strain: Not on file  . Food insecurity:    Worry: Not on file    Inability: Not on file  . Transportation needs:    Medical: Not  on file    Non-medical: Not on file  Tobacco Use  . Smoking status: Never Smoker  . Smokeless tobacco: Never Used  Substance and Sexual Activity  . Alcohol use: Yes    Comment: rare   . Drug use: No  . Sexual activity: Yes    Partners: Female    Birth control/protection: Surgical  Lifestyle  . Physical activity:    Days per week: Not on file    Minutes per session: Not on file  . Stress: Not on file  Relationships  . Social connections:    Talks on phone: Not on file    Gets together: Not on file    Attends religious service: Not on file    Active member of club or organization: Not on file    Attends meetings of clubs or organizations: Not on file    Relationship status: Not on file  . Intimate partner violence:    Fear of current or ex partner: Not on file    Emotionally abused: Not on file    Physically abused: Not on file    Forced sexual activity: Not on file  Other Topics Concern  . Not on file  Social History Narrative   Marital status: married x 18 years      Children: four children; 1 grandchildren (3)      Lives: with husband, youngest son.      Employment: massage therapist part time in 2019.      Tobacco:  none      Alcohol: none      Exercise: walking 30 minutes 3 times per week.    Family History  Problem Relation Age of Onset  . Hypertension Mother   . Hepatitis Mother   . Hyperthyroidism Mother   . Heart disease Mother   . Stroke Mother   . [place] disease Mother   . Depression Mother   . Obesity Sister   . Hypertension Sister   . Cancer Maternal Grandmother        uterine  . Heart disease Maternal Grandmother   . Hypertension Maternal Grandmother   . Cancer Maternal Grandfather        multiple myeloma  . Hypertension Maternal Grandfather   . Bipolar disorder Father   . Hypertension Father   . Hepatitis Father   . Migraines Daughter        Objective:    BP 102/64   Pulse (!) 111   Temp 99 F (37.2 C) (Oral)   Resp 16   Ht 5'  6.73" (1.695 m)   Wt 164 lb 12.8 oz (74.8 kg)   SpO2 96%   BMI 26.02 kg/m  Physical Exam  Constitutional: She is oriented to person, place, and time. She appears well-developed and well-nourished. No distress.  HENT:  Head: Normocephalic and atraumatic.  Right Ear: External ear normal.  Left Ear: External ear normal.  Nose: Nose normal.  Mouth/Throat: Oropharynx is clear and moist.  Eyes: Pupils are equal, round, and reactive to light. Conjunctivae and EOM are normal.  Neck: Normal range of motion. Neck supple. Carotid bruit is not present. No thyromegaly present.  Cardiovascular: Normal rate, regular rhythm, normal heart sounds and intact distal pulses. Exam reveals no gallop and no friction rub.  No murmur heard. Pulmonary/Chest: Effort normal and breath sounds normal. She has no wheezes. She has no rales.  Abdominal: Soft. Bowel sounds are normal. She exhibits no distension and no mass. There is no tenderness. There is no rebound and no guarding.  Lymphadenopathy:    She has no cervical adenopathy.  Neurological: She is alert and oriented to person, place, and time. No cranial nerve deficit. Coordination normal.  Skin: Skin is warm and dry. No rash noted. She is not diaphoretic. No erythema. No pallor.  Psychiatric: She has a normal mood and affect. Her behavior is normal. Thought content normal.   No results found. Depression screen [facility] 2/9 06/26/2018 02/01/2018 01/23/2018 06/30/2017 07/29/2016  Decreased Interest 0 0 0 0 0  Down, Depressed, Hopeless 0 0 0 0 0  PHQ - 2 Score 0 0 0 0 0   Fall Risk  06/26/2018 02/01/2018 01/23/2018 06/30/2017 07/29/2016  Falls in the past year? No Yes Yes Yes No  Comment - - - - -  Number falls in past yr: - 1 1 - -  Injury with Fall? - - - - -  Comment - - - - -        Assessment & Plan:   1. Degenerative disc disease at L5-S1 level   2. Lumbosacral disc disease   3. Essential hypertension   4. Anxiety   5. Primary osteoarthritis of right  shoulder   6. Generalized anxiety disorder   7. Iron deficiency anemia secondary to inadequate dietary iron intake   8. Folate deficiency   9. Vitamin B12 deficiency   10. History of gastric bypass     Right shoulder osteoarthritis and degenerative disc disease of lumbar spine: Suffers with  chronic pain.  Status post orthopedic consultation.  Patient is delaying surgical revision of left shoulder.  Patient is considering steroid injections in the lumbar spine yet reluctant.  Continue meloxicam therapy and Flexeril therapy at this time.  Limit meloxicam use due to history of gastric bypass.  Hypertension: Well-controlled.  Obtain labs for chronic disease management.  Continue current medications.  Encourage patient to monitor blood pressure closely.  May be able to wean amlodipine therapy in the future.  Generalized anxiety disorder: Improved control with increase Prozac to 40 mg at last visit.  Refill of Xanax 3 daily for the next 3 months.  At her next visit, patient will need to decrease Xanax to twice daily dosing.  Iron deficiency anemia with vitamin B12 deficiency and folic acid deficiency: Followed by hematology for regular iron infusions.  Iron continues to be low and felt to be due to ongoing menses.  Patient has upcoming appointment with gynecology to discuss treatment options.  Continue monthly B12 injections and folic acid supplementation orally.  No orders of the defined types were placed in this encounter.  Meds ordered this encounter  Medications  . cyclobenzaprine (FLEXERIL) 10 MG tablet    Sig: Take 1 tablet (10 mg total) by mouth 3 (three) times daily as needed for muscle spasms.    Dispense:  180 tablet    Refill:  1  . meloxicam (MOBIC) 7.5 MG tablet    Sig: Take 1 tablet (7.5 mg total) by mouth daily.    Dispense:  60 tablet    Refill:  0  . lisinopril-hydrochlorothiazide (PRINZIDE,ZESTORETIC) 10-12.5 MG tablet    Sig: TAKE 1 TABLET BY MOUTH DAILY    Dispense:  90  tablet    Refill:  1  . folic acid (FOLVITE) 1 MG tablet    Sig: Take 1 tablet (1 mg total) by mouth daily.    Dispense:  90 tablet    Refill:  1  . FLUoxetine (PROZAC) 40 MG capsule    Sig: Take 1 capsule (40 mg total) by mouth daily.    Dispense:  90 capsule    Refill:  1  . amLODipine (NORVASC) 5 MG tablet    Sig: Take 1 tablet (5 mg total) by mouth daily.    Dispense:  90 tablet    Refill:  1  . ALPRAZolam (XANAX) 0.5 MG tablet    Sig: 1 AT MIDDAY AND 2 TABLETS AT BEDTIME    Dispense:  90 tablet    Refill:  2    Return in about 3 months (around 09/26/2018) for follow-up chronic medical conditions [place].   Kaycie Pegues Elayne Guerin, M.D. Primary Care at [facility] previously [facility] [address] [place], [place]  [address] [phone] phone [phone] fax

## 2018-06-26 NOTE — Patient Instructions (Signed)
     IF you received an x-ray today, you will receive an invoice from [facility]. Please contact [facility] at [phone] with questions or concerns regarding your invoice.   IF you received labwork today, you will receive an invoice from [facility]. Please contact [facility] at [phone] with questions or concerns regarding your invoice.   Our billing staff will not be able to assist you with questions regarding bills from these companies.  You will be contacted with the lab results as soon as they are available. The fastest way to get your results is to activate your My Chart account. Instructions are located on the last page of this paperwork. If you have not heard from us regarding the results in 2 weeks, please contact this office.     

## 2018-06-28 ENCOUNTER — Inpatient Hospital Stay: Payer: Federal, State, Local not specified - PPO

## 2018-06-28 VITALS — BP 108/70 | HR 98 | Temp 97.9°F | Resp 16

## 2018-06-28 DIAGNOSIS — D508 Other iron deficiency anemias: Secondary | ICD-10-CM

## 2018-06-28 MED ORDER — SODIUM CHLORIDE 0.9 % IV SOLN
510.0000 mg | Freq: Once | INTRAVENOUS | Status: AC
  Administered 2018-06-28: 510 mg via INTRAVENOUS
  Filled 2018-06-28: qty 17

## 2018-06-28 NOTE — Patient Instructions (Signed)

## 2018-07-03 ENCOUNTER — Ambulatory Visit: Payer: Federal, State, Local not specified - PPO | Admitting: Family Medicine

## 2018-07-12 ENCOUNTER — Encounter: Payer: Federal, State, Local not specified - PPO | Admitting: Obstetrics and Gynecology

## 2018-07-13 ENCOUNTER — Other Ambulatory Visit: Payer: Self-pay | Admitting: Family Medicine

## 2018-07-13 DIAGNOSIS — F419 Anxiety disorder, unspecified: Secondary | ICD-10-CM

## 2018-07-14 ENCOUNTER — Other Ambulatory Visit: Payer: Self-pay | Admitting: Family Medicine

## 2018-07-19 ENCOUNTER — Other Ambulatory Visit: Payer: Self-pay | Admitting: Family Medicine

## 2018-07-19 DIAGNOSIS — F419 Anxiety disorder, unspecified: Secondary | ICD-10-CM

## 2018-07-24 ENCOUNTER — Ambulatory Visit: Payer: Federal, State, Local not specified - PPO | Admitting: Family Medicine

## 2018-07-25 ENCOUNTER — Inpatient Hospital Stay: Payer: Federal, State, Local not specified - PPO

## 2018-07-27 ENCOUNTER — Telehealth: Payer: Self-pay | Admitting: Hematology

## 2018-07-27 NOTE — Telephone Encounter (Signed)
LVM for pt regarding upcoming appts per 8/20 sch message.

## 2018-08-22 ENCOUNTER — Inpatient Hospital Stay: Payer: Federal, State, Local not specified - PPO | Attending: Hematology and Oncology

## 2018-08-22 DIAGNOSIS — D508 Other iron deficiency anemias: Secondary | ICD-10-CM | POA: Diagnosis not present

## 2018-08-22 DIAGNOSIS — Z79899 Other long term (current) drug therapy: Secondary | ICD-10-CM | POA: Insufficient documentation

## 2018-08-22 DIAGNOSIS — E538 Deficiency of other specified B group vitamins: Secondary | ICD-10-CM | POA: Insufficient documentation

## 2018-08-22 DIAGNOSIS — I10 Essential (primary) hypertension: Secondary | ICD-10-CM | POA: Insufficient documentation

## 2018-08-22 LAB — CBC WITH DIFFERENTIAL (CANCER CENTER ONLY)
BASOS ABS: 0 10*3/uL (ref 0.0–0.1)
Basophils Relative: 0 %
Eosinophils Absolute: 0.2 10*3/uL (ref 0.0–0.5)
Eosinophils Relative: 2 %
HEMATOCRIT: 44.1 % (ref 34.8–46.6)
HEMOGLOBIN: 14.9 g/dL (ref 11.6–15.9)
Lymphocytes Relative: 21 %
Lymphs Abs: 1.4 10*3/uL (ref 0.9–3.3)
MCH: 30.3 pg (ref 25.1–34.0)
MCHC: 33.8 g/dL (ref 31.5–36.0)
MCV: 89.6 fL (ref 79.5–101.0)
MONO ABS: 0.4 10*3/uL (ref 0.1–0.9)
Monocytes Relative: 7 %
Neutro Abs: 4.4 10*3/uL (ref 1.5–6.5)
Neutrophils Relative %: 70 %
Platelet Count: 397 10*3/uL (ref 145–400)
RBC: 4.92 MIL/uL (ref 3.70–5.45)
RDW: 14.4 % (ref 11.2–14.5)
WBC: 6.4 10*3/uL (ref 3.9–10.3)

## 2018-08-22 LAB — IRON AND TIBC
Iron: 95 ug/dL (ref 41–142)
SATURATION RATIOS: 40 % (ref 21–57)
TIBC: 235 ug/dL — AB (ref 236–444)
UIBC: 140 ug/dL

## 2018-08-22 LAB — FERRITIN: FERRITIN: 360 ng/mL — AB (ref 11–307)

## 2018-09-20 ENCOUNTER — Other Ambulatory Visit: Payer: Federal, State, Local not specified - PPO

## 2018-09-20 ENCOUNTER — Ambulatory Visit: Payer: Federal, State, Local not specified - PPO | Admitting: Hematology

## 2018-12-04 DIAGNOSIS — Z13220 Encounter for screening for lipoid disorders: Secondary | ICD-10-CM | POA: Diagnosis not present

## 2018-12-04 DIAGNOSIS — Z9884 Bariatric surgery status: Secondary | ICD-10-CM | POA: Diagnosis not present

## 2018-12-04 DIAGNOSIS — M5136 Other intervertebral disc degeneration, lumbar region: Secondary | ICD-10-CM | POA: Diagnosis not present

## 2018-12-04 DIAGNOSIS — F419 Anxiety disorder, unspecified: Secondary | ICD-10-CM | POA: Diagnosis not present

## 2018-12-04 DIAGNOSIS — Z23 Encounter for immunization: Secondary | ICD-10-CM | POA: Diagnosis not present

## 2018-12-04 DIAGNOSIS — I10 Essential (primary) hypertension: Secondary | ICD-10-CM | POA: Diagnosis not present

## 2018-12-04 DIAGNOSIS — E538 Deficiency of other specified B group vitamins: Secondary | ICD-10-CM | POA: Diagnosis not present

## 2018-12-04 DIAGNOSIS — M19011 Primary osteoarthritis, right shoulder: Secondary | ICD-10-CM | POA: Diagnosis not present

## 2019-02-06 ENCOUNTER — Ambulatory Visit (INDEPENDENT_AMBULATORY_CARE_PROVIDER_SITE_OTHER): Payer: Federal, State, Local not specified - PPO

## 2019-02-06 ENCOUNTER — Ambulatory Visit: Payer: Federal, State, Local not specified - PPO | Admitting: Emergency Medicine

## 2019-02-06 ENCOUNTER — Other Ambulatory Visit: Payer: Self-pay

## 2019-02-06 ENCOUNTER — Encounter: Payer: Self-pay | Admitting: Emergency Medicine

## 2019-02-06 VITALS — BP 107/76 | HR 117 | Temp 98.5°F | Resp 16 | Ht 65.25 in | Wt 188.2 lb

## 2019-02-06 DIAGNOSIS — G8929 Other chronic pain: Secondary | ICD-10-CM

## 2019-02-06 DIAGNOSIS — M25561 Pain in right knee: Secondary | ICD-10-CM | POA: Diagnosis not present

## 2019-02-06 DIAGNOSIS — M25511 Pain in right shoulder: Secondary | ICD-10-CM

## 2019-02-06 DIAGNOSIS — M19011 Primary osteoarthritis, right shoulder: Secondary | ICD-10-CM

## 2019-02-06 DIAGNOSIS — G43009 Migraine without aura, not intractable, without status migrainosus: Secondary | ICD-10-CM

## 2019-02-06 DIAGNOSIS — Z124 Encounter for screening for malignant neoplasm of cervix: Secondary | ICD-10-CM

## 2019-02-06 MED ORDER — SUMATRIPTAN SUCCINATE 50 MG PO TABS: ORAL_TABLET | ORAL | 0 refills | Status: DC

## 2019-02-06 NOTE — Patient Instructions (Addendum)
   If you have lab work done today you will be contacted with your lab results within the next 2 weeks.  If you have not heard from us then please contact us. The fastest way to get your results is to register for My Chart.   IF you received an x-ray today, you will receive an invoice from [facility]. Please contact [facility] at [phone] with questions or concerns regarding your invoice.   IF you received labwork today, you will receive an invoice from [facility]. Please contact [facility] at [phone] with questions or concerns regarding your invoice.   Our billing staff will not be able to assist you with questions regarding bills from these companies.  You will be contacted with the lab results as soon as they are available. The fastest way to get your results is to activate your My Chart account. Instructions are located on the last page of this paperwork. If you have not heard from us regarding the results in 2 weeks, please contact this office.     Migraine Headache  A migraine headache is a very strong throbbing pain on one side or both sides of your head. Migraines can also cause other symptoms. Talk with your doctor about what things may bring on (trigger) your migraine headaches. Follow these instructions at home: Medicines  Take over-the-counter and prescription medicines only as told by your doctor.  Do not drive or use heavy machinery while taking prescription pain medicine.  To prevent or treat constipation while you are taking prescription pain medicine, your doctor may recommend that you: ? Drink enough fluid to keep your pee (urine) clear or pale yellow. ? Take over-the-counter or prescription medicines. ? Eat foods that are high in fiber. These include fresh fruits and vegetables, whole grains, and beans. ? Limit foods that are high in fat and processed sugars. These include fried and sweet foods. Lifestyle  Avoid alcohol.  Do not use any  products that contain nicotine or tobacco, such as cigarettes and e-cigarettes. If you need help quitting, ask your doctor.  Get at least 8 hours of sleep every night.  Limit your stress. General instructions   Keep a journal to find out what may bring on your migraines. For example, write down: ? What you eat and drink. ? How much sleep you get. ? Any change in what you eat or drink. ? Any change in your medicines.  If you have a migraine: ? Avoid things that make your symptoms worse, such as bright lights. ? It may help to lie down in a dark, quiet room. ? Do not drive or use heavy machinery. ? Ask your doctor what activities are safe for you.  Keep all follow-up visits as told by your doctor. This is important. Contact a doctor if:  You get a migraine that is different or worse than your usual migraines. Get help right away if:  Your migraine gets very bad.  You have a fever.  You have a stiff neck.  You have trouble seeing.  Your muscles feel weak or like you cannot control them.  You start to lose your balance a lot.  You start to have trouble walking.  You pass out (faint). This information is not intended to replace advice given to you by your health care provider. Make sure you discuss any questions you have with your health care provider. Document Released: 08/31/2008 Document Revised: 08/16/2018 Document Reviewed: 05/10/2016 Elsevier Interactive Patient Education  2019 [employer]   Inc[employer]  Shoulder Pain Many things can cause shoulder pain, including:  An injury.  Moving the shoulder in the same way again and again (overuse).  Joint pain (arthritis). Pain can come from:  Swelling and irritation (inflammation) of any part of the shoulder.  An injury to the shoulder joint.  An injury to: ? Tissues that connect muscle to bone (tendons). ? Tissues that connect bones to each other (ligaments). ? Bones. Follow these instructions at home: Watch for changes  in your symptoms. Let your doctor know about them. Follow these instructions to help with your pain. If you have a sling:  Wear the sling as told by your doctor. Remove it only as told by your doctor.  Loosen the sling if your fingers: ? Tingle. ? Become numb. ? Turn cold and blue.  Keep the sling clean.  If the sling is not waterproof: ? Do not let it get wet. ? Take the sling off when you shower or bathe. Managing pain, stiffness, and swelling   If told, put ice on the painful area: ? Put ice in a plastic bag. ? Place a towel between your skin and the bag. ? Leave the ice on for 20 minutes, 2-3 times a day. Stop putting ice on if it does not help with the pain.  Squeeze a soft ball or a foam pad as much as possible. This prevents swelling in the shoulder. It also helps to strengthen the arm. General instructions  Take over-the-counter and prescription medicines only as told by your doctor.  Keep all follow-up visits as told by your doctor. This is important. Contact a doctor if:  Your pain gets worse.  Medicine does not help your pain.  You have new pain in your arm, hand, or fingers. Get help right away if:  Your arm, hand, or fingers: ? Tingle. ? Are numb. ? Are swollen. ? Are painful. ? Turn white or blue. Summary  Shoulder pain can be caused by many things. These include injury, moving the shoulder in the same away again and again, and joint pain.  Watch for changes in your symptoms. Let your doctor know about them.  This condition may be treated with a sling, ice, and pain medicine.  Contact your doctor if the pain gets worse or you have new pain. Get help right away if your arm, hand, or fingers tingle or get numb, swollen, or painful.  Keep all follow-up visits as told by your doctor. This is important. This information is not intended to replace advice given to you by your health care provider. Make sure you discuss any questions you have with your  health care provider. Document Released: 05/10/2008 Document Revised: 06/06/2018 Document Reviewed: 06/06/2018 Elsevier Interactive Patient Education  2019 [employer].

## 2019-02-06 NOTE — Progress Notes (Signed)
Tammy [name] 43 y.o.   Chief Complaint  Patient presents with  . Migraine    x 4 days with nausea and vomiting x   . Arthritis    body pain and RIGHT knee started yesterday    HISTORY OF PRESENT ILLNESS: This is a 43 y.o. female with history of chronic pain to her right shoulder complaining of acute pain to her right knee.  Has a history of osteoarthritis.  Also complaining of intermittent history of chronic migraine headache with nausea and vomiting.  Denies injuries.  Has seen orthopedist and told she needed surgery on her right shoulder.  PCP is Dr. Sonia Baller, has appointment to see her next April.  Also inquiring about FMLA papers.  No other significant symptoms or medical concerns today.  HPI   Prior to Admission medications   Medication Sig Start Date End Date Taking? Authorizing Provider  ALPRAZolam Duanne Moron) 0.5 MG tablet 1 AT MIDDAY AND 2 TABLETS AT BEDTIME 06/26/18  Yes Wardell Honour, MD  amLODipine (NORVASC) 5 MG tablet Take 1 tablet (5 mg total) by mouth daily. 06/26/18  Yes Wardell Honour, MD  Calcium Carbonate-Vit D-Min (CALCIUM 1200 PO) Take 1 tablet by mouth at bedtime.    Yes [provider]  cyanocobalamin (,VITAMIN B-12,) 1000 MCG/ML injection Inject 1 mL (1,000 mcg total) into the muscle every 30 (thirty) days. 04/04/18  Yes Wardell Honour, MD  cyclobenzaprine (FLEXERIL) 10 MG tablet Take 1 tablet (10 mg total) by mouth 3 (three) times daily as needed for muscle spasms. 06/26/18  Yes Wardell Honour, MD  FLUoxetine (PROZAC) 40 MG capsule Take 1 capsule (40 mg total) by mouth daily. 06/26/18  Yes Wardell Honour, MD  folic acid (FOLVITE) 1 MG tablet Take 1 tablet (1 mg total) by mouth daily. 06/26/18  Yes Wardell Honour, MD  lisinopril-hydrochlorothiazide (PRINZIDE,ZESTORETIC) 10-12.5 MG tablet TAKE 1 TABLET BY MOUTH DAILY 06/26/18  Yes Wardell Honour, MD  Multiple Vitamin (MULTIVITAMIN) tablet Take 1 tablet by mouth daily.   Yes [provider]    meloxicam (MOBIC) 7.5 MG tablet Take 1 tablet (7.5 mg total) by mouth daily. Patient not taking: Reported on 02/06/2019 06/26/18   Wardell Honour, MD    Allergies  Allergen Reactions  . Nsaids Other (See Comments)    Had gastric bypass sx- Pt has a sensitivity to drug Pt states that she can take NSAIDS but should use them in moderation    Patient Active Problem List   Diagnosis Date Noted  . 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 Vitamin B12 deficiency 06/01/2018  . Iron deficiency anemia 05/26/2018  . 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 Hypertension 01/25/2018  . Hx of seizure disorder 01/25/2018  . Anxiety disorder 01/25/2018  . History of gastric bypass 01/25/2018  . Fe deficiency anemia 01/25/2018  . Chronic headache 01/25/2018  . Chronic right shoulder pain 01/25/2018  . Degenerative disc disease at L5-S1 level 07/23/2012    Past Medical History:  Diagnosis Date  . Allergy   . Anxiety   . Blood transfusion without reported diagnosis   . Chronic right shoulder pain   . Fe deficiency anemia   . History of seizure disorder   . Hypertension   . Migraines   . SVD (spontaneous vaginal delivery)    x 5    Past Surgical History:  Procedure Laterality Date  . DILITATION & CURRETTAGE/HYSTROSCOPY WITH HYDROTHERMAL ABLATION  10/11/2012   Procedure: DILATATION &  CURETTAGE/HYSTEROSCOPY WITH HYDROTHERMAL ABLATION;  Surgeon: Frederico Hamman, MD;  Location: [facility];  Service: Gynecology;  Laterality: N/A;  . 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 GASTRIC OUTLET OBSTRUCTION RELEASE  2005   post gastric bypass  . TUBAL LIGATION  2002    Social History   Socioeconomic History  . Marital status: Married    Spouse name: Not on file  . Number of children: 5  . Years of education: some colle  . Highest education level: Not on file  Occupational History  . Occupation: Massage Therapist     Comment: Kneaded Injury   Social Needs  .  Financial resource strain: Not on file  . Food insecurity:    Worry: Not on file    Inability: Not on file  . Transportation needs:    Medical: Not on file    Non-medical: Not on file  Tobacco Use  . Smoking status: Never Smoker  . Smokeless tobacco: Never Used  Substance and Sexual Activity  . Alcohol use: Yes    Comment: rare   . Drug use: No  . Sexual activity: Yes    Partners: Female    Birth control/protection: Surgical  Lifestyle  . Physical activity:    Days per week: Not on file    Minutes per session: Not on file  . Stress: Not on file  Relationships  . Social connections:    Talks on phone: Not on file    Gets together: Not on file    Attends religious service: Not on file    Active member of club or organization: Not on file    Attends meetings of clubs or organizations: Not on file    Relationship status: Not on file  . Intimate partner violence:    Fear of current or ex partner: Not on file    Emotionally abused: Not on file    Physically abused: Not on file    Forced sexual activity: Not on file  Other Topics Concern  . Not on file  Social History Narrative   Marital status: married x 18 years      Children: four children; 1 grandchildren (3)      Lives: with husband, youngest son.      Employment: massage therapist part time in 2019.      Tobacco: none      Alcohol: none      Exercise: walking 30 minutes 3 times per week.     Family History  Problem Relation Age of Onset  . Hypertension Mother   . Hepatitis Mother   . Hyperthyroidism Mother   . Heart disease Mother   . Stroke Mother   . [place] disease Mother   . Depression Mother   . Obesity Sister   . Hypertension Sister   . Cancer Maternal Grandmother        uterine  . Heart disease Maternal Grandmother   . Hypertension Maternal Grandmother   . Cancer Maternal Grandfather        multiple myeloma  . Hypertension Maternal Grandfather   . Bipolar disorder Father   . Hypertension Father    . Hepatitis Father   . Migraines Daughter      Review of Systems  Constitutional: Negative.  Negative for chills and fever.  HENT: Negative for congestion and sore throat.   Eyes: Negative.  Negative for blurred vision and double vision.  Respiratory: Negative.  Negative for cough and shortness  of breath.   Cardiovascular: Negative.  Negative for chest pain and palpitations.  Gastrointestinal: Negative.  Negative for abdominal pain, blood in stool, diarrhea, melena, nausea and vomiting.  Genitourinary: Negative.   Musculoskeletal: Positive for joint pain.  Skin: Negative.  Negative for rash.  Neurological: Positive for headaches.  Endo/Heme/Allergies: Negative.   All other systems reviewed and are negative.   Vitals:   02/06/19 0859  BP: 107/76  Pulse: (!) 117  Resp: 16  Temp: 98.5 F (36.9 C)  SpO2: 98%    Physical Exam Vitals signs reviewed.  Constitutional:      Appearance: Normal appearance.  HENT:     Head: Normocephalic and atraumatic.     Mouth/Throat:     Mouth: Mucous membranes are moist.     Pharynx: Oropharynx is clear.  Eyes:     Extraocular Movements: Extraocular movements intact.     Conjunctiva/sclera: Conjunctivae normal.     Pupils: Pupils are equal, round, and reactive to light.  Neck:     Musculoskeletal: Normal range of motion and neck supple.  Cardiovascular:     Rate and Rhythm: Normal rate and regular rhythm.     Heart sounds: Normal heart sounds.  Pulmonary:     Effort: Pulmonary effort is normal.     Breath sounds: Normal breath sounds.  Musculoskeletal:     Comments: Right shoulder: No erythema or bruising.  No swelling or tenderness.  Range of motion decreased by 50% Right knee: No erythema or bruising.  Positive lateral tenderness.  Full range of motion.  Stable in flexion and extension.  Skin:    General: Skin is warm and dry.     Capillary Refill: Capillary refill takes less than 2 seconds.  Neurological:     General: No focal  deficit present.     Mental Status: She is alert and oriented to person, place, and time.     Sensory: No sensory deficit.     Motor: No weakness.     Coordination: Coordination normal.     Gait: Gait normal.  Psychiatric:        Mood and Affect: Mood normal.        Behavior: Behavior normal.    Dg Shoulder Right  Result Date: 02/06/2019 CLINICAL DATA:  Chronic right shoulder pain no known injury. EXAM: RIGHT SHOULDER - 2+ VIEW COMPARISON:  Plain films of the right shoulder 01/23/2018. FINDINGS: There is no acute bony or joint abnormality. Advanced glenohumeral osteoarthritis has progressed since the prior examination with increased osteophytosis and subchondral sclerosis. There is bone-on-bone joint space narrowing. Soft tissues are unremarkable. IMPRESSION: Markedly advanced for age glenohumeral osteoarthritis has worsened since the study approximately 1 year ago. No acute abnormality. Electronically Signed   By: Inge Rise M.D.   On: 02/06/2019 09:55   Dg Knee Complete 4 Views Right  Result Date: 02/06/2019 CLINICAL DATA:  Acute right knee pain. EXAM: RIGHT KNEE - COMPLETE 4+ VIEW COMPARISON:  None. FINDINGS: No fracture or bone lesion. Knee joint is normally spaced and aligned.  No joint effusion. Soft tissues are unremarkable. IMPRESSION: Negative. Electronically Signed   By: Lajean Manes M.D.   On: 02/06/2019 09:54     ASSESSMENT & PLAN: Rhandi was seen today for migraine and arthritis.  Diagnoses and all orders for this visit:  Migraine without aura and without status migrainosus, not intractable -     SUMAtriptan (IMITREX) 50 MG tablet; Take 50 mg at the onset of headache; may repeat every  2 hours but no more than 200 mg/24 hours.  Cervical cancer screening -     Ambulatory referral to Obstetrics / Gynecology  Chronic right shoulder pain -     DG Shoulder Right; Future  Acute pain of right knee -     DG Knee Complete 4 Views Right; Future  Osteoarthritis of right  shoulder, unspecified osteoarthritis type    Patient Instructions       If you have lab work done today you will be contacted with your lab results within the next 2 weeks.  If you have not heard from [place] then please contact us. The fastest way to get your results is to register for My Chart.   IF you received an x-ray today, you will receive an invoice from [facility]. Please contact [facility] at [phone] with questions or concerns regarding your invoice.   IF you received labwork today, you will receive an invoice from [facility]. Please contact [facility] at [phone] with questions or concerns regarding your invoice.   Our billing staff will not be able to assist you with questions regarding bills from these companies.  You will be contacted with the lab results as soon as they are available. The fastest way to get your results is to activate your My Chart account. Instructions are located on the last page of this paperwork. If you have not heard from [place] regarding the results in 2 weeks, please contact this office.     Migraine Headache  A migraine headache is a very strong throbbing pain on one side or both sides of your head. Migraines can also cause other symptoms. Talk with your doctor about what things may bring on (trigger) your migraine headaches. Follow these instructions at home: Medicines  Take over-the-counter and prescription medicines only as told by your doctor.  Do not drive or use heavy machinery while taking prescription pain medicine.  To prevent or treat constipation while you are taking prescription pain medicine, your doctor may recommend that you: ? Drink enough fluid to keep your pee (urine) clear or pale yellow. ? Take over-the-counter or prescription medicines. ? Eat foods that are high in fiber. These include fresh fruits and vegetables, whole grains, and beans. ? Limit foods that are high in fat and processed sugars. These  include fried and sweet foods. Lifestyle  Avoid alcohol.  Do not use any products that contain nicotine or tobacco, such as cigarettes and e-cigarettes. If you need help quitting, ask your doctor.  Get at least 8 hours of sleep every night.  Limit your stress. General instructions   Keep a journal to find out what may bring on your migraines. For example, write down: ? What you eat and drink. ? How much sleep you get. ? Any change in what you eat or drink. ? Any change in your medicines.  If you have a migraine: ? Avoid things that make your symptoms worse, such as bright lights. ? It may help to lie down in a dark, quiet room. ? Do not drive or use heavy machinery. ? Ask your doctor what activities are safe for you.  Keep all follow-up visits as told by your doctor. This is important. Contact a doctor if:  You get a migraine that is different or worse than your usual migraines. Get help right away if:  Your migraine gets very bad.  You have a fever.  You have a stiff neck.  You have trouble seeing.  Your muscles feel  weak or like you cannot control them.  You start to lose your balance a lot.  You start to have trouble walking.  You pass out (faint). This information is not intended to replace advice given to you by your health care provider. Make sure you discuss any questions you have with your health care provider. Document Released: 08/31/2008 Document Revised: 08/16/2018 Document Reviewed: 05/10/2016 Elsevier Interactive Patient Education  2019 [place].  Shoulder Pain Many things can cause shoulder pain, including:  An injury.  Moving the shoulder in the same way again and again (overuse).  Joint pain (arthritis). Pain can come from:  Swelling and irritation (inflammation) of any part of the shoulder.  An injury to the shoulder joint.  An injury to: ? Tissues that connect muscle to bone (tendons). ? Tissues that connect bones to each other  (ligaments). ? Bones. Follow these instructions at home: Watch for changes in your symptoms. Let your doctor know about them. Follow these instructions to help with your pain. If you have a sling:  Wear the sling as told by your doctor. Remove it only as told by your doctor.  Loosen the sling if your fingers: ? Tingle. ? Become numb. ? Turn cold and blue.  Keep the sling clean.  If the sling is not waterproof: ? Do not let it get wet. ? Take the sling off when you shower or bathe. Managing pain, stiffness, and swelling   If told, put ice on the painful area: ? Put ice in a plastic bag. ? Place a towel between your skin and the bag. ? Leave the ice on for 20 minutes, 2-3 times a day. Stop putting ice on if it does not help with the pain.  Squeeze a soft ball or a foam pad as much as possible. This prevents swelling in the shoulder. It also helps to strengthen the arm. General instructions  Take over-the-counter and prescription medicines only as told by your doctor.  Keep all follow-up visits as told by your doctor. This is important. Contact a doctor if:  Your pain gets worse.  Medicine does not help your pain.  You have new pain in your arm, hand, or fingers. Get help right away if:  Your arm, hand, or fingers: ? Tingle. ? Are numb. ? Are swollen. ? Are painful. ? Turn white or blue. Summary  Shoulder pain can be caused by many things. These include injury, moving the shoulder in the same away again and again, and joint pain.  Watch for changes in your symptoms. Let your doctor know about them.  This condition may be treated with a sling, ice, and pain medicine.  Contact your doctor if the pain gets worse or you have new pain. Get help right away if your arm, hand, or fingers tingle or get numb, swollen, or painful.  Keep all follow-up visits as told by your doctor. This is important. This information is not intended to replace advice given to you by your  health care provider. Make sure you discuss any questions you have with your health care provider. Document Released: 05/10/2008 Document Revised: 06/06/2018 Document Reviewed: 06/06/2018 Elsevier Interactive Patient Education  2019 [employer] Inc.      Agustina Caroli, MD [facility]

## 2019-02-11 ENCOUNTER — Other Ambulatory Visit: Payer: Self-pay

## 2019-02-11 ENCOUNTER — Encounter (HOSPITAL_COMMUNITY): Payer: Self-pay | Admitting: Emergency Medicine

## 2019-02-11 ENCOUNTER — Emergency Department (HOSPITAL_COMMUNITY)
Admission: EM | Admit: 2019-02-11 | Discharge: 2019-02-11 | Disposition: A | Discharge status: Home / Self Care | Payer: Federal, State, Local not specified - PPO | Attending: Emergency Medicine | Admitting: Emergency Medicine

## 2019-02-11 DIAGNOSIS — G8929 Other chronic pain: Secondary | ICD-10-CM | POA: Diagnosis not present

## 2019-02-11 DIAGNOSIS — I10 Essential (primary) hypertension: Secondary | ICD-10-CM | POA: Insufficient documentation

## 2019-02-11 DIAGNOSIS — Z79899 Other long term (current) drug therapy: Secondary | ICD-10-CM | POA: Insufficient documentation

## 2019-02-11 DIAGNOSIS — M25511 Pain in right shoulder: Secondary | ICD-10-CM | POA: Diagnosis not present

## 2019-02-11 MED ORDER — LIDOCAINE 5 % EX PTCH: 1.0000 | MEDICATED_PATCH | CUTANEOUS | 0 refills | Status: DC

## 2019-02-11 MED ORDER — KETOROLAC TROMETHAMINE 15 MG/ML IJ SOLN
15.0000 mg | Freq: Once | INTRAMUSCULAR | Status: AC
  Administered 2019-02-11: 15 mg via INTRAMUSCULAR
  Filled 2019-02-11: qty 1

## 2019-02-11 MED ORDER — LIDOCAINE 5 % EX PTCH
1.0000 | MEDICATED_PATCH | CUTANEOUS | Status: DC
  Administered 2019-02-11: 1 via TRANSDERMAL
  Filled 2019-02-11: qty 1

## 2019-02-11 NOTE — ED Notes (Signed)
ED Provider at bedside. 

## 2019-02-11 NOTE — Discharge Instructions (Signed)
Evaluated today for right shoulder pain.  I have prescribed lidocaine patches as well as a Toradol injection in the department.  You will need to place one to your right shoulder for 12 hours and then remove for 12 hours before he can place an additional patch.  You must be 12 hours patch free before you can place an additional patch.  I have referred you to orthopedics.  Their contact information is listed in your discharge paperwork.  Please call them to schedule an appointment.  Return to the ED for any worsening symptoms.

## 2019-02-11 NOTE — ED Provider Notes (Signed)
[facility]PART[facility] Provider Note   CSN: [id number] Arrival date & time: 02/11/19  1434  History   Chief Complaint Chief Complaint  Patient presents with  . Shoulder Pain   HPI Tammy [name] is a 43 y.o. female with past medical history significant for chronic back pain chronic right shoulder pain, hypertension, migraines who presents for evaluation of right shoulder pain.  Patient states she has had right shoulder pain x18 months.  States she was seen by orthopedics approximately 1 year ago, [facility] who told her she had "bone on bone".  Patient states they suggested she have shoulder placement surgery at that time.  Patient states she was not willing to have surgery at that time as she stated the pain is not quite severe enough."  Patient states she did a round Mobic which greatly improved her pain.  Patient states however after completion of low back her right shoulder pain resolved.  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  Patient states she changed occupations and now works a Designer, television/film set job."  Patient states she does repetitive motions and she is on the computer for long periods of time.  Patient states she was seen by her PCP approximately 4 days PTA.  Had imaging done of her shoulder which showed severe worsening of her osteoarthritis in her right shoulder.  Patient states she did not want to return to [facility].  States that she was not referred to an additional orthopedist by her PCP.  Was not given any medications for her pain at that time.  Patient states pain is located to anterior and lateral portion of her right shoulder.  Describes as an aching sensation.  States she has had decreased range of motion and is unable to raise her arm above her shoulder secondary to pain.  Patient states she is been unable to raise her arm to even brush her hair x1 year.  Denies radiation of pain.  Has been taking  Flexeril for her pain.  States this mildly relieves her pain, however returns 4 hours after she takes the medication.  It is constant in nature.  Denies additional aggravating or alleviating factors.  Denies fever, chills, nausea, vomiting, numbness or tingling in her extremities, swelling, redness, warmth, bruising to her extremities.  No recent injuries or trauma.  History obtained from patient.  No interpreter was used.     HPI  Past Medical History:  Diagnosis Date  . Allergy   . Anxiety   . Blood transfusion without reported diagnosis   . Chronic right shoulder pain   . Fe deficiency anemia   . History of seizure disorder   . Hypertension   . Migraines   . SVD (spontaneous vaginal delivery)    x 5    Patient Active Problem List   Diagnosis Date Noted  . 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 Vitamin B12 deficiency 06/01/2018  . Iron deficiency anemia 05/26/2018  . 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 Hypertension 01/25/2018  . Hx of seizure disorder 01/25/2018  . Anxiety disorder 01/25/2018  . History of gastric bypass 01/25/2018  . Fe deficiency anemia 01/25/2018  . Chronic headache 01/25/2018  . Chronic right shoulder pain 01/25/2018  . Degenerative disc disease at L5-S1 level 07/23/2012    Past Surgical History:  Procedure Laterality Date  . DILITATION & CURRETTAGE/HYSTROSCOPY WITH HYDROTHERMAL ABLATION  10/11/2012   Procedure: DILATATION & CURETTAGE/HYSTEROSCOPY WITH HYDROTHERMAL ABLATION;  Surgeon: Frederico Hamman, MD;  Location: [facility];  Service: Gynecology;  Laterality: N/A;  . 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 GASTRIC OUTLET OBSTRUCTION RELEASE  2005   post gastric bypass  . TUBAL LIGATION  2002     OB History    Gravida  5   Para  5   Term  5   Preterm      AB      Living  5     SAB      TAB      Ectopic      Multiple      Live Births               Home Medications    Prior to  Admission medications   Medication Sig Start Date End Date Taking? Authorizing Provider  ALPRAZolam Duanne Moron) 0.5 MG tablet 1 AT MIDDAY AND 2 TABLETS AT BEDTIME 06/26/18   Wardell Honour, MD  amLODipine (NORVASC) 5 MG tablet Take 1 tablet (5 mg total) by mouth daily. 06/26/18   Wardell Honour, MD  Calcium Carbonate-Vit D-Min (CALCIUM 1200 PO) Take 1 tablet by mouth at bedtime.     [provider]  cyanocobalamin (,VITAMIN B-12,) 1000 MCG/ML injection Inject 1 mL (1,000 mcg total) into the muscle every 30 (thirty) days. 04/04/18   Wardell Honour, MD  cyclobenzaprine (FLEXERIL) 10 MG tablet Take 1 tablet (10 mg total) by mouth 3 (three) times daily as needed for muscle spasms. 06/26/18   Wardell Honour, MD  FLUoxetine (PROZAC) 40 MG capsule Take 1 capsule (40 mg total) by mouth daily. 06/26/18   Wardell Honour, MD  folic acid (FOLVITE) 1 MG tablet Take 1 tablet (1 mg total) by mouth daily. 06/26/18   Wardell Honour, MD  lidocaine (LIDODERM) 5 % Place 1 patch onto the skin daily. Remove & Discard patch within 12 hours or as directed by MD 02/11/19   Ginelle Bays A, PA-C  lisinopril-hydrochlorothiazide (PRINZIDE,ZESTORETIC) 10-12.5 MG tablet TAKE 1 TABLET BY MOUTH DAILY 06/26/18   Wardell Honour, MD  meloxicam (MOBIC) 7.5 MG tablet Take 1 tablet (7.5 mg total) by mouth daily. Patient not taking: Reported on 02/06/2019 06/26/18   Wardell Honour, MD  Multiple Vitamin (MULTIVITAMIN) tablet Take 1 tablet by mouth daily.    [provider]  SUMAtriptan (IMITREX) 50 MG tablet Take 50 mg at the onset of headache; may repeat every 2 hours but no more than 200 mg/24 hours. 02/06/19   Horald Pollen, MD    Family History Family History  Problem Relation Age of Onset  . Hypertension Mother   . Hepatitis Mother   . Hyperthyroidism Mother   . Heart disease Mother   . Stroke Mother   . Moyamoya disease Mother   . Depression Mother   . Obesity Sister   . Hypertension Sister   . Cancer  Maternal Grandmother        uterine  . Heart disease Maternal Grandmother   . Hypertension Maternal Grandmother   . Cancer Maternal Grandfather        multiple myeloma  . Hypertension Maternal Grandfather   . Bipolar disorder Father   . Hypertension Father   . Hepatitis Father   . Migraines Daughter     Social History Social History   Tobacco Use  . Smoking status: Never Smoker  . Smokeless tobacco: Never Used  Substance Use Topics  .  Alcohol use: Yes    Comment: rare   . Drug use: No     Allergies   Nsaids   Review of Systems Review of Systems  Constitutional: Negative.   HENT: Negative.   Respiratory: Negative.   Cardiovascular: Negative.   Gastrointestinal: Negative.   Genitourinary: Negative.   Musculoskeletal:       Right shoulder pain.  Skin: Negative.   Neurological: Negative.   All other systems reviewed and are negative.    Physical Exam Updated Vital Signs BP 116/85 (BP Location: Right Arm)   Pulse 70   Temp 98.9 F (37.2 C) (Oral)   Resp 17   Ht 5' 6"  (1.676 m)   Wt 83.9 kg   SpO2 99%   BMI 29.86 kg/m   Physical Exam Vitals signs and nursing note reviewed.  Constitutional:      General: She is not in acute distress.    Appearance: She is well-developed. She is not ill-appearing or toxic-appearing.     Comments: Sitting in bed texting on phone does not appear in any acute distress.  HENT:     Head: Normocephalic and atraumatic.     Nose: Nose normal.     Mouth/Throat:     Mouth: Mucous membranes are moist.     Pharynx: Oropharynx is clear.  Eyes:     Pupils: Pupils are equal, round, and reactive to light.  Neck:     Musculoskeletal: Normal range of motion.     Comments: No neck stiffness or neck rigidity.  No meningismus.  No cervical lymphadenopathy. Cardiovascular:     Rate and Rhythm: Normal rate.     Pulses: Normal pulses.     Heart sounds: Normal heart sounds.     Comments: 2+ radial pulses bilaterally. Pulmonary:      Effort: Pulmonary effort is normal. No respiratory distress.     Breath sounds: Normal breath sounds.     Comments: Clear to auscultation bilateral without wheeze, rhonchi or rales. Abdominal:     General: There is no distension.     Comments: Soft, nontender without rebound or guarding.  Musculoskeletal:     Right shoulder: She exhibits decreased range of motion, tenderness and swelling. She exhibits no effusion, no crepitus, no deformity, no laceration, no pain, no spasm and normal pulse.     Left shoulder: Normal.     Right elbow: Normal.    Left elbow: Normal.     Right wrist: Normal.     Cervical back: Normal.     Thoracic back: Normal.     Right upper arm: Normal.     Right forearm: Normal.     Right hand: Normal.     Comments: Tenderness palpation over anterior and lateral portion of right shoulder.  Unable to raise arm above shoulder secondary to pain with active ROM. Full passive ROM however with pain to ROM on right shoulder.  Positive Hawkins and empty can test.  No tenderness over trapezius muscle.  No midline cervical/thoracic spinal tenderness.  No paraspinal tenderness.  Full range of motion elbow and wrist bilateral upper extremities.  No edema.  Skin:    General: Skin is warm and dry.     Comments: No edema, erythema, ecchymosis or warmth.  No rashes or lesions.  Brisk capillary refill.  Neurological:     General: No focal deficit present.     Mental Status: She is alert.     Sensory: Sensation is intact.     Motor:  Motor function is intact.     Coordination: Coordination is intact.     Gait: Gait is intact.     Deep Tendon Reflexes: Reflexes are normal and symmetric.     Reflex Scores:      Bicep reflexes are 2+ on the right side and 2+ on the left side.      Brachioradialis reflexes are 2+ on the right side and 2+ on the left side.    Comments: 5/5 strength to left upper extremity.  5/5 strength to elbow and wrist.  Intact sensation to sharp dull bilateral upper  extremity.    ED Treatments / Results  Labs (all labs ordered are listed, but only abnormal results are displayed) Labs Reviewed - No data to display  EKG None  Radiology No results found.  Procedures Procedures (including critical care time)  Medications Ordered in ED Medications  lidocaine (LIDODERM) 5 % 1 patch (1 patch Transdermal Patch Applied 02/11/19 1603)  ketorolac (TORADOL) 15 MG/ML injection 15 mg (15 mg Intramuscular Given 02/11/19 1600)     Initial Impression / Assessment and Plan / ED Course  I have reviewed the triage vital signs and the nursing notes.  Pertinent labs & imaging results that were available during my care of the patient were reviewed by me and considered in my medical decision making (see chart for details).  43 year old female appears otherwise well presents for evaluation of right shoulder pain.  Afebrile, nonseptic, non-ill-appearing.  History of chronic shoulder pain x18 months.  Seen by orthopedics however does not wish to return orthopedics that she was previously seen.  History of glenohumeral severe osteoarthritis.  Was told previously she needs shoulder replacement.  No recent injuries or trauma.  Left Upper extremity full range of motion.  Limited active range of motion particularly with overhead motions to right shoulder secondary to pain.  Full passive range of motion to right upper extremity.  Normal musculoskeletal exam to right elbow and wrist.  Positive Hawkins and empty can test to right.  Neurovascularly intact.  No evidence of nerve impingement or nerve palsy.  Possible rotator cuff tear vs worsening of known osteoarthritis in right shoulder.  Had imaging done 4 days PTA.  No change in symptoms and no trauma since repeated imaging. Discussed repeat imaging. Shared decision making and deferred re-imaging at this time. Will refer to Outpatient orthopedics for reevaluation. I have placed lidocaine patches as well as give Toradol injection in ED.   Unable to take p.o. NSAIDs secondary to gastric bypass surgery.  Low suspicion for septic joint, gout, hemarthrosis, acute fracture, dislocation, DVT, nerve palsy, bursitis causing patient's symptoms.  Patient hemodynamically stable and appropriate for DC home at this time.  I discussed strict return precautions with patient.  Patient voices understanding and is agreeable for follow-up.     Final Clinical Impressions(s) / ED Diagnoses   Final diagnoses:  Chronic right shoulder pain    ED Discharge Orders         Ordered    lidocaine (LIDODERM) 5 %  Every 24 hours     02/11/19 1602           Avary Pitsenbarger A, PA-C 02/11/19 1649    Dorie Rank, MD 02/13/19 1645

## 2019-02-11 NOTE — ED Triage Notes (Signed)
Pt presents with increased right shoulder pain, hx arthritis in shoulder and degenerative disc disease. Pt reports that she has been unable to get into her PCP's office and didn't like the ortho she was referred to. Reports pain is triggering her migraines. Last migraine was Thursday.

## 2019-02-21 DIAGNOSIS — M19011 Primary osteoarthritis, right shoulder: Secondary | ICD-10-CM | POA: Diagnosis not present

## 2019-02-26 ENCOUNTER — Telehealth: Payer: Self-pay | Admitting: *Deleted

## 2019-02-26 NOTE — Telephone Encounter (Signed)
Copied from CRM [id number]. Topic: General - Inquiry >> Feb 26, 2019  9:05 AM Retta Diones wrote: Reason for CRM: pt called needing an appointment for back pain. Unable to schedule

## 2019-04-04 DIAGNOSIS — M19011 Primary osteoarthritis, right shoulder: Secondary | ICD-10-CM | POA: Diagnosis not present

## 2019-05-02 DIAGNOSIS — M19011 Primary osteoarthritis, right shoulder: Secondary | ICD-10-CM | POA: Diagnosis not present

## 2019-05-25 DIAGNOSIS — Z1231 Encounter for screening mammogram for malignant neoplasm of breast: Secondary | ICD-10-CM | POA: Diagnosis not present

## 2019-05-25 DIAGNOSIS — F419 Anxiety disorder, unspecified: Secondary | ICD-10-CM | POA: Diagnosis not present

## 2019-05-25 DIAGNOSIS — I10 Essential (primary) hypertension: Secondary | ICD-10-CM | POA: Diagnosis not present

## 2019-05-25 DIAGNOSIS — Z9884 Bariatric surgery status: Secondary | ICD-10-CM | POA: Diagnosis not present

## 2019-05-25 DIAGNOSIS — Z13220 Encounter for screening for lipoid disorders: Secondary | ICD-10-CM | POA: Diagnosis not present

## 2019-05-25 DIAGNOSIS — M5136 Other intervertebral disc degeneration, lumbar region: Secondary | ICD-10-CM | POA: Diagnosis not present

## 2019-05-25 DIAGNOSIS — E538 Deficiency of other specified B group vitamins: Secondary | ICD-10-CM | POA: Diagnosis not present

## 2019-05-25 DIAGNOSIS — Z0000 Encounter for general adult medical examination without abnormal findings: Secondary | ICD-10-CM | POA: Diagnosis not present

## 2019-05-25 DIAGNOSIS — Z131 Encounter for screening for diabetes mellitus: Secondary | ICD-10-CM | POA: Diagnosis not present

## 2019-05-25 DIAGNOSIS — Z124 Encounter for screening for malignant neoplasm of cervix: Secondary | ICD-10-CM | POA: Diagnosis not present

## 2019-05-25 DIAGNOSIS — D508 Other iron deficiency anemias: Secondary | ICD-10-CM | POA: Diagnosis not present

## 2019-05-30 DIAGNOSIS — M19011 Primary osteoarthritis, right shoulder: Secondary | ICD-10-CM | POA: Diagnosis not present

## 2019-05-30 DIAGNOSIS — M545 Low back pain: Secondary | ICD-10-CM | POA: Diagnosis not present

## 2019-06-01 ENCOUNTER — Other Ambulatory Visit: Payer: Self-pay | Admitting: Family Medicine

## 2019-06-01 DIAGNOSIS — Z1231 Encounter for screening mammogram for malignant neoplasm of breast: Secondary | ICD-10-CM

## 2019-06-04 DIAGNOSIS — M545 Low back pain: Secondary | ICD-10-CM | POA: Diagnosis not present

## 2019-06-04 DIAGNOSIS — M5136 Other intervertebral disc degeneration, lumbar region: Secondary | ICD-10-CM | POA: Diagnosis not present

## 2019-06-04 DIAGNOSIS — M5416 Radiculopathy, lumbar region: Secondary | ICD-10-CM | POA: Diagnosis not present

## 2019-06-12 DIAGNOSIS — M545 Low back pain: Secondary | ICD-10-CM | POA: Diagnosis not present

## 2019-06-15 ENCOUNTER — Ambulatory Visit: Payer: Federal, State, Local not specified - PPO | Admitting: Women's Health

## 2019-06-21 ENCOUNTER — Encounter: Payer: Self-pay | Admitting: Medical

## 2019-06-21 ENCOUNTER — Ambulatory Visit: Payer: Federal, State, Local not specified - PPO | Admitting: Medical

## 2019-06-25 ENCOUNTER — Other Ambulatory Visit: Payer: Self-pay | Admitting: Emergency Medicine

## 2019-06-25 DIAGNOSIS — G43009 Migraine without aura, not intractable, without status migrainosus: Secondary | ICD-10-CM

## 2019-06-26 NOTE — Telephone Encounter (Signed)
Requested medication (s) are due for refill today: yes  Requested medication (s) are on the active medication list: yes  Last refill:  02/06/2019  Future visit scheduled: no  Notes to clinic:  No valid encounter in last 12 months    Requested Prescriptions  Pending Prescriptions Disp Refills   SUMAtriptan (IMITREX) 50 MG tablet [Pharmacy Med Name: SUMATRIPTAN SUCC 50 MG TABLET] 10 tablet 0    Sig: Take 50 mg at the onset of headache; may repeat every 2 hours but no more than 200 mg/24 hours.     Neurology:  Migraine Therapy - Triptan Failed - 06/25/2019  7:58 PM      Failed - Valid encounter within last 12 months    Recent Outpatient Visits          4 months ago Migraine without aura and without status migrainosus, not intractable   Primary Care at [facility], Ines Bloomer, MD   1 year ago Degenerative disc disease at L5-S1 level   Primary Care at [facility], Renette Butters, MD   1 year ago Pain in joint of right shoulder   Primary Care at [facility], Renette Butters, MD   1 year ago Iron deficiency anemia secondary to inadequate dietary iron intake   Primary Care at [facility], Renette Butters, MD   1 year ago Essential hypertension   Primary Care at [facility], Renette Butters, MD             Passed - Last BP in normal range    BP Readings from Last 1 Encounters:  02/11/19 116/85

## 2019-07-23 ENCOUNTER — Other Ambulatory Visit: Payer: Self-pay | Admitting: Emergency Medicine

## 2019-07-23 DIAGNOSIS — G43009 Migraine without aura, not intractable, without status migrainosus: Secondary | ICD-10-CM

## 2020-05-27 ENCOUNTER — Other Ambulatory Visit: Payer: Self-pay

## 2020-05-27 ENCOUNTER — Encounter: Payer: Self-pay | Admitting: Emergency Medicine

## 2020-05-27 ENCOUNTER — Emergency Department
Admission: EM | Admit: 2020-05-27 | Discharge: 2020-05-27 | Disposition: A | Discharge status: Home / Self Care | Payer: Federal, State, Local not specified - PPO | Attending: Emergency Medicine | Admitting: Emergency Medicine

## 2020-05-27 DIAGNOSIS — I10 Essential (primary) hypertension: Secondary | ICD-10-CM | POA: Insufficient documentation

## 2020-05-27 DIAGNOSIS — Y9389 Activity, other specified: Secondary | ICD-10-CM | POA: Insufficient documentation

## 2020-05-27 DIAGNOSIS — Y929 Unspecified place or not applicable: Secondary | ICD-10-CM | POA: Diagnosis not present

## 2020-05-27 DIAGNOSIS — Y999 Unspecified external cause status: Secondary | ICD-10-CM | POA: Insufficient documentation

## 2020-05-27 DIAGNOSIS — S299XXA Unspecified injury of thorax, initial encounter: Secondary | ICD-10-CM | POA: Diagnosis not present

## 2020-05-27 DIAGNOSIS — S39012A Strain of muscle, fascia and tendon of lower back, initial encounter: Secondary | ICD-10-CM | POA: Diagnosis not present

## 2020-05-27 DIAGNOSIS — S239XXA Sprain of unspecified parts of thorax, initial encounter: Secondary | ICD-10-CM | POA: Diagnosis not present

## 2020-05-27 DIAGNOSIS — X500XXA Overexertion from strenuous movement or load, initial encounter: Secondary | ICD-10-CM | POA: Diagnosis not present

## 2020-05-27 DIAGNOSIS — S29012A Strain of muscle and tendon of back wall of thorax, initial encounter: Secondary | ICD-10-CM | POA: Diagnosis not present

## 2020-05-27 DIAGNOSIS — Z79899 Other long term (current) drug therapy: Secondary | ICD-10-CM | POA: Insufficient documentation

## 2020-05-27 MED ORDER — PREDNISONE 20 MG PO TABS: 20.0000 mg | ORAL_TABLET | Freq: Two times a day (BID) | ORAL | 0 refills | Status: AC

## 2020-05-27 MED ORDER — LIDOCAINE 5 % EX PTCH: 1.0000 | MEDICATED_PATCH | CUTANEOUS | 0 refills | Status: AC

## 2020-05-27 MED ORDER — KETOROLAC TROMETHAMINE 30 MG/ML IJ SOLN
30.0000 mg | Freq: Once | INTRAMUSCULAR | Status: AC
  Administered 2020-05-27: 30 mg via INTRAMUSCULAR
  Filled 2020-05-27: qty 1

## 2020-05-27 NOTE — ED Triage Notes (Signed)
Patient reports putting book bag in car this morning and feeling pain in back. History of lumbar issues.

## 2020-05-27 NOTE — ED Provider Notes (Signed)
[facility] Emergency Department Provider Note ____________________________________________  Time seen: 1143  I have reviewed the triage vital signs and the nursing notes.  HISTORY  Chief Complaint  Back Pain  HPI Tammy [name] is a 44 y.o. female presents herself to the ED for evaluation of acute mid back muscle strain.  Patient with a history of chronic intermittent muscle pain and right shoulder pain, presents after she went to put a large back into the trunk.  She apparently strained her back at the time and had immediate muscle pain and spasm.  She denies any chest pain, shortness of breath, or syncope.  She denies any distal paresthesias, weakness, or  bladder bowel incontinence.  She takes meloxicam intermittently and also keeps a prescription for Flexeril.  Past Medical History:  Diagnosis Date  . Allergy   . Anxiety   . Blood transfusion without reported diagnosis   . Chronic right shoulder pain   . Fe deficiency anemia   . History of seizure disorder   . Hypertension   . Migraines   . SVD (spontaneous vaginal delivery)    x 5    Patient Active Problem List   Diagnosis Date Noted  . 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 Vitamin B12 deficiency 06/01/2018  . Iron deficiency anemia 05/26/2018  . 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 Hypertension 01/25/2018  . Hx of seizure disorder 01/25/2018  . Anxiety disorder 01/25/2018  . History of gastric bypass 01/25/2018  . Fe deficiency anemia 01/25/2018  . Chronic headache 01/25/2018  . Chronic right shoulder pain 01/25/2018  . Degenerative disc disease at L5-S1 level 07/23/2012    Past Surgical History:  Procedure Laterality Date  . DILITATION & CURRETTAGE/HYSTROSCOPY WITH HYDROTHERMAL ABLATION  10/11/2012   Procedure: DILATATION & CURETTAGE/HYSTEROSCOPY WITH HYDROTHERMAL ABLATION;  Surgeon: Frederico Hamman, MD;  Location: [facility];  Service: Gynecology;  Laterality: N/A;  .  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 GASTRIC OUTLET OBSTRUCTION RELEASE  2005   post gastric bypass  . TUBAL LIGATION  2002    Prior to Admission medications   Medication Sig Start Date End Date Taking? Authorizing Provider  ALPRAZolam Duanne Moron) 0.5 MG tablet 1 AT MIDDAY AND 2 TABLETS AT BEDTIME 06/26/18   Wardell Honour, MD  amLODipine (NORVASC) 5 MG tablet Take 1 tablet (5 mg total) by mouth daily. 06/26/18   Wardell Honour, MD  Calcium Carbonate-Vit D-Min (CALCIUM 1200 PO) Take 1 tablet by mouth at bedtime.     [provider]  cyanocobalamin (,VITAMIN B-12,) 1000 MCG/ML injection Inject 1 mL (1,000 mcg total) into the muscle every 30 (thirty) days. 04/04/18   Wardell Honour, MD  FLUoxetine (PROZAC) 40 MG capsule Take 1 capsule (40 mg total) by mouth daily. 06/26/18   Wardell Honour, MD  folic acid (FOLVITE) 1 MG tablet Take 1 tablet (1 mg total) by mouth daily. 06/26/18   Wardell Honour, MD  lidocaine (LIDODERM) 5 % Place 1 patch onto the skin daily for 5 days. Remove & Discard patch after 12 hours of wear each day. 05/27/20 06/01/20  Genevive Printup, Dannielle Karvonen, PA-C  lisinopril-hydrochlorothiazide (PRINZIDE,ZESTORETIC) 10-12.5 MG tablet TAKE 1 TABLET BY MOUTH DAILY 06/26/18   Wardell Honour, MD  Multiple Vitamin (MULTIVITAMIN) tablet Take 1 tablet by mouth daily.    [provider]  predniSONE (DELTASONE) 20 MG tablet Take 1 tablet (20 mg total) by mouth 2 (two)  times daily with a meal for 5 days. 05/27/20 06/01/20  Ceyda Peterka, Dannielle Karvonen, PA-C  SUMAtriptan (IMITREX) 50 MG tablet TAKE 50 MG AT THE ONSET OF HEADACHE MAY REPEAT EVERY 2 HOURS BUT NO MORE THAN 200 MG/24 HOURS. 06/27/19   Horald Pollen, MD    Allergies Nsaids  Family History  Problem Relation Age of Onset  . Hypertension Mother   . Hepatitis Mother   . Hyperthyroidism Mother   . Heart disease Mother   . Stroke Mother   . Moyamoya disease Mother   . Depression Mother   .  Obesity Sister   . Hypertension Sister   . Cancer Maternal Grandmother        uterine  . Heart disease Maternal Grandmother   . Hypertension Maternal Grandmother   . Cancer Maternal Grandfather        multiple myeloma  . Hypertension Maternal Grandfather   . Bipolar disorder Father   . Hypertension Father   . Hepatitis Father   . Migraines Daughter     Social History Social History   Tobacco Use  . Smoking status: Never Smoker  . Smokeless tobacco: Never Used  Vaping Use  . Vaping Use: Never used  Substance Use Topics  . Alcohol use: Yes    Comment: rare   . Drug use: No    Review of Systems  Constitutional: Negative for fever. Cardiovascular: Negative for chest pain. Respiratory: Negative for shortness of breath. Gastrointestinal: Negative for abdominal pain, vomiting and diarrhea. Genitourinary: Negative for dysuria. Musculoskeletal: Positive for left mid back pain. Skin: Negative for rash. Neurological: Negative for headaches, focal weakness or numbness. ____________________________________________  PHYSICAL EXAM:  VITAL SIGNS: ED Triage Vitals  Enc Vitals Group     BP --      Pulse Rate 05/27/20 1119 83     Resp 05/27/20 1119 16     Temp 05/27/20 1119 98.9 F (37.2 C)     Temp Source 05/27/20 1119 Oral     SpO2 05/27/20 1119 100 %     Weight 05/27/20 1120 190 lb (86.2 kg)     Height 05/27/20 1120 5' 7"  (1.702 m)     Head Circumference --      Peak Flow --      Pain Score 05/27/20 1120 9     Pain Loc --      Pain Edu? --      Excl. in [place]? --     Constitutional: Alert and oriented. Well appearing and in no distress. Head: Normocephalic and atraumatic. Eyes: Conjunctivae are normal. Normal extraocular movements Neck: Supple. Normal ROM Cardiovascular: Normal rate, regular rhythm. Normal distal pulses. Respiratory: Normal respiratory effort. No wheezes/rales/rhonchi. Gastrointestinal: Soft and nontender. No distention. Musculoskeletal: Normal  spinal alignment without midline tenderness, spasm, deformity, or step-off.  Patient mildly tender to palpation to the left scapulothoracic musculature in the medial scapular border.  Nontender with normal range of motion in all extremities.  Neurologic: Cranial nerves II through XII grossly intact.  Normal UE/LE DTRs bilaterally.  Normal gait without ataxia. Normal speech and language. No gross focal neurologic deficits are appreciated. Skin:  Skin is warm, dry and intact. No rash noted. ____________________________________________  PROCEDURES  Toradol 30 mg IM Procedures ____________________________________________  INITIAL IMPRESSION / ASSESSMENT AND PLAN / ED COURSE  Patient with ED evaluation of acute musculoskeletal pain to the left mid back after mechanical injury.  Exam is overall benign reassuring at this time.  Patient is reporting improvement  of her symptoms after IM anti-inflammatory.  She will be discharged with a prescription for Lidoderm patches as well as prednisone to take as directed.  She will follow with primary provider return to the ED as needed.  LANITA STAMMEN was evaluated in Emergency Department on 05/27/2020 for the symptoms described in the history of present illness. She was evaluated in the context of the global COVID-19 pandemic, which necessitated consideration that the patient might be at risk for infection with the SARS-CoV-2 virus that causes COVID-19. Institutional protocols and algorithms that pertain to the evaluation of patients at risk for COVID-19 are in a state of rapid change based on information released by regulatory bodies including the CDC and federal and state organizations. These policies and algorithms were followed during the patient's care in the ED. ___________________________________________  FINAL CLINICAL IMPRESSION(S) / ED DIAGNOSES  Final diagnoses:  Thoracic back sprain, initial encounter      Melvenia Needles,  PA-C 05/27/20 1243    Vanessa Bath, MD 05/28/20 [phone]

## 2020-05-27 NOTE — Discharge Instructions (Signed)
Your exam is consistent with midback muscle strain. Take the prescription meds as directed. Follow-up with your provider for continued symptoms.

## 2020-05-27 NOTE — ED Notes (Addendum)
See triage note  Presents with mid back pain  States she developed pain after placing book bag in car. Pain is mainly under left shoulder blade

## 2020-05-28 ENCOUNTER — Telehealth: Payer: Self-pay | Admitting: Physician Assistant

## 2020-05-28 MED ORDER — CYCLOBENZAPRINE HCL 5 MG PO TABS: 5.0000 mg | ORAL_TABLET | Freq: Three times a day (TID) | ORAL | 0 refills | Status: DC | PRN

## 2020-05-28 NOTE — Telephone Encounter (Cosign Needed)
Patient requested a refill of her Flexeril after discharge.   Refill sent as requested, to pharmacy on file.

## 2020-06-17 DIAGNOSIS — F429 Obsessive-compulsive disorder, unspecified: Secondary | ICD-10-CM | POA: Diagnosis not present

## 2020-06-17 DIAGNOSIS — N920 Excessive and frequent menstruation with regular cycle: Secondary | ICD-10-CM | POA: Diagnosis not present

## 2020-06-17 DIAGNOSIS — M25511 Pain in right shoulder: Secondary | ICD-10-CM | POA: Diagnosis not present

## 2020-06-17 DIAGNOSIS — I10 Essential (primary) hypertension: Secondary | ICD-10-CM | POA: Diagnosis not present

## 2020-06-25 DIAGNOSIS — I10 Essential (primary) hypertension: Secondary | ICD-10-CM | POA: Diagnosis not present

## 2020-08-05 DIAGNOSIS — Z13220 Encounter for screening for lipoid disorders: Secondary | ICD-10-CM | POA: Diagnosis not present

## 2020-08-05 DIAGNOSIS — Z0000 Encounter for general adult medical examination without abnormal findings: Secondary | ICD-10-CM | POA: Diagnosis not present

## 2020-11-25 ENCOUNTER — Encounter: Payer: Self-pay | Admitting: Emergency Medicine

## 2020-11-25 ENCOUNTER — Inpatient Hospital Stay
Admission: EM | Admit: 2020-11-25 | Discharge: 2020-11-27 | DRG: 378 | Disposition: A | Discharge status: Home / Self Care | Payer: Federal, State, Local not specified - PPO | Attending: Internal Medicine | Admitting: Internal Medicine

## 2020-11-25 ENCOUNTER — Observation Stay: Payer: Federal, State, Local not specified - PPO | Admitting: Certified Registered Nurse Anesthetist

## 2020-11-25 ENCOUNTER — Other Ambulatory Visit: Payer: Self-pay

## 2020-11-25 ENCOUNTER — Encounter
Admission: EM | Disposition: A | Discharge status: Home / Self Care | Payer: Self-pay | Source: Home / Self Care | Attending: Internal Medicine

## 2020-11-25 DIAGNOSIS — E538 Deficiency of other specified B group vitamins: Secondary | ICD-10-CM | POA: Diagnosis present

## 2020-11-25 DIAGNOSIS — I10 Essential (primary) hypertension: Secondary | ICD-10-CM | POA: Diagnosis not present

## 2020-11-25 DIAGNOSIS — Z886 Allergy status to analgesic agent status: Secondary | ICD-10-CM

## 2020-11-25 DIAGNOSIS — K920 Hematemesis: Secondary | ICD-10-CM

## 2020-11-25 DIAGNOSIS — K449 Diaphragmatic hernia without obstruction or gangrene: Secondary | ICD-10-CM | POA: Diagnosis present

## 2020-11-25 DIAGNOSIS — Z980 Intestinal bypass and anastomosis status: Secondary | ICD-10-CM

## 2020-11-25 DIAGNOSIS — Z79899 Other long term (current) drug therapy: Secondary | ICD-10-CM

## 2020-11-25 DIAGNOSIS — D62 Acute posthemorrhagic anemia: Secondary | ICD-10-CM | POA: Diagnosis present

## 2020-11-25 DIAGNOSIS — K259 Gastric ulcer, unspecified as acute or chronic, without hemorrhage or perforation: Secondary | ICD-10-CM | POA: Diagnosis present

## 2020-11-25 DIAGNOSIS — Z20822 Contact with and (suspected) exposure to covid-19: Secondary | ICD-10-CM | POA: Diagnosis present

## 2020-11-25 DIAGNOSIS — R110 Nausea: Secondary | ICD-10-CM | POA: Diagnosis not present

## 2020-11-25 DIAGNOSIS — G43909 Migraine, unspecified, not intractable, without status migrainosus: Secondary | ICD-10-CM | POA: Diagnosis present

## 2020-11-25 DIAGNOSIS — G8929 Other chronic pain: Secondary | ICD-10-CM | POA: Diagnosis present

## 2020-11-25 DIAGNOSIS — M549 Dorsalgia, unspecified: Secondary | ICD-10-CM | POA: Diagnosis present

## 2020-11-25 DIAGNOSIS — G40909 Epilepsy, unspecified, not intractable, without status epilepticus: Secondary | ICD-10-CM | POA: Diagnosis present

## 2020-11-25 DIAGNOSIS — Z9884 Bariatric surgery status: Secondary | ICD-10-CM

## 2020-11-25 DIAGNOSIS — Z8249 Family history of ischemic heart disease and other diseases of the circulatory system: Secondary | ICD-10-CM

## 2020-11-25 DIAGNOSIS — K922 Gastrointestinal hemorrhage, unspecified: Secondary | ICD-10-CM

## 2020-11-25 DIAGNOSIS — F418 Other specified anxiety disorders: Secondary | ICD-10-CM | POA: Diagnosis not present

## 2020-11-25 DIAGNOSIS — Z818 Family history of other mental and behavioral disorders: Secondary | ICD-10-CM

## 2020-11-25 DIAGNOSIS — Z807 Family history of other malignant neoplasms of lymphoid, hematopoietic and related tissues: Secondary | ICD-10-CM

## 2020-11-25 DIAGNOSIS — Z823 Family history of stroke: Secondary | ICD-10-CM

## 2020-11-25 DIAGNOSIS — K250 Acute gastric ulcer with hemorrhage: Secondary | ICD-10-CM | POA: Diagnosis not present

## 2020-11-25 DIAGNOSIS — D519 Vitamin B12 deficiency anemia, unspecified: Secondary | ICD-10-CM

## 2020-11-25 HISTORY — PX: ESOPHAGOGASTRODUODENOSCOPY: SHX5428

## 2020-11-25 LAB — URINALYSIS, COMPLETE (UACMP) WITH MICROSCOPIC
Bacteria, UA: NONE SEEN
Glucose, UA: NEGATIVE mg/dL
Hgb urine dipstick: NEGATIVE
Ketones, ur: 80 mg/dL — AB
Nitrite: NEGATIVE
Protein, ur: 100 mg/dL — AB
Specific Gravity, Urine: 1.031 — ABNORMAL HIGH (ref 1.005–1.030)
pH: 5 (ref 5.0–8.0)

## 2020-11-25 LAB — CBC
HCT: 38.2 % (ref 36.0–46.0)
HCT: 33.6 % — ABNORMAL LOW (ref 36.0–46.0)
HCT: 33.7 % — ABNORMAL LOW (ref 36.0–46.0)
Hemoglobin: 11.4 g/dL — ABNORMAL LOW (ref 12.0–15.0)
Hemoglobin: 10.5 g/dL — ABNORMAL LOW (ref 12.0–15.0)
Hemoglobin: 10.9 g/dL — ABNORMAL LOW (ref 12.0–15.0)
MCH: 26 pg (ref 26.0–34.0)
MCH: 26.9 pg (ref 26.0–34.0)
MCH: 27.3 pg (ref 26.0–34.0)
MCHC: 29.8 g/dL — ABNORMAL LOW (ref 30.0–36.0)
MCHC: 31.3 g/dL (ref 30.0–36.0)
MCHC: 32.3 g/dL (ref 30.0–36.0)
MCV: 87.2 fL (ref 80.0–100.0)
MCV: 85.9 fL (ref 80.0–100.0)
MCV: 84.3 fL (ref 80.0–100.0)
Platelets: 604 10*3/uL — ABNORMAL HIGH (ref 150–400)
Platelets: 571 10*3/uL — ABNORMAL HIGH (ref 150–400)
Platelets: 568 10*3/uL — ABNORMAL HIGH (ref 150–400)
RBC: 4.38 MIL/uL (ref 3.87–5.11)
RBC: 3.91 MIL/uL (ref 3.87–5.11)
RBC: 4 MIL/uL (ref 3.87–5.11)
RDW: 14.9 % (ref 11.5–15.5)
RDW: 14.8 % (ref 11.5–15.5)
RDW: 14.8 % (ref 11.5–15.5)
WBC: 11.2 10*3/uL — ABNORMAL HIGH (ref 4.0–10.5)
WBC: 14.1 10*3/uL — ABNORMAL HIGH (ref 4.0–10.5)
WBC: 22.6 10*3/uL — ABNORMAL HIGH (ref 4.0–10.5)
nRBC: 0 % (ref 0.0–0.2)
nRBC: 0 % (ref 0.0–0.2)
nRBC: 0 % (ref 0.0–0.2)

## 2020-11-25 LAB — COMPREHENSIVE METABOLIC PANEL
ALT: 7 U/L (ref 0–44)
AST: 11 U/L — ABNORMAL LOW (ref 15–41)
Albumin: 4.1 g/dL (ref 3.5–5.0)
Alkaline Phosphatase: 63 U/L (ref 38–126)
Anion gap: 15 (ref 5–15)
BUN: 26 mg/dL — ABNORMAL HIGH (ref 6–20)
CO2: 21 mmol/L — ABNORMAL LOW (ref 22–32)
Calcium: 9.4 mg/dL (ref 8.9–10.3)
Chloride: 103 mmol/L (ref 98–111)
Creatinine, Ser: 0.65 mg/dL (ref 0.44–1.00)
GFR, Estimated: >60 mL/min (ref >60)
Glucose, Bld: 110 mg/dL — ABNORMAL HIGH (ref 70–99)
Potassium: 4 mmol/L (ref 3.5–5.1)
Sodium: 139 mmol/L (ref 135–145)
Total Bilirubin: 0.8 mg/dL (ref 0.3–1.2)
Total Protein: 8.1 g/dL (ref 6.5–8.1)

## 2020-11-25 LAB — CBC WITH DIFFERENTIAL/PLATELET
Abs Immature Granulocytes: 0.05 10*3/uL (ref 0.00–0.07)
Basophils Absolute: 0 10*3/uL (ref 0.0–0.1)
Basophils Relative: 0 %
Eosinophils Absolute: 0 10*3/uL (ref 0.0–0.5)
Eosinophils Relative: 0 %
HCT: 36.6 % (ref 36.0–46.0)
Hemoglobin: 11.8 g/dL — ABNORMAL LOW (ref 12.0–15.0)
Immature Granulocytes: 1 %
Lymphocytes Relative: 16 %
Lymphs Abs: 1.5 10*3/uL (ref 0.7–4.0)
MCH: 26.6 pg (ref 26.0–34.0)
MCHC: 32.2 g/dL (ref 30.0–36.0)
MCV: 82.4 fL (ref 80.0–100.0)
Monocytes Absolute: 0.6 10*3/uL (ref 0.1–1.0)
Monocytes Relative: 6 %
Neutro Abs: 7.7 10*3/uL (ref 1.7–7.7)
Neutrophils Relative %: 77 %
Platelets: 668 10*3/uL — ABNORMAL HIGH (ref 150–400)
RBC: 4.44 MIL/uL (ref 3.87–5.11)
RDW: 14.7 % (ref 11.5–15.5)
WBC: 9.9 10*3/uL (ref 4.0–10.5)
nRBC: 0 % (ref 0.0–0.2)

## 2020-11-25 LAB — PROTIME-INR
INR: 1 (ref 0.8–1.2)
Prothrombin Time: 12.6 seconds (ref 11.4–15.2)

## 2020-11-25 LAB — LIPASE, BLOOD: Lipase: 27 U/L (ref 11–51)

## 2020-11-25 LAB — RESP PANEL BY RT-PCR (FLU A&B, COVID) ARPGX2
Influenza A by PCR: NEGATIVE
Influenza B by PCR: NEGATIVE
SARS Coronavirus 2 by RT PCR: NEGATIVE

## 2020-11-25 LAB — PREGNANCY, URINE: Preg Test, Ur: NEGATIVE

## 2020-11-25 LAB — APTT: aPTT: 27 seconds (ref 24–36)

## 2020-11-25 LAB — HIV ANTIBODY (ROUTINE TESTING W REFLEX): HIV Screen 4th Generation wRfx: NONREACTIVE

## 2020-11-25 SURGERY — EGD (ESOPHAGOGASTRODUODENOSCOPY)
Anesthesia: General

## 2020-11-25 MED ORDER — PROMETHAZINE HCL 25 MG/ML IJ SOLN
INTRAMUSCULAR | Status: AC
  Filled 2020-11-25: qty 1

## 2020-11-25 MED ORDER — FENTANYL CITRATE (PF) 100 MCG/2ML IJ SOLN
INTRAMUSCULAR | Status: DC | PRN
  Administered 2020-11-25: 25 ug via INTRAVENOUS
  Administered 2020-11-25: 50 ug via INTRAVENOUS
  Administered 2020-11-25: 25 ug via INTRAVENOUS

## 2020-11-25 MED ORDER — FLUOXETINE HCL 20 MG PO CAPS
40.0000 mg | ORAL_CAPSULE | Freq: Every day | ORAL | Status: DC
Start: 2020-11-25 — End: 2020-11-27
  Administered 2020-11-26 – 2020-11-27 (×2): 40 mg via ORAL
  Filled 2020-11-25 (×3): qty 2

## 2020-11-25 MED ORDER — PROPOFOL 500 MG/50ML IV EMUL
INTRAVENOUS | Status: AC
  Filled 2020-11-25: qty 50

## 2020-11-25 MED ORDER — PANTOPRAZOLE SODIUM 40 MG IV SOLR
40.0000 mg | Freq: Once | INTRAVENOUS | Status: AC
  Administered 2020-11-25: 40 mg via INTRAVENOUS
  Filled 2020-11-25: qty 40

## 2020-11-25 MED ORDER — ONDANSETRON HCL 4 MG/2ML IJ SOLN
4.0000 mg | Freq: Three times a day (TID) | INTRAMUSCULAR | Status: DC | PRN
  Administered 2020-11-25 – 2020-11-27 (×4): 4 mg via INTRAVENOUS
  Filled 2020-11-25 (×4): qty 2

## 2020-11-25 MED ORDER — SUMATRIPTAN SUCCINATE 50 MG PO TABS
50.0000 mg | ORAL_TABLET | ORAL | Status: DC | PRN
  Administered 2020-11-25 – 2020-11-26 (×2): 50 mg via ORAL
  Filled 2020-11-25 (×5): qty 1

## 2020-11-25 MED ORDER — PANTOPRAZOLE SODIUM 40 MG IV SOLR: 40.0000 mg | Freq: Two times a day (BID) | INTRAVENOUS | Status: DC

## 2020-11-25 MED ORDER — SODIUM CHLORIDE 0.9 % IV SOLN: INTRAVENOUS | Status: DC

## 2020-11-25 MED ORDER — PROPOFOL 500 MG/50ML IV EMUL
INTRAVENOUS | Status: DC | PRN
  Administered 2020-11-25: 150 ug/kg/min via INTRAVENOUS

## 2020-11-25 MED ORDER — HYDROCHLOROTHIAZIDE 25 MG PO TABS
25.0000 mg | ORAL_TABLET | Freq: Every day | ORAL | Status: DC
  Administered 2020-11-26 – 2020-11-27 (×2): 25 mg via ORAL
  Filled 2020-11-25 (×2): qty 1

## 2020-11-25 MED ORDER — SODIUM CHLORIDE 0.9 % IV SOLN
8.0000 mg/h | INTRAVENOUS | Status: DC
  Administered 2020-11-25 – 2020-11-27 (×4): 8 mg/h via INTRAVENOUS
  Filled 2020-11-25 (×4): qty 80

## 2020-11-25 MED ORDER — ONDANSETRON HCL 4 MG/2ML IJ SOLN
4.0000 mg | Freq: Once | INTRAMUSCULAR | Status: AC
  Administered 2020-11-25: 4 mg via INTRAVENOUS
  Filled 2020-11-25: qty 2

## 2020-11-25 MED ORDER — ONDANSETRON HCL 4 MG/2ML IJ SOLN
INTRAMUSCULAR | Status: DC | PRN
  Administered 2020-11-25: 4 mg via INTRAVENOUS

## 2020-11-25 MED ORDER — LACTATED RINGERS IV BOLUS
1000.0000 mL | Freq: Once | INTRAVENOUS | Status: AC
  Administered 2020-11-25: 1000 mL via INTRAVENOUS

## 2020-11-25 MED ORDER — ONDANSETRON HCL 4 MG/2ML IJ SOLN
INTRAMUSCULAR | Status: AC
  Administered 2020-11-25: 4 mg via INTRAVENOUS
  Filled 2020-11-25: qty 2

## 2020-11-25 MED ORDER — LIDOCAINE HCL (CARDIAC) PF 100 MG/5ML IV SOSY
PREFILLED_SYRINGE | INTRAVENOUS | Status: DC | PRN
  Administered 2020-11-25: 80 mg via INTRAVENOUS

## 2020-11-25 MED ORDER — LABETALOL HCL 5 MG/ML IV SOLN
10.0000 mg | Freq: Once | INTRAVENOUS | Status: AC
  Administered 2020-11-25: 10 mg via INTRAVENOUS
  Filled 2020-11-25: qty 4

## 2020-11-25 MED ORDER — SUCRALFATE 1 GM/10ML PO SUSP
1.0000 g | Freq: Three times a day (TID) | ORAL | Status: DC
  Administered 2020-11-25 – 2020-11-27 (×8): 1 g via ORAL
  Filled 2020-11-25 (×11): qty 10

## 2020-11-25 MED ORDER — METOCLOPRAMIDE HCL 5 MG/ML IJ SOLN
5.0000 mg | Freq: Once | INTRAMUSCULAR | Status: AC
  Administered 2020-11-25: 5 mg via INTRAVENOUS
  Filled 2020-11-25: qty 2

## 2020-11-25 MED ORDER — MIDAZOLAM HCL 2 MG/2ML IJ SOLN
INTRAMUSCULAR | Status: AC
  Filled 2020-11-25: qty 2

## 2020-11-25 MED ORDER — HYDRALAZINE HCL 20 MG/ML IJ SOLN: 5.0000 mg | INTRAMUSCULAR | Status: DC | PRN

## 2020-11-25 MED ORDER — ALPRAZOLAM 0.5 MG PO TABS: 0.5000 mg | ORAL_TABLET | Freq: Two times a day (BID) | ORAL | Status: DC | PRN

## 2020-11-25 MED ORDER — GABAPENTIN 300 MG PO CAPS
300.0000 mg | ORAL_CAPSULE | Freq: Three times a day (TID) | ORAL | Status: DC
  Administered 2020-11-25 – 2020-11-27 (×5): 300 mg via ORAL
  Filled 2020-11-25 (×5): qty 1

## 2020-11-25 MED ORDER — MORPHINE SULFATE (PF) 2 MG/ML IV SOLN
2.0000 mg | Freq: Once | INTRAVENOUS | Status: AC
  Administered 2020-11-25: 2 mg via INTRAVENOUS
  Filled 2020-11-25: qty 1

## 2020-11-25 MED ORDER — CYCLOBENZAPRINE HCL 10 MG PO TABS
10.0000 mg | ORAL_TABLET | Freq: Three times a day (TID) | ORAL | Status: DC
  Administered 2020-11-25 – 2020-11-27 (×5): 10 mg via ORAL
  Filled 2020-11-25 (×5): qty 1

## 2020-11-25 MED ORDER — AMLODIPINE BESYLATE 10 MG PO TABS
10.0000 mg | ORAL_TABLET | Freq: Every day | ORAL | Status: DC
  Administered 2020-11-26 – 2020-11-27 (×2): 10 mg via ORAL
  Filled 2020-11-25 (×2): qty 1

## 2020-11-25 MED ORDER — FENTANYL CITRATE (PF) 100 MCG/2ML IJ SOLN
INTRAMUSCULAR | Status: AC
  Filled 2020-11-25: qty 2

## 2020-11-25 MED ORDER — MIDAZOLAM HCL 2 MG/2ML IJ SOLN
INTRAMUSCULAR | Status: DC | PRN
  Administered 2020-11-25: 2 mg via INTRAVENOUS

## 2020-11-25 MED ORDER — LISINOPRIL 20 MG PO TABS
40.0000 mg | ORAL_TABLET | Freq: Every day | ORAL | Status: DC
  Administered 2020-11-26 – 2020-11-27 (×2): 40 mg via ORAL
  Filled 2020-11-25 (×2): qty 2

## 2020-11-25 MED ORDER — PROPOFOL 10 MG/ML IV BOLUS
INTRAVENOUS | Status: DC | PRN
  Administered 2020-11-25: 20 mg via INTRAVENOUS
  Administered 2020-11-25: 30 mg via INTRAVENOUS
  Administered 2020-11-25: 50 mg via INTRAVENOUS
  Administered 2020-11-25: 40 mg via INTRAVENOUS
  Administered 2020-11-25: 50 mg via INTRAVENOUS
  Administered 2020-11-25: 60 mg via INTRAVENOUS

## 2020-11-25 MED ORDER — ACETAMINOPHEN 325 MG PO TABS
650.0000 mg | ORAL_TABLET | Freq: Four times a day (QID) | ORAL | Status: DC | PRN
  Administered 2020-11-25 (×2): 650 mg via ORAL
  Filled 2020-11-25 (×2): qty 2

## 2020-11-25 MED ORDER — ONDANSETRON HCL 4 MG/2ML IJ SOLN
4.0000 mg | Freq: Once | INTRAMUSCULAR | Status: DC
  Filled 2020-11-25: qty 2

## 2020-11-25 MED ORDER — OXYCODONE-ACETAMINOPHEN 5-325 MG PO TABS
1.0000 | ORAL_TABLET | Freq: Four times a day (QID) | ORAL | Status: DC | PRN
  Administered 2020-11-25: 1 via ORAL
  Filled 2020-11-25: qty 1

## 2020-11-25 NOTE — ED Provider Notes (Addendum)
[facility] Emergency Department Provider Note   ____________________________________________   Event Date/Time   First MD Initiated Contact with Patient 11/25/20 [phone]     (approximate)  I have reviewed the triage vital signs and the nursing notes.   HISTORY  Chief Complaint Emesis    HPI Tammy [name] is a 44 y.o. female with past medical history of hypertension, migraines, and gastric bypass who presents to the ED complaining of hematemesis.  Patient reports that she has been dealing with acute on chronic pain in her left shoulder and back for the past few days, for which she has been taking Aleve.  Yesterday morning, she developed nausea and vomiting with her emesis containing a significant amount of blood.  This seemed to ease up throughout the day, but recurred overnight and into this morning.  She reports multiple episodes of at least a cupful of blood.  She has not noticed any blood in her stool and denies any dark tarry stool.  She denies any history of GI bleeds and does not take any blood thinners.  She denies any associated abdominal pain, fever, cough, chest pain, or shortness of breath.        Past Medical History:  Diagnosis Date  . Allergy   . Anxiety   . Blood transfusion without reported diagnosis   . Chronic right shoulder pain   . Fe deficiency anemia   . History of seizure disorder   . Hypertension   . Migraines   . SVD (spontaneous vaginal delivery)    x 5    Patient Active Problem List   Diagnosis Date Noted  . Hematemesis 11/25/2020  . Depression with anxiety 11/25/2020  . HTN (hypertension) 11/25/2020  . 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 Vitamin B12 deficiency 06/01/2018  . Iron deficiency anemia 05/26/2018  . 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 Hypertension 01/25/2018  . Hx of seizure disorder 01/25/2018  . Anxiety disorder 01/25/2018  . History of gastric bypass 01/25/2018  . Fe deficiency  anemia 01/25/2018  . Chronic headache 01/25/2018  . Chronic right shoulder pain 01/25/2018  . Degenerative disc disease at L5-S1 level 07/23/2012    Past Surgical History:  Procedure Laterality Date  . DILITATION & CURRETTAGE/HYSTROSCOPY WITH HYDROTHERMAL ABLATION  10/11/2012   Procedure: DILATATION & CURETTAGE/HYSTEROSCOPY WITH HYDROTHERMAL ABLATION;  Surgeon: Frederico Hamman, MD;  Location: [facility];  Service: Gynecology;  Laterality: N/A;  . 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 GASTRIC OUTLET OBSTRUCTION RELEASE  2005   post gastric bypass  . TUBAL LIGATION  2002    Prior to Admission medications   Medication Sig Start Date End Date Taking? Authorizing Provider  ALPRAZolam Duanne Moron) 0.5 MG tablet 1 AT MIDDAY AND 2 TABLETS AT BEDTIME 06/26/18   Wardell Honour, MD  amLODipine (NORVASC) 5 MG tablet Take 1 tablet (5 mg total) by mouth daily. 06/26/18   Wardell Honour, MD  Calcium Carbonate-Vit D-Min (CALCIUM 1200 PO) Take 1 tablet by mouth at bedtime.     [provider]  cyanocobalamin (,VITAMIN B-12,) 1000 MCG/ML injection Inject 1 mL (1,000 mcg total) into the muscle every 30 (thirty) days. 04/04/18   Wardell Honour, MD  cyclobenzaprine (FLEXERIL) 5 MG tablet Take 1 tablet (5 mg total) by mouth 3 (three) times daily as needed. 05/28/20   Menshew, Dannielle Karvonen, PA-C  FLUoxetine (PROZAC) 40 MG capsule Take 1 capsule (40 mg total) by  mouth daily. 06/26/18   Wardell Honour, MD  folic acid (FOLVITE) 1 MG tablet Take 1 tablet (1 mg total) by mouth daily. 06/26/18   Wardell Honour, MD  lisinopril-hydrochlorothiazide (PRINZIDE,ZESTORETIC) 10-12.5 MG tablet TAKE 1 TABLET BY MOUTH DAILY 06/26/18   Wardell Honour, MD  Multiple Vitamin (MULTIVITAMIN) tablet Take 1 tablet by mouth daily.    [provider]  SUMAtriptan (IMITREX) 50 MG tablet TAKE 50 MG AT THE ONSET OF HEADACHE MAY REPEAT EVERY 2 HOURS BUT NO MORE THAN 200 MG/24 HOURS. 06/27/19   Horald Pollen, MD    Allergies Nsaids  Family History  Problem Relation Age of Onset  . Hypertension Mother   . Hepatitis Mother   . Hyperthyroidism Mother   . Heart disease Mother   . Stroke Mother   . Moyamoya disease Mother   . Depression Mother   . Obesity Sister   . Hypertension Sister   . Cancer Maternal Grandmother        uterine  . Heart disease Maternal Grandmother   . Hypertension Maternal Grandmother   . Cancer Maternal Grandfather        multiple myeloma  . Hypertension Maternal Grandfather   . Bipolar disorder Father   . Hypertension Father   . Hepatitis Father   . Migraines Daughter     Social History Social History   Tobacco Use  . Smoking status: Never Smoker  . Smokeless tobacco: Never Used  Vaping Use  . Vaping Use: Never used  Substance Use Topics  . Alcohol use: Yes    Comment: rare   . Drug use: No    Review of Systems  Constitutional: No fever/chills Eyes: No visual changes. ENT: No sore throat. Cardiovascular: Denies chest pain. Respiratory: Denies shortness of breath. Gastrointestinal: No abdominal pain.  Positive for nausea, vomiting, and hematemesis.  No diarrhea.  No constipation. Genitourinary: Negative for dysuria. Musculoskeletal: Negative for back pain. Skin: Negative for rash. Neurological: Negative for headaches, focal weakness or numbness.  ____________________________________________   PHYSICAL EXAM:  VITAL SIGNS: ED Triage Vitals  Enc Vitals Group     BP 11/25/20 0534 137/87     Pulse Rate 11/25/20 0534 (!) 124     Resp 11/25/20 0534 18     Temp 11/25/20 0534 98.9 F (37.2 C)     Temp src --      SpO2 11/25/20 0534 99 %     Weight 11/25/20 0533 175 lb (79.4 kg)     Height 11/25/20 0533 5' 7"  (1.702 m)     Head Circumference --      Peak Flow --      Pain Score 11/25/20 0540 8     Pain Loc --      Pain Edu? --      Excl. in [place]? --     Constitutional: Alert and oriented. Eyes: Conjunctivae are  normal. Head: Atraumatic. Nose: No congestion/rhinnorhea. Mouth/Throat: Mucous membranes are moist. Neck: Normal ROM Cardiovascular: Normal rate, regular rhythm. Grossly normal heart sounds. Respiratory: Normal respiratory effort.  No retractions. Lungs CTAB. Gastrointestinal: Soft and nontender.  Dark, guaiac positive stool noted on rectal exam.  No distention. Genitourinary: deferred Musculoskeletal: No lower extremity tenderness nor edema. Neurologic:  Normal speech and language. No gross focal neurologic deficits are appreciated. Skin:  Skin is warm, dry and intact. No rash noted. Psychiatric: Mood and affect are normal. Speech and behavior are normal.  ____________________________________________   LABS (all labs ordered  are listed, but only abnormal results are displayed)  Labs Reviewed  CBC WITH DIFFERENTIAL/PLATELET - Abnormal; Notable for the following components:      Result Value   Hemoglobin 11.8 (*)    Platelets 668 (*)    All other components within normal limits  COMPREHENSIVE METABOLIC PANEL - Abnormal; Notable for the following components:   CO2 21 (*)    Glucose, Bld 110 (*)    BUN 26 (*)    AST 11 (*)    All other components within normal limits  RESP PANEL BY RT-PCR (FLU A&B, COVID) ARPGX2  LIPASE, BLOOD  URINALYSIS, COMPLETE (UACMP) WITH MICROSCOPIC    PROCEDURES  Procedure(s) performed (including Critical Care):  Procedures  ED ECG REPORT I, Blake Divine, the attending physician, personally viewed and interpreted this ECG.   Date: 11/25/2020  EKG Time: 8:58  Rate: 71  Rhythm: normal sinus rhythm  Axis: Normal  Intervals:none  ST&T Change: None  ____________________________________________   INITIAL IMPRESSION / ASSESSMENT AND PLAN / ED COURSE       44 year old female with past medical history of hypertension, migraines, and gastric bypass who presents to the ED for hematemesis for about the past 24 hours.  She has not noticed any  blood in her stool but rectal exam shows dark stool that is guaiac positive.  She also has a mild elevation in her BUN along with slight drop in hemoglobin compared to previous.  She is hemodynamically stable and for now we will hydrate with IV fluids, give dose of IV Protonix and Zofran.  Remainder of labs are unremarkable.  Bleeding is likely due to patient's recent NSAID use.  Plan to discuss with hospitalist for admission.      ____________________________________________   FINAL CLINICAL IMPRESSION(S) / ED DIAGNOSES  Final diagnoses:  Upper GI bleed  Hematemesis with nausea     ED Discharge Orders    None       Note:  This document was prepared using Dragon voice recognition software and may include unintentional dictation errors.   Blake Divine, MD 11/25/20 8628    Blake Divine, MD 11/25/20 [id number]

## 2020-11-25 NOTE — Anesthesia Postprocedure Evaluation (Signed)
Anesthesia Post Note  Patient: Tammy [name]  Procedure(s) Performed: ESOPHAGOGASTRODUODENOSCOPY (EGD) (N/A )  Patient location during evaluation: PACU Anesthesia Type: General Level of consciousness: awake and alert, awake and oriented Pain management: pain level controlled Vital Signs Assessment: post-procedure vital signs reviewed and stable Respiratory status: spontaneous breathing, nonlabored ventilation and respiratory function stable Cardiovascular status: blood pressure returned to baseline and stable Postop Assessment: no headache Anesthetic complications: no   No complications documented.   Last Vitals:  Vitals:   11/25/20 1245 11/25/20 1428  BP:  (!) 162/106  Pulse:  97  Resp:  20  Temp: 36.8 C   SpO2:  100%    Last Pain:  Vitals:   11/25/20 1245  TempSrc: Oral  PainSc:                  Manfred Arch

## 2020-11-25 NOTE — Anesthesia Preprocedure Evaluation (Signed)
Anesthesia Evaluation  Patient identified by MRN, date of birth, ID band Patient awake    Reviewed: Allergy & Precautions, H&P , NPO status , Patient's Chart, lab work & pertinent test results  Airway Mallampati: II  TM Distance: >3 FB Neck ROM: Full    Dental no notable dental hx.    Pulmonary neg pulmonary ROS,    Pulmonary exam normal        Cardiovascular hypertension, Pt. on medications negative cardio ROS Normal cardiovascular exam     Neuro/Psych  Headaches, PSYCHIATRIC DISORDERS Anxiety Depression    GI/Hepatic Neg liver ROS,   Endo/Other  negative endocrine ROS  Renal/GU negative Renal ROS  negative genitourinary   Musculoskeletal  (+) Arthritis ,   Abdominal   Peds negative pediatric ROS (+)  Hematology negative hematology ROS (+) anemia ,   Anesthesia Other Findings   Reproductive/Obstetrics negative OB ROS                             Anesthesia Physical Anesthesia Plan  ASA: II  Anesthesia Plan: General   Post-op Pain Management:    Induction:   PONV Risk Score and Plan: Propofol infusion  Airway Management Planned: Mask  Additional Equipment:   Intra-op Plan:   Post-operative Plan:   Informed Consent: I have reviewed the patients History and Physical, chart, labs and discussed the procedure including the risks, benefits and alternatives for the proposed anesthesia with the patient or authorized representative who has indicated his/her understanding and acceptance.       Plan Discussed with:   Anesthesia Plan Comments:         Anesthesia Quick Evaluation

## 2020-11-25 NOTE — H&P (Signed)
History and Physical    Tammy [name] IRC:[id number] DOB: 25-Sep-1976 DOA: 11/25/2020  Referring MD/NP/PA:   PCP: Lin Landsman, MD   Patient coming from:  The patient is coming from home.  At baseline, pt is independent for most of ADL.        Chief Complaint: Hematemesis  HPI: Tammy [name] is a 44 y.o. female with medical history significant of gastric bypass, migraine, anemia, depression, anxiety, anemia, who presents with multiple episodes of hematemesis.  Pt states that she have been taking Aleve for chronic back pain and left shoulder pain.  She developed hematemesis since yesterday morning.  She has had multiple episodes of hematemesis with bright red blood since yesterday morning. This seemed to ease up throughout the day, but recurred overnight and into this morning. She has not noticed any blood in her stool and denies any dark tarry stool. No hx of GIB.  Patient does not have abdominal pain.  No fever or chills.  Denies chest pain, shortness breath, cough.  No lightheadedness.  No symptoms of UTI  ED Course: pt was found to have hemoglobin 14.4 on 05/25/2019 --> 11.8, WBC 9.9, pending COVID-19 PCR, positive FOBT per ED physician, electrolytes renal function okay, temperature normal, blood pressure 137/98, tachycardia with heart rate of 124, 89, RR 16, oxygen saturation 99% on room air.  Patient is placed on MedSurg bed for observation, Dr. Bonna Gains of GI is consulted.   Review of Systems:   General: no fevers, chills, no body weight gain HEENT: no blurry vision, hearing changes or sore throat Respiratory: no dyspnea, coughing, wheezing CV: no chest pain, no palpitations GI: has nausea and hematemesis, no abdominal pain, diarrhea, constipation GU: no dysuria, burning on urination, increased urinary frequency, hematuria  Ext: no leg edema Neuro: no unilateral weakness, numbness, or tingling, no vision change or hearing loss Skin: no rash, no skin tear. MSK: No muscle  spasm, no deformity, no limitation of range of movement in spin Heme: No easy bruising.  Travel history: No recent long distant travel.  Allergy:  Allergies  Allergen Reactions  . Nsaids Other (See Comments)    Had gastric bypass sx- Pt has a sensitivity to drug Pt states that she can take NSAIDS but should use them in moderation    Past Medical History:  Diagnosis Date  . Allergy   . Anxiety   . Blood transfusion without reported diagnosis   . Chronic right shoulder pain   . Fe deficiency anemia   . History of seizure disorder   . Hypertension   . Migraines   . SVD (spontaneous vaginal delivery)    x 5    Past Surgical History:  Procedure Laterality Date  . DILITATION & CURRETTAGE/HYSTROSCOPY WITH HYDROTHERMAL ABLATION  10/11/2012   Procedure: DILATATION & CURETTAGE/HYSTEROSCOPY WITH HYDROTHERMAL ABLATION;  Surgeon: Frederico Hamman, MD;  Location: [facility];  Service: Gynecology;  Laterality: N/A;  . 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 GASTRIC OUTLET OBSTRUCTION RELEASE  2005   post gastric bypass  . TUBAL LIGATION  2002    Social History:  reports that she has never smoked. She has never used smokeless tobacco. She reports current alcohol use. She reports that she does not use drugs.  Family History:  Family History  Problem Relation Age of Onset  . Hypertension Mother   . Hepatitis Mother   . Hyperthyroidism Mother   . Heart disease Mother   . Stroke  Mother   . Moyamoya disease Mother   . Depression Mother   . Obesity Sister   . Hypertension Sister   . Cancer Maternal Grandmother        uterine  . Heart disease Maternal Grandmother   . Hypertension Maternal Grandmother   . Cancer Maternal Grandfather        multiple myeloma  . Hypertension Maternal Grandfather   . Bipolar disorder Father   . Hypertension Father   . Hepatitis Father   . Migraines Daughter      Prior to Admission medications   Medication Sig Start Date End  Date Taking? Authorizing Provider  ALPRAZolam Duanne Moron) 0.5 MG tablet 1 AT MIDDAY AND 2 TABLETS AT BEDTIME 06/26/18   Wardell Honour, MD  amLODipine (NORVASC) 5 MG tablet Take 1 tablet (5 mg total) by mouth daily. 06/26/18   Wardell Honour, MD  Calcium Carbonate-Vit D-Min (CALCIUM 1200 PO) Take 1 tablet by mouth at bedtime.     [provider]  cyanocobalamin (,VITAMIN B-12,) 1000 MCG/ML injection Inject 1 mL (1,000 mcg total) into the muscle every 30 (thirty) days. 04/04/18   Wardell Honour, MD  cyclobenzaprine (FLEXERIL) 5 MG tablet Take 1 tablet (5 mg total) by mouth 3 (three) times daily as needed. 05/28/20   Menshew, Dannielle Karvonen, PA-C  FLUoxetine (PROZAC) 40 MG capsule Take 1 capsule (40 mg total) by mouth daily. 06/26/18   Wardell Honour, MD  folic acid (FOLVITE) 1 MG tablet Take 1 tablet (1 mg total) by mouth daily. 06/26/18   Wardell Honour, MD  lisinopril-hydrochlorothiazide (PRINZIDE,ZESTORETIC) 10-12.5 MG tablet TAKE 1 TABLET BY MOUTH DAILY 06/26/18   Wardell Honour, MD  Multiple Vitamin (MULTIVITAMIN) tablet Take 1 tablet by mouth daily.    [provider]  SUMAtriptan (IMITREX) 50 MG tablet TAKE 50 MG AT THE ONSET OF HEADACHE MAY REPEAT EVERY 2 HOURS BUT NO MORE THAN 200 MG/24 HOURS. 06/27/19   Horald Pollen, MD    Physical Exam: Vitals:   11/25/20 0533 11/25/20 0534 11/25/20 0808  BP:  137/87 (!) 137/98  Pulse:  (!) 124 89  Resp:  18 16  Temp:  98.9 F (37.2 C) 98.6 F (37 C)  TempSrc:   Oral  SpO2:  99% 99%  Weight: 79.4 kg    Height: 5' 7"  (1.702 m)     General: Not in acute distress HEENT:       Eyes: PERRL, EOMI, no scleral icterus.       ENT: No discharge from the ears and nose, no pharynx injection, no tonsillar enlargement.        Neck: No JVD, no bruit, no mass felt. Heme: No neck lymph node enlargement. Cardiac: S1/S2, RRR, No murmurs, No gallops or rubs. Respiratory: No rales, wheezing, rhonchi or rubs. GI: Soft, nondistended,  nontender, no rebound pain, no organomegaly, BS present. GU: No hematuria Ext: No pitting leg edema bilaterally. 1+DP/PT pulse bilaterally. Musculoskeletal: No joint deformities, No joint redness or warmth, no limitation of ROM in spin. Skin: No rashes.  Neuro: Alert, oriented X3, cranial nerves II-XII grossly intact, moves all extremities normally.  Psych: Patient is not psychotic, no suicidal or hemocidal ideation.  Labs on Admission: I have personally reviewed following labs and imaging studies  CBC: Recent Labs  Lab 11/25/20 0545  WBC 9.9  NEUTROABS 7.7  HGB 11.8*  HCT 36.6  MCV 82.4  PLT 476*   Basic Metabolic Panel: Recent Labs  Lab 11/25/20 0545  NA 139  K 4.0  CL 103  CO2 21*  GLUCOSE 110*  BUN 26*  CREATININE 0.65  CALCIUM 9.4   GFR: Estimated Creatinine Clearance: 96.3 mL/min (by C-G formula based on SCr of 0.65 mg/dL). Liver Function Tests: Recent Labs  Lab 11/25/20 0545  AST 11*  ALT 7  ALKPHOS 63  BILITOT 0.8  PROT 8.1  ALBUMIN 4.1   Recent Labs  Lab 11/25/20 0545  LIPASE 27   No results for input(s): AMMONIA in the last 168 hours. Coagulation Profile: No results for input(s): INR, PROTIME in the last 168 hours. Cardiac Enzymes: No results for input(s): CKTOTAL, CKMB, CKMBINDEX, TROPONINI in the last 168 hours. BNP (last 3 results) No results for input(s): PROBNP in the last 8760 hours. HbA1C: No results for input(s): HGBA1C in the last 72 hours. CBG: No results for input(s): GLUCAP in the last 168 hours. Lipid Profile: No results for input(s): CHOL, HDL, LDLCALC, TRIG, CHOLHDL, LDLDIRECT in the last 72 hours. Thyroid Function Tests: No results for input(s): TSH, T4TOTAL, FREET4, T3FREE, THYROIDAB in the last 72 hours. Anemia Panel: No results for input(s): VITAMINB12, FOLATE, FERRITIN, TIBC, IRON, RETICCTPCT in the last 72 hours. Urine analysis:    Component Value Date/Time   COLORURINE YELLOW 05/21/2013 2015   APPEARANCEUR CLEAR  05/21/2013 2015   LABSPEC 1.019 05/21/2013 2015   PHURINE 5.5 05/21/2013 2015   GLUCOSEU NEGATIVE 05/21/2013 2015   HGBUR SMALL (A) 05/21/2013 2015   BILIRUBINUR negative 01/23/2018 1140   KETONESUR negative 01/23/2018 1140   KETONESUR 15 (A) 05/21/2013 2015   PROTEINUR =30 (A) 01/23/2018 1140   PROTEINUR 30 (A) 05/21/2013 2015   UROBILINOGEN 0.2 01/23/2018 1140   UROBILINOGEN 0.2 05/21/2013 2015   NITRITE Negative 01/23/2018 1140   NITRITE NEGATIVE 05/21/2013 2015   LEUKOCYTESUR Negative 01/23/2018 1140   Sepsis Labs: @LABRCNTIP (procalcitonin:4,lacticidven:4) )No results found for this or any previous visit (from the past 240 hour(s)).   Radiological Exams on Admission: No results found.   EKG:  Not done in ED, will get one.   Assessment/Plan Principal Problem:   Hematemesis Active Problems:   Depression with anxiety   HTN (hypertension)   Acute blood loss anemia   Migraine   Chronic back pain   Hematemesis and acute blood loss anemia: hemoglobin 14.4 on 05/25/2019 --> 11.8.  Hemodynamically stable.  Possibly related to Aleve use. Dr. Bonna Gains of GI is consulted.  - will place in med-surg bed obs - GI consulted by Ed, will follow up recommendations - NPO now - IVF: 1L NS bolus, then at 129m/hr - Start IV pantoprazole gtt - Zofran IV for nausea - Avoid NSAIDs and SQ heparin - Maintain IV access (2 large bore IVs if possible). - Monitor closely and follow q6h cbc, transfuse as necessary, if Hgb<7.0 - LaB: INR, PTT and type screen  Depression with anxiety: -Continue home Xanax, Prozac  HTN (hypertension) -IV hydralazine as needed -Continue home amlodipine, lisinopril, HCTZ  Migraine -As needed sumatriptan  Chronic low back pain: -As needed Percocet and Tylenol      DVT ppx: SCD Code Status: Full code Family Communication: not done, no family member is at bed side.    Disposition Plan:  Anticipate discharge back to previous environment Consults  called:  Dr. TBonna Gain[facility] GI Admission status: Med-surg bed for obs   Status is: Observation  The patient remains OBS appropriate and will d/c before 2 midnights.  Dispo: The patient is from: Home  Anticipated d/c is to: Home              Anticipated d/c date is: 1 day              Patient currently is not medically stable to d/c.          Date of Service 11/25/2020    [facility]   If 7PM-7AM, please contact night-coverage [email or web address] 11/25/2020, 8:56 AM

## 2020-11-25 NOTE — ED Triage Notes (Signed)
Patient ambulatory to triage with steady gait, without difficulty or distress noted; pt reports N/V since yesterday; blood noted in emesis; hx gastric bypass surgery in 2005; c/o generalized HA at present

## 2020-11-25 NOTE — Anesthesia Procedure Notes (Signed)
Procedure Name: MAC Date/Time: 11/25/2020 10:53 AM Performed by: Lily Peer, Darren Nodal, CRNA Pre-anesthesia Checklist: Patient identified, Emergency Drugs available, Suction available, Patient being monitored and Timeout performed Patient Re-evaluated:Patient Re-evaluated prior to induction Oxygen Delivery Method: Nasal cannula Induction Type: IV induction

## 2020-11-25 NOTE — Transfer of Care (Signed)
Immediate Anesthesia Transfer of Care Note  Patient: Tammy [name]  Procedure(s) Performed: ESOPHAGOGASTRODUODENOSCOPY (EGD) (N/A )  Patient Location: PACU and Endoscopy Unit  Anesthesia Type:General  Level of Consciousness: drowsy  Airway & Oxygen Therapy: Patient Spontanous Breathing  Post-op Assessment: Report given to RN and Post -op Vital signs reviewed and stable  Post vital signs: Reviewed and stable  Last Vitals:  Vitals Value Taken Time  BP 138/94 11/25/20 1126  Temp    Pulse 124 11/25/20 1126  Resp 37 11/25/20 1126  SpO2 93 % 11/25/20 1126  Vitals shown include unvalidated device data.  Last Pain:  Vitals:   11/25/20 1038  TempSrc: Temporal  PainSc:          Complications: No complications documented.

## 2020-11-25 NOTE — Op Note (Signed)
[facility] Gastroenterology Patient Name: Tammy LinerChandra [name] Procedure Date: 11/25/2020 10:31 AM MRN: [id number] Account #: [id number] Date of Birth: [birth year] Admit Type: Outpatient Age: 44 Room: [facility] END[facility] ROOM 3 Gender: Female Note Status: Finalized Procedure:             Upper GI endoscopy Indications:           Hematemesis Providers:             Jashun Puertas B. Maximino Greenlandahiliani MD, MD Referring MD:          Leilani Ableeese, Betti MD Medicines:             Monitored Anesthesia Care Complications:         No immediate complications. Procedure:             Pre-Anesthesia Assessment:                        - The risks and benefits of the procedure and the                         sedation options and risks were discussed with the                         patient. All questions were answered and informed                         consent was obtained.                        - Patient identification and proposed procedure were                         verified prior to the procedure.                        - ASA Grade Assessment: III - A patient with severe                         systemic disease.                        After obtaining informed consent, the endoscope was                         passed under direct vision. Throughout the procedure,                         the patient's blood pressure, pulse, and oxygen                         saturations were monitored continuously. The Endoscope                         was introduced through the mouth, and advanced to the                         jejunum. The upper GI endoscopy was accomplished with                         ease. The patient tolerated the procedure well.  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      The examined esophagus was normal.      A small hiatal hernia was present.      Evidence of a Roux-en-Y gastrojejunostomy was found. The gastrojejunal       anastomosis  was characterized by ulceration. This was traversed.       Coagulation for bleeding prevention using argon plasma and bipolar probe       was successful.      Two cratered gastric ulcers with pigmented material were found at the       anastomosis. The largest lesion was 10 mm in largest dimension. One       ulcer was clean based and the other had red material at the base. This       was treated as described above. For location marking, one hemostatic       clip was successfully placed. There was no bleeding at the end of the       procedure. Two other previous clips were placed at the site but       immediately fell off while deploying. Only one was left in position for       marking.      The examined jejunum was normal. Impression:            - Ectopic gastric mucosa in the proximal esophagus.                        - Normal esophagus.                        - Small hiatal hernia.                        - Roux-en-Y gastrojejunostomy with gastrojejunal                         anastomosis characterized by ulceration. Treated with                         argon plasma coagulation (APC). Treated with bipolar                         cautery.                        - Gastric ulcers with pigmented material. Clip was                         placed.                        - Normal examined jejunum.                        - No specimens collected. Recommendation:        - Use Protonix (pantoprazole) 40 mg IV BID.                        - Use sucralfate suspension 1 gram PO QID for 6 weeks.                        -  Clear liquid diet today, then advance as tolerated                         to advance diet as tolerated.                        - Would recommend repeat upper endoscopy in 6-8 weeks                         to reassess ulcer site and if luminal narrowing is                         present post ulcer healing, pt may need to be referred                         for evaluation for revision  surgery                        - Return patient to hospital ward for ongoing care.                        - Continue present medications.                        - The findings and recommendations were discussed with                         the patient.                        - Continue Serial CBCs and transfuse PRN                        - Monitor as an inpatient for 24 to 48 hours. If pt                         has recurrent bleeding, please consult vascular                         surgery for possible embolization.                        - Avoid NSAIDs except Aspirin if medically indicated                         by PCP Procedure Code(s):     --- Professional ---                        [phone], Esophagogastroduodenoscopy, flexible,                         transoral; with control of bleeding, any method                        43999, Unlisted procedure, stomach Diagnosis Code(s):     --- Professional ---                        K44.9, Diaphragmatic hernia without obstruction or  gangrene                        Z98.0, Intestinal bypass and anastomosis status                        K25.9, Gastric ulcer, unspecified as acute or chronic,                         without hemorrhage or perforation                        K92.0, Hematemesis CPT copyright 2019 American Medical Association. All rights reserved. The codes documented in this report are preliminary and upon coder review may  be revised to meet current compliance requirements.  Melodie Bouillon, MD Michel Bickers B. Maximino Greenland MD, MD 11/25/2020 11:35:29 AM This report has been signed electronically. Number of Addenda: 0 Note Initiated On: 11/25/2020 10:31 AM Estimated Blood Loss:  Estimated blood loss: none.      [facility]

## 2020-11-25 NOTE — Consult Note (Signed)
Vonda Antigua, MD [address] [place], [place], [place], [address] [address], [place], [place], [place], [address] Phone: [phone]  Fax: [phone]  Consultation  Referring Provider:     Dr. Blaine Hamper Primary Care Physician:  Lin Landsman, MD Reason for Consultation:     Hematemesis  Date of Admission:  11/25/2020 Date of Consultation:  11/25/2020         HPI:   Tammy [name] is a 44 y.o. female with recent Aleve use presents with 1 day history of hematemesis.  Patient reports nausea vomiting that started at 3 AM the night before.  Initially patient did not look at the emesis content, but once she started looking at it, she noted bright red blood streaks with the emesis.  Last episode was early this morning around 5 AM.  No diarrhea.  No melena.  No prior history of similar symptoms.  No prior EGD or colonoscopy.  No family history of GI malignancy.  Past Medical History:  Diagnosis Date  . Allergy   . Anxiety   . Blood transfusion without reported diagnosis   . Chronic right shoulder pain   . Fe deficiency anemia   . History of seizure disorder   . Hypertension   . Migraines   . SVD (spontaneous vaginal delivery)    x 5    Past Surgical History:  Procedure Laterality Date  . DILITATION & CURRETTAGE/HYSTROSCOPY WITH HYDROTHERMAL ABLATION  10/11/2012   Procedure: DILATATION & CURETTAGE/HYSTEROSCOPY WITH HYDROTHERMAL ABLATION;  Surgeon: Frederico Hamman, MD;  Location: [facility];  Service: Gynecology;  Laterality: N/A;  . 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 GASTRIC OUTLET OBSTRUCTION RELEASE  2005   post gastric bypass  . TUBAL LIGATION  2002    Prior to Admission medications   Medication Sig Start Date End Date Taking? Authorizing Provider  ALPRAZolam Duanne Moron) 0.5 MG tablet 1 AT MIDDAY AND 2 TABLETS AT BEDTIME 06/26/18   Wardell Honour, MD  amLODipine (NORVASC) 5 MG tablet Take 1 tablet (5 mg total) by mouth daily. 06/26/18    Wardell Honour, MD  Calcium Carbonate-Vit D-Min (CALCIUM 1200 PO) Take 1 tablet by mouth at bedtime.     [provider]  cyanocobalamin (,VITAMIN B-12,) 1000 MCG/ML injection Inject 1 mL (1,000 mcg total) into the muscle every 30 (thirty) days. 04/04/18   Wardell Honour, MD  cyclobenzaprine (FLEXERIL) 5 MG tablet Take 1 tablet (5 mg total) by mouth 3 (three) times daily as needed. 05/28/20   Menshew, Dannielle Karvonen, PA-C  FLUoxetine (PROZAC) 40 MG capsule Take 1 capsule (40 mg total) by mouth daily. 06/26/18   Wardell Honour, MD  folic acid (FOLVITE) 1 MG tablet Take 1 tablet (1 mg total) by mouth daily. 06/26/18   Wardell Honour, MD  lisinopril-hydrochlorothiazide (PRINZIDE,ZESTORETIC) 10-12.5 MG tablet TAKE 1 TABLET BY MOUTH DAILY 06/26/18   Wardell Honour, MD  Multiple Vitamin (MULTIVITAMIN) tablet Take 1 tablet by mouth daily.    [provider]  SUMAtriptan (IMITREX) 50 MG tablet TAKE 50 MG AT THE ONSET OF HEADACHE MAY REPEAT EVERY 2 HOURS BUT NO MORE THAN 200 MG/24 HOURS. 06/27/19   Horald Pollen, MD    Family History  Problem Relation Age of Onset  . Hypertension Mother   . Hepatitis Mother   . Hyperthyroidism Mother   . Heart disease Mother   . Stroke Mother   . [place]  disease Mother   . Depression Mother   . Obesity Sister   . Hypertension Sister   . Cancer Maternal Grandmother        uterine  . Heart disease Maternal Grandmother   . Hypertension Maternal Grandmother   . Cancer Maternal Grandfather        multiple myeloma  . Hypertension Maternal Grandfather   . Bipolar disorder Father   . Hypertension Father   . Hepatitis Father   . Migraines Daughter      Social History   Tobacco Use  . Smoking status: Never Smoker  . Smokeless tobacco: Never Used  Vaping Use  . Vaping Use: Never used  Substance Use Topics  . Alcohol use: Yes    Comment: rare   . Drug use: No    Allergies as of 11/25/2020 - Review Complete 11/25/2020  Allergen  Reaction Noted  . Nsaids Other (See Comments) 07/23/2012    Review of Systems:    All systems reviewed and negative except where noted in HPI.   Physical Exam:  Vital signs in last 24 hours: Vitals:   11/25/20 0533 11/25/20 0534 11/25/20 0808 11/25/20 1038  BP:  137/87 (!) 137/98 (!) 143/95  Pulse:  (!) 124 89 90  Resp:  _0 Temp:  98.9 F (37.2 C) 98.6 F (37 C) 97.9 F (36.6 C)  TempSrc:   Oral Temporal  SpO2:  99% 99% 100%  Weight: 79.4 kg     Height: _1  (1.702 m)        General:   Pleasant, cooperative in NAD Head:  Normocephalic and atraumatic. Eyes:   No icterus.   Conjunctiva pink. PERRLA. Ears:  Normal auditory acuity. Neck:  Supple; no masses or thyroidomegaly Lungs: Respirations even and unlabored. Lungs clear to auscultation bilaterally.   No wheezes, crackles, or rhonchi.  Abdomen:  Soft, nondistended, nontender. Normal bowel sounds. No appreciable masses or hepatomegaly.  No rebound or guarding.  Neurologic:  Alert and oriented x3;  grossly normal neurologically. Skin:  Intact without significant lesions or rashes. Cervical Nodes:  No significant cervical adenopathy. Psych:  Alert and cooperative. Normal affect.  LAB RESULTS: Recent Labs    11/25/20 0545 11/25/20 0831  WBC 9.9 11.2*  HGB 11.8* 11.4*  HCT 36.6 38.2  PLT 668* 604*   BMET Recent Labs    11/25/20 0545  NA 139  K 4.0  CL 103  CO2 21*  GLUCOSE 110*  BUN 26*  CREATININE 0.65  CALCIUM 9.4   LFT Recent Labs    11/25/20 0545  PROT 8.1  ALBUMIN 4.1  AST 11*  ALT 7  ALKPHOS 63  BILITOT 0.8   PT/INR Recent Labs    11/25/20 0831  LABPROT 12.6  INR 1.0    STUDIES: No results found.    Impression / Plan:   Tammy [name] is a 44 y.o. y/o female with hematemesis for 1 day in the setting of NSAID use  Patient is hemodynamically stable Proceed with EGD today for evaluation of source of hematemesis Peptic ulcer disease versus esophagitis versus gastritis  versus AVMs  Most likely differential is peptic ulcer disease from NSAID use  PPI IV twice daily  Continue serial CBCs and transfuse PRN Avoid NSAIDs Maintain 2 large-bore IV lines Please page GI with any acute hemodynamic changes, or signs of active GI bleeding  Patient was also informed that she is age where [place] screening is recommended.  This would need to  be done on an elective basis as an outpatient.  Patient's PCP is in [place].  I have asked her to discuss referral for screening colonoscopy with a local gastroenterologist upon discharge and she verbalized understanding  Thank you for involving me in the care of this patient.      LOS: 0 days   Virgel Manifold, MD  11/25/2020, 10:40 AM

## 2020-11-26 ENCOUNTER — Encounter: Payer: Self-pay | Admitting: Gastroenterology

## 2020-11-26 DIAGNOSIS — K250 Acute gastric ulcer with hemorrhage: Secondary | ICD-10-CM | POA: Diagnosis not present

## 2020-11-26 DIAGNOSIS — Z823 Family history of stroke: Secondary | ICD-10-CM | POA: Diagnosis not present

## 2020-11-26 DIAGNOSIS — I10 Essential (primary) hypertension: Secondary | ICD-10-CM

## 2020-11-26 DIAGNOSIS — G43909 Migraine, unspecified, not intractable, without status migrainosus: Secondary | ICD-10-CM | POA: Diagnosis present

## 2020-11-26 DIAGNOSIS — G8929 Other chronic pain: Secondary | ICD-10-CM | POA: Diagnosis present

## 2020-11-26 DIAGNOSIS — Z818 Family history of other mental and behavioral disorders: Secondary | ICD-10-CM | POA: Diagnosis not present

## 2020-11-26 DIAGNOSIS — Z20822 Contact with and (suspected) exposure to covid-19: Secondary | ICD-10-CM | POA: Diagnosis present

## 2020-11-26 DIAGNOSIS — D519 Vitamin B12 deficiency anemia, unspecified: Secondary | ICD-10-CM | POA: Diagnosis not present

## 2020-11-26 DIAGNOSIS — Z980 Intestinal bypass and anastomosis status: Secondary | ICD-10-CM | POA: Diagnosis not present

## 2020-11-26 DIAGNOSIS — Z79899 Other long term (current) drug therapy: Secondary | ICD-10-CM | POA: Diagnosis not present

## 2020-11-26 DIAGNOSIS — Z807 Family history of other malignant neoplasms of lymphoid, hematopoietic and related tissues: Secondary | ICD-10-CM | POA: Diagnosis not present

## 2020-11-26 DIAGNOSIS — D509 Iron deficiency anemia, unspecified: Secondary | ICD-10-CM | POA: Diagnosis not present

## 2020-11-26 DIAGNOSIS — F418 Other specified anxiety disorders: Secondary | ICD-10-CM | POA: Diagnosis present

## 2020-11-26 DIAGNOSIS — G40909 Epilepsy, unspecified, not intractable, without status epilepticus: Secondary | ICD-10-CM | POA: Diagnosis present

## 2020-11-26 DIAGNOSIS — K920 Hematemesis: Secondary | ICD-10-CM | POA: Diagnosis not present

## 2020-11-26 DIAGNOSIS — D62 Acute posthemorrhagic anemia: Secondary | ICD-10-CM | POA: Diagnosis not present

## 2020-11-26 DIAGNOSIS — E538 Deficiency of other specified B group vitamins: Secondary | ICD-10-CM | POA: Diagnosis present

## 2020-11-26 DIAGNOSIS — Z8249 Family history of ischemic heart disease and other diseases of the circulatory system: Secondary | ICD-10-CM | POA: Diagnosis not present

## 2020-11-26 DIAGNOSIS — K259 Gastric ulcer, unspecified as acute or chronic, without hemorrhage or perforation: Secondary | ICD-10-CM | POA: Diagnosis present

## 2020-11-26 DIAGNOSIS — M549 Dorsalgia, unspecified: Secondary | ICD-10-CM | POA: Diagnosis present

## 2020-11-26 DIAGNOSIS — Z886 Allergy status to analgesic agent status: Secondary | ICD-10-CM | POA: Diagnosis not present

## 2020-11-26 DIAGNOSIS — K449 Diaphragmatic hernia without obstruction or gangrene: Secondary | ICD-10-CM | POA: Diagnosis present

## 2020-11-26 LAB — BASIC METABOLIC PANEL
Anion gap: 10 (ref 5–15)
BUN: 17 mg/dL (ref 6–20)
CO2: 21 mmol/L — ABNORMAL LOW (ref 22–32)
Calcium: 8.9 mg/dL (ref 8.9–10.3)
Chloride: 108 mmol/L (ref 98–111)
Creatinine, Ser: 0.6 mg/dL (ref 0.44–1.00)
GFR, Estimated: >60 mL/min (ref >60)
Glucose, Bld: 134 mg/dL — ABNORMAL HIGH (ref 70–99)
Potassium: 4.3 mmol/L (ref 3.5–5.1)
Sodium: 139 mmol/L (ref 135–145)

## 2020-11-26 LAB — CBC
HCT: 30.7 % — ABNORMAL LOW (ref 36.0–46.0)
HCT: 31.4 % — ABNORMAL LOW (ref 36.0–46.0)
Hemoglobin: 9.7 g/dL — ABNORMAL LOW (ref 12.0–15.0)
Hemoglobin: 10.1 g/dL — ABNORMAL LOW (ref 12.0–15.0)
MCH: 26.9 pg (ref 26.0–34.0)
MCH: 27.2 pg (ref 26.0–34.0)
MCHC: 31.6 g/dL (ref 30.0–36.0)
MCHC: 32.2 g/dL (ref 30.0–36.0)
MCV: 85 fL (ref 80.0–100.0)
MCV: 84.4 fL (ref 80.0–100.0)
Platelets: 526 10*3/uL — ABNORMAL HIGH (ref 150–400)
Platelets: 546 10*3/uL — ABNORMAL HIGH (ref 150–400)
RBC: 3.61 MIL/uL — ABNORMAL LOW (ref 3.87–5.11)
RBC: 3.72 MIL/uL — ABNORMAL LOW (ref 3.87–5.11)
RDW: 14.7 % (ref 11.5–15.5)
RDW: 14.9 % (ref 11.5–15.5)
WBC: 19.6 10*3/uL — ABNORMAL HIGH (ref 4.0–10.5)
WBC: 17.9 10*3/uL — ABNORMAL HIGH (ref 4.0–10.5)
nRBC: 0 % (ref 0.0–0.2)
nRBC: 0 % (ref 0.0–0.2)

## 2020-11-26 LAB — VITAMIN B12: Vitamin B-12: 275 pg/mL (ref 180–914)

## 2020-11-26 LAB — FERRITIN: Ferritin: 9 ng/mL — ABNORMAL LOW (ref 11–307)

## 2020-11-26 LAB — TYPE AND SCREEN
ABO/RH(D): B POS
Antibody Screen: NEGATIVE

## 2020-11-26 MED ORDER — ACETAMINOPHEN 325 MG PO TABS: 650.0000 mg | ORAL_TABLET | Freq: Four times a day (QID) | ORAL | Status: DC | PRN

## 2020-11-26 MED ORDER — SODIUM CHLORIDE 0.9 % IV SOLN
400.0000 mg | Freq: Once | INTRAVENOUS | Status: AC
  Administered 2020-11-26: 400 mg via INTRAVENOUS
  Filled 2020-11-26: qty 20

## 2020-11-26 NOTE — Progress Notes (Signed)
Patient ID: Tammy [name], female   DOB: 1976/07/26, 44 y.o.   MRN: [id number] [facility] PROGRESS NOTE  Tammy [name] TDH:[id number] DOB: 22-Apr-1976 DOA: 11/25/2020 PCP: Leilani Able, MD  HPI/Subjective: Patient feeling better today than yesterday.  Had a lot of vomiting even last night after the endoscopy.  Tolerated liquid diet this morning for breakfast and for lunch.  Willing to try solid food later today.  No blood in the bowel movements.  Objective: Vitals:   11/26/20 0826 11/26/20 1105  BP: (!) 154/93 (!) 154/95  Pulse: 87 98  Resp: 16 16  Temp: 99.3 F (37.4 C) 98.5 F (36.9 C)  SpO2: 100% 100%    Intake/Output Summary (Last 24 hours) at 11/26/2020 1339 Last data filed at 11/26/2020 1108 Gross per 24 hour  Intake 429.93 ml  Output 1000 ml  Net -570.07 ml   Filed Weights   11/25/20 0533  Weight: 79.4 kg    ROS: Review of Systems  Respiratory: Negative for shortness of breath.   Cardiovascular: Negative for chest pain.  Gastrointestinal: Negative for abdominal pain, nausea and vomiting.   Exam: Physical Exam HENT:     Head: Normocephalic.     Mouth/Throat:     Pharynx: No oropharyngeal exudate.  Eyes:     General: Lids are normal.     Conjunctiva/sclera: Conjunctivae normal.     Pupils: Pupils are equal, round, and reactive to light.  Cardiovascular:     Rate and Rhythm: Normal rate and regular rhythm.     Heart sounds: Normal heart sounds, S1 normal and S2 normal.  Pulmonary:     Breath sounds: No decreased breath sounds, wheezing or rhonchi.  Abdominal:     Palpations: Abdomen is soft.     Tenderness: There is no abdominal tenderness.  Musculoskeletal:     Right lower leg: No swelling.     Left lower leg: No swelling.  Skin:    General: Skin is warm.     Findings: No lesion.  Neurological:     Mental Status: She is alert and oriented to person, place, and time.       Data Reviewed: Basic Metabolic Panel: Recent Labs  Lab  11/25/20 0545 11/26/20 0211  NA 139 139  K 4.0 4.3  CL 103 108  CO2 21* 21*  GLUCOSE 110* 134*  BUN 26* 17  CREATININE 0.65 0.60  CALCIUM 9.4 8.9   Liver Function Tests: Recent Labs  Lab 11/25/20 0545  AST 11*  ALT 7  ALKPHOS 63  BILITOT 0.8  PROT 8.1  ALBUMIN 4.1   Recent Labs  Lab 11/25/20 0545  LIPASE 27   CBC: Recent Labs  Lab 11/25/20 0545 11/25/20 0831 11/25/20 1433 11/25/20 2020 11/26/20 0211 11/26/20 0816  WBC 9.9 11.2* 14.1* 22.6* 19.6* 17.9*  NEUTROABS 7.7  --   --   --   --   --   HGB 11.8* 11.4* 10.5* 10.9* 9.7* 10.1*  HCT 36.6 38.2 33.6* 33.7* 30.7* 31.4*  MCV 82.4 87.2 85.9 84.3 85.0 84.4  PLT 668* 604* 571* 568* 526* 546*     Recent Results (from the past 240 hour(s))  Resp Panel by RT-PCR (Flu A&B, Covid) Nasopharyngeal Swab     Status: None   Collection Time: 11/25/20  8:31 AM   Specimen: Nasopharyngeal Swab; Nasopharyngeal(NP) swabs in vial transport medium  Result Value Ref Range Status   SARS Coronavirus 2 by RT PCR NEGATIVE NEGATIVE Final    Comment: (NOTE)  SARS-CoV-2 target nucleic acids are NOT DETECTED.  The SARS-CoV-2 RNA is generally detectable in upper respiratory specimens during the acute phase of infection. The lowest concentration of SARS-CoV-2 viral copies this assay can detect is 138 copies/mL. A negative result does not preclude SARS-Cov-2 infection and should not be used as the sole basis for treatment or other patient management decisions. A negative result may occur with  improper specimen collection/handling, submission of specimen other than nasopharyngeal swab, presence of viral mutation(s) within the areas targeted by this assay, and inadequate number of viral copies(<138 copies/mL). A negative result must be combined with clinical observations, patient history, and epidemiological information. The expected result is Negative.  Fact Sheet for Patients:  BloggerCourse.com  Fact Sheet  for Healthcare Providers:  SeriousBroker.it  This test is no t yet approved or cleared by the [place] FDA and  has been authorized for detection and/or diagnosis of SARS-CoV-2 by FDA under an Emergency Use Authorization (EUA). This EUA will remain  in effect (meaning this test can be used) for the duration of the COVID-19 declaration under Section 564(b)(1) of the Act, 21 U.S.C.section 360bbb-3(b)(1), unless the authorization is terminated  or revoked sooner.       Influenza A by PCR NEGATIVE NEGATIVE Final   Influenza B by PCR NEGATIVE NEGATIVE Final    Comment: (NOTE) The Xpert Xpress SARS-CoV-2/FLU/RSV plus assay is intended as an aid in the diagnosis of influenza from Nasopharyngeal swab specimens and should not be used as a sole basis for treatment. Nasal washings and aspirates are unacceptable for Xpert Xpress SARS-CoV-2/FLU/RSV testing.  Fact Sheet for Patients: BloggerCourse.com  Fact Sheet for Healthcare Providers: SeriousBroker.it  This test is not yet approved or cleared by the [place] FDA and has been authorized for detection and/or diagnosis of SARS-CoV-2 by FDA under an Emergency Use Authorization (EUA). This EUA will remain in effect (meaning this test can be used) for the duration of the COVID-19 declaration under Section 564(b)(1) of the Act, 21 U.S.C. section 360bbb-3(b)(1), unless the authorization is terminated or revoked.  Performed at [facility], [address] [place], [place] [address]       Scheduled Meds: . amLODipine  10 mg Oral Daily  . cyclobenzaprine  10 mg Oral TID  . FLUoxetine  40 mg Oral Daily  . gabapentin  300 mg Oral TID  . hydrochlorothiazide  25 mg Oral Daily  . lisinopril  40 mg Oral Daily  . ondansetron (ZOFRAN) IV  4 mg Intravenous Once  . [START ON 11/28/2020] pantoprazole  40 mg Intravenous Q12H  . sucralfate  1 g Oral TID WC  & HS   Continuous Infusions: . iron sucrose    . pantoprozole (PROTONIX) infusion 8 mg/hr (11/26/20 1209)    Assessment/Plan:  1. Gastric ulcers and anastomosis ulcer with hemorrhage.  1 ulcer needed argon coagulation and the other 1 needed a clip.  Tolerated liquid diet today.  Case discussed with gastroenterology decubitus to soft diet later today.  Will need GI follow-up as outpatient. 2. Iron deficiency anemia we will give iron IV today. 3. Essential hypertension on amlodipine and lisinopril HCT     Code Status:     Code Status Orders  (From admission, onward)         Start     Ordered   11/25/20 0846  Full code  Continuous        11/25/20 0845        Code Status History  Date Active Date Inactive Code Status Order ID Comments User Context   01/25/2018 1048 01/26/2018 1629 Full Code [id number]  Russella Dar, NP ED   08/31/2012 1727 09/01/2012 2049 Full Code [id number]  Sheldon Silvan, RN Inpatient   Advance Care Planning Activity     Family Communication: Deferred Disposition Plan: Status is: Inpatient  Dispo: The patient is from: Home              Anticipated d/c is to: Home              Anticipated d/c date is: 11/27/2020              Patient currently slowly advancing diet after found to have 2 gastric ulcers and anastomosis ulcer.  We will give IV iron today.  Time spent: 28 minutes  Jevaughn [employer]oll[employer]

## 2020-11-26 NOTE — Plan of Care (Signed)
°  Problem: Clinical Measurements: °Goal: Ability to maintain clinical measurements within normal limits will improve °Outcome: Progressing °Goal: Will remain free from infection °Outcome: Progressing °Goal: Diagnostic test results will improve °Outcome: Progressing °Goal: Respiratory complications will improve °Outcome: Progressing °  °

## 2020-11-27 DIAGNOSIS — D519 Vitamin B12 deficiency anemia, unspecified: Secondary | ICD-10-CM

## 2020-11-27 LAB — CBC
HCT: 34.7 % — ABNORMAL LOW (ref 36.0–46.0)
Hemoglobin: 11.2 g/dL — ABNORMAL LOW (ref 12.0–15.0)
MCH: 27.1 pg (ref 26.0–34.0)
MCHC: 32.3 g/dL (ref 30.0–36.0)
MCV: 83.8 fL (ref 80.0–100.0)
Platelets: 542 10*3/uL — ABNORMAL HIGH (ref 150–400)
RBC: 4.14 MIL/uL (ref 3.87–5.11)
RDW: 14.9 % (ref 11.5–15.5)
WBC: 11.1 10*3/uL — ABNORMAL HIGH (ref 4.0–10.5)
nRBC: 0 % (ref 0.0–0.2)

## 2020-11-27 MED ORDER — SUCRALFATE 1 GM/10ML PO SUSP: 1.0000 g | Freq: Three times a day (TID) | ORAL | 0 refills | Status: AC

## 2020-11-27 MED ORDER — PANTOPRAZOLE SODIUM 40 MG PO TBEC: 40.0000 mg | DELAYED_RELEASE_TABLET | Freq: Two times a day (BID) | ORAL | 0 refills | Status: AC

## 2020-11-27 MED ORDER — ONDANSETRON HCL 4 MG PO TABS: 4.0000 mg | ORAL_TABLET | Freq: Three times a day (TID) | ORAL | 0 refills | Status: AC | PRN

## 2020-11-27 MED ORDER — PANTOPRAZOLE SODIUM 40 MG PO TBEC
40.0000 mg | DELAYED_RELEASE_TABLET | Freq: Two times a day (BID) | ORAL | Status: DC
  Administered 2020-11-27: 40 mg via ORAL
  Filled 2020-11-27: qty 1

## 2020-11-27 NOTE — Plan of Care (Signed)

## 2020-11-27 NOTE — Plan of Care (Signed)
  Problem: Education: Goal: Knowledge of General Education information will improve Description: Including pain rating scale, medication(s)/side effects and non-pharmacologic comfort measures 11/27/2020 1023 by Ansel Bong, RN Outcome: Progressing 11/27/2020 1023 by Ansel Bong, RN Outcome: Progressing   Problem: Health Behavior/Discharge Planning: Goal: Ability to manage health-related needs will improve 11/27/2020 1023 by Ansel Bong, RN Outcome: Progressing 11/27/2020 1023 by Ansel Bong, RN Outcome: Progressing   Problem: Clinical Measurements: Goal: Ability to maintain clinical measurements within normal limits will improve 11/27/2020 1023 by Ansel Bong, RN Outcome: Progressing 11/27/2020 1023 by Ansel Bong, RN Outcome: Progressing Goal: Will remain free from infection 11/27/2020 1023 by Ansel Bong, RN Outcome: Progressing 11/27/2020 1023 by Ansel Bong, RN Outcome: Progressing Goal: Diagnostic test results will improve 11/27/2020 1023 by Ansel Bong, RN Outcome: Progressing 11/27/2020 1023 by Ansel Bong, RN Outcome: Progressing Goal: Respiratory complications will improve 11/27/2020 1023 by Ansel Bong, RN Outcome: Progressing 11/27/2020 1023 by Ansel Bong, RN Outcome: Progressing Goal: Cardiovascular complication will be avoided 11/27/2020 1023 by Ansel Bong, RN Outcome: Progressing 11/27/2020 1023 by Ansel Bong, RN Outcome: Progressing   Problem: Activity: Goal: Risk for activity intolerance will decrease 11/27/2020 1023 by Ansel Bong, RN Outcome: Progressing 11/27/2020 1023 by Ansel Bong, RN Outcome: Progressing   Problem: Nutrition: Goal: Adequate nutrition will be maintained 11/27/2020 1023 by Ansel Bong, RN Outcome: Progressing 11/27/2020 1023 by Ansel Bong, RN Outcome: Progressing   Problem: Coping: Goal: Level of anxiety will decrease 11/27/2020 1023 by Ansel Bong, RN Outcome:  Progressing 11/27/2020 1023 by Ansel Bong, RN Outcome: Progressing   Problem: Elimination: Goal: Will not experience complications related to bowel motility 11/27/2020 1023 by Ansel Bong, RN Outcome: Progressing 11/27/2020 1023 by Ansel Bong, RN Outcome: Progressing Goal: Will not experience complications related to urinary retention 11/27/2020 1023 by Ansel Bong, RN Outcome: Progressing 11/27/2020 1023 by Ansel Bong, RN Outcome: Progressing   Problem: Pain Managment: Goal: General experience of comfort will improve 11/27/2020 1023 by Ansel Bong, RN Outcome: Progressing 11/27/2020 1023 by Ansel Bong, RN Outcome: Progressing   Problem: Safety: Goal: Ability to remain free from injury will improve 11/27/2020 1023 by Ansel Bong, RN Outcome: Progressing 11/27/2020 1023 by Ansel Bong, RN Outcome: Progressing   Problem: Skin Integrity: Goal: Risk for impaired skin integrity will decrease 11/27/2020 1023 by Ansel Bong, RN Outcome: Progressing 11/27/2020 1023 by Ansel Bong, RN Outcome: Progressing

## 2020-11-27 NOTE — Progress Notes (Signed)
Melodie Bouillon, MD [address], [address], [place], [place], [address] [address], [address], [place], [place], [address] Phone: [phone]  Fax: [phone]   Subjective: Patient denies any further episodes of bleeding.  No abdominal pain.  No nausea or vomiting.  Tolerating solid diet without difficulty   Objective: Exam: Vital signs in last 24 hours: Vitals:   11/27/20 0336 11/27/20 0555 11/27/20 0746 11/27/20 1153  BP: (!) 162/112 (!) 127/97 125/90 134/89  Pulse: 89 89 95 95  Resp: 16  16 16   Temp: 98.6 F (37 C)  99 F (37.2 C) 99 F (37.2 C)  TempSrc: Oral     SpO2: 100%  99% 99%  Weight:      Height:       Weight change:   Intake/Output Summary (Last 24 hours) at 11/27/2020 1319 Last data filed at 11/27/2020 0900 Gross per 24 hour  Intake 932.28 ml  Output 0 ml  Net 932.28 ml    General: No acute distress, AAO x3 Abd: Soft, NT/ND, No HSM Skin: Warm, no rashes Neck: Supple, Trachea midline   Lab Results: Lab Results  Component Value Date   WBC 11.1 (H) 11/27/2020   HGB 11.2 (L) 11/27/2020   HCT 34.7 (L) 11/27/2020   MCV 83.8 11/27/2020   PLT 542 (H) 11/27/2020   Micro Results: Recent Results (from the past 240 hour(s))  Resp Panel by RT-PCR (Flu A&B, Covid) Nasopharyngeal Swab     Status: None   Collection Time: 11/25/20  8:31 AM   Specimen: Nasopharyngeal Swab; Nasopharyngeal(NP) swabs in vial transport medium  Result Value Ref Range Status   SARS Coronavirus 2 by RT PCR NEGATIVE NEGATIVE Final    Comment: (NOTE) SARS-CoV-2 target nucleic acids are NOT DETECTED.  The SARS-CoV-2 RNA is generally detectable in upper respiratory specimens during the acute phase of infection. The lowest concentration of SARS-CoV-2 viral copies this assay can detect is 138 copies/mL. A negative result does not preclude SARS-Cov-2 infection and should not be used as the sole basis for treatment or other patient management decisions. A negative result  may occur with  improper specimen collection/handling, submission of specimen other than nasopharyngeal swab, presence of viral mutation(s) within the areas targeted by this assay, and inadequate number of viral copies(<138 copies/mL). A negative result must be combined with clinical observations, patient history, and epidemiological information. The expected result is Negative.  Fact Sheet for Patients:  11/27/20  Fact Sheet for Healthcare Providers:  BloggerCourse.com  This test is no t yet approved or cleared by the [place] FDA and  has been authorized for detection and/or diagnosis of SARS-CoV-2 by FDA under an Emergency Use Authorization (EUA). This EUA will remain  in effect (meaning this test can be used) for the duration of the COVID-19 declaration under Section 564(b)(1) of the Act, 21 U.S.C.section 360bbb-3(b)(1), unless the authorization is terminated  or revoked sooner.       Influenza A by PCR NEGATIVE NEGATIVE Final   Influenza B by PCR NEGATIVE NEGATIVE Final    Comment: (NOTE) The Xpert Xpress SARS-CoV-2/FLU/RSV plus assay is intended as an aid in the diagnosis of influenza from Nasopharyngeal swab specimens and should not be used as a sole basis for treatment. Nasal washings and aspirates are unacceptable for Xpert Xpress SARS-CoV-2/FLU/RSV testing.  Fact Sheet for Patients: [place]  Fact Sheet for Healthcare Providers: BloggerCourse.com  This test is not yet approved or cleared by the [employer]ker.[place] and has been authorized  for detection and/or diagnosis of SARS-CoV-2 by FDA under an Emergency Use Authorization (EUA). This EUA will remain in effect (meaning this test can be used) for the duration of the COVID-19 declaration under Section 564(b)(1) of the Act, 21 U.S.C. section 360bbb-3(b)(1), unless the authorization is terminated  or revoked.  Performed at [facility], [address], [place], [place] [address]    Studies/Results: No results found. Medications:  Scheduled Meds: . amLODipine  10 mg Oral Daily  . cyclobenzaprine  10 mg Oral TID  . FLUoxetine  40 mg Oral Daily  . gabapentin  300 mg Oral TID  . hydrochlorothiazide  25 mg Oral Daily  . lisinopril  40 mg Oral Daily  . ondansetron (ZOFRAN) IV  4 mg Intravenous Once  . pantoprazole  40 mg Oral BID  . sucralfate  1 g Oral TID WC & HS   Continuous Infusions: PRN Meds:.acetaminophen, ALPRAZolam, hydrALAZINE, ondansetron (ZOFRAN) IV, oxyCODONE-acetaminophen, SUMAtriptan   Assessment: Principal Problem:   Hematemesis Active Problems:   Depression with anxiety   Essential hypertension   Acute blood loss anemia   Migraine   Chronic back pain   Intestinal bypass or anastomosis status   Acute gastric ulcer with hemorrhage   Gastric ulcer    Plan: Continue PPI twice daily for 8 to 12 weeks Continue Carafate Patient advised to follow-up in GI clinic in 3 to 4 weeks to reassess symptoms and discuss repeat endoscopy in the near future She is agreeable with above plan Please page GI with any signs of active GI bleeding GI service will sign off at this time   LOS: 1 day   Melodie Bouillon, MD 11/27/2020, 1:19 PM

## 2020-11-27 NOTE — Progress Notes (Signed)
This RN delivered discharge instructions and teaching to the patient. The patient verbalized and demonstrated understanding of the provided instructions. All outstanding questions resolved. R Arm PIV removed. Cannula intact. Pt tolerated well. All belongings packed and in tow. Pt ambulates independently and declined transport. Pt to ambulate to medical mall with family member to discharge via private vehicle.

## 2020-11-27 NOTE — Discharge Summary (Signed)
[facility] -  at [facility]   PATIENT NAME: Tammy [name]    MR#:  [id number]  DATE OF BIRTH:  04-Jun-1976  DATE OF ADMISSION:  11/25/2020 ADMITTING PHYSICIAN: Alford Highland, MD  DATE OF DISCHARGE: 11/27/2020  PRIMARY CARE PHYSICIAN: Leilani Able, MD    ADMISSION DIAGNOSIS:  Hematemesis [K92.0] Upper GI bleed [K92.2] Hematemesis with nausea [K92.0] Gastric ulcer [K25.9]  DISCHARGE DIAGNOSIS:  Principal Problem:   Hematemesis Active Problems:   Depression with anxiety   Essential hypertension   Acute blood loss anemia   Migraine   Chronic back pain   Intestinal bypass or anastomosis status   Acute gastric ulcer with hemorrhage   Gastric ulcer   SECONDARY DIAGNOSIS:   Past Medical History:  Diagnosis Date  . Allergy   . Anxiety   . Blood transfusion without reported diagnosis   . Chronic right shoulder pain   . Fe deficiency anemia   . History of seizure disorder   . Hypertension   . Migraines   . SVD (spontaneous vaginal delivery)    x 5    HOSPITAL COURSE:   1.  Gastric ulcers and anastomosis ulcer with hemorrhage.  Patient had an endoscopy by Dr. Maximino Greenland on 11/25/2020.  1 ulcer needed argon coagulation the other ulcer needed to clip.  Patient did not do too well last night with the soft diet.  She will try again today.  Switched off IV Protonix drip over to Protonix orally twice a day.  As per GI will need 8 to 12 weeks of twice daily PPI and continue on Carafate.  Will need an outpatient endoscopy in about 4 weeks. 2.  Iron deficiency anemia.  I gave the patient IV iron here in the hospital.  Patient states that she can follow-up with her hematologist in [facility] for iron infusions 3.  B12 deficiency B12 lower normal range.  Patient states that she gets monthly B12 injections 4.  Essential hypertension on Norvasc, lisinopril and hydrochlorothiazide.  DISCHARGE CONDITIONS:  Satisfactory  CONSULTS OBTAINED:   Gastroenterology  DRUG ALLERGIES:   Allergies  Allergen Reactions  . Nsaids Other (See Comments)    Had gastric bypass sx- Pt has a sensitivity to drug Pt states that she can take NSAIDS but should use them in moderation    DISCHARGE MEDICATIONS:   Allergies as of 11/27/2020      Reactions   Nsaids Other (See Comments)   Had gastric bypass sx- Pt has a sensitivity to drug Pt states that she can take NSAIDS but should use them in moderation      Medication List    TAKE these medications   ALPRAZolam 0.5 MG tablet Commonly known as: XANAX 1 AT MIDDAY AND 2 TABLETS AT BEDTIME What changed:  how much to take how to take this when to take this reasons to take this   amLODipine 10 MG tablet Commonly known as: NORVASC Take 10 mg by mouth daily.   cyanocobalamin 1000 MCG/ML injection Commonly known as: (VITAMIN B-12) Inject 1 mL (1,000 mcg total) into the muscle every 30 (thirty) days.   cyclobenzaprine 10 MG tablet Commonly known as: FLEXERIL Take by mouth 3 (three) times daily.   FLUoxetine 40 MG capsule Commonly known as: PROZAC Take 1 capsule (40 mg total) by mouth daily.   gabapentin 300 MG capsule Commonly known as: NEURONTIN Take 300 mg by mouth 3 (three) times daily.   hydrochlorothiazide 25 MG tablet Commonly known as: HYDRODIURIL Take 25 mg  by mouth daily.   lisinopril 40 MG tablet Commonly known as: ZESTRIL Take 40 mg by mouth daily.   ondansetron 4 MG tablet Commonly known as: Zofran Take 1 tablet (4 mg total) by mouth every 8 (eight) hours as needed for nausea or vomiting.   pantoprazole 40 MG tablet Commonly known as: PROTONIX Take 1 tablet (40 mg total) by mouth 2 (two) times daily.   sucralfate 1 GM/10ML suspension Commonly known as: CARAFATE Take 10 mLs (1 g total) by mouth 4 (four) times daily -  with meals and at bedtime.   SUMAtriptan 50 MG tablet Commonly known as: IMITREX TAKE 50 MG AT THE ONSET OF HEADACHE MAY REPEAT EVERY 2  HOURS BUT NO MORE THAN 200 MG/24 HOURS. What changed: See the new instructions.        DISCHARGE INSTRUCTIONS:   Follow-up PCP 5 days Follow-up gastroenterology 2 to 3 weeks  If you experience worsening of your admission symptoms, develop shortness of breath, life threatening emergency, suicidal or homicidal thoughts you must seek medical attention immediately by calling 911 or calling your MD immediately  if symptoms less severe.  You Must read complete instructions/literature along with all the possible adverse reactions/side effects for all the Medicines you take and that have been prescribed to you. Take any new Medicines after you have completely understood and accept all the possible adverse reactions/side effects.   Please note  You were cared for by a hospitalist during your hospital stay. If you have any questions about your discharge medications or the care you received while you were in the hospital after you are discharged, you can call the unit and asked to speak with the hospitalist on call if the hospitalist that took care of you is not available. Once you are discharged, your primary care physician will handle any further medical issues. Please note that NO REFILLS for any discharge medications will be authorized once you are discharged, as it is imperative that you return to your primary care physician (or establish a relationship with a primary care physician if you do not have one) for your aftercare needs so that they can reassess your need for medications and monitor your lab values.    Today   CHIEF COMPLAINT:   Chief Complaint  Patient presents with  . Emesis    HISTORY OF PRESENT ILLNESS:  Tammy [name]  is a 44 y.o. female came in with vomiting   VITAL SIGNS:  Blood pressure 134/89, pulse 95, temperature 99 F (37.2 C), resp. rate 16, height 5\' 7"  (1.702 m), weight 79.4 kg, SpO2 99 %.  I/O:    Intake/Output Summary (Last 24 hours) at 11/27/2020  1330 Last data filed at 11/27/2020 0900 Gross per 24 hour  Intake 932.28 ml  Output 0 ml  Net 932.28 ml    PHYSICAL EXAMINATION:  GENERAL:  44 y.o.-year-old patient lying in the bed with no acute distress.  EYES: Pupils equal, round, reactive to light and accommodation. No scleral icterus. Extraocular muscles intact.  HEENT: Head atraumatic, normocephalic. Oropharynx and nasopharynx clear.  LUNGS: Normal breath sounds bilaterally, no wheezing, rales,rhonchi or crepitation. No use of accessory muscles of respiration.  CARDIOVASCULAR: S1, S2 normal. No murmurs, rubs, or gallops.  ABDOMEN: Soft, non-tender, non-distended. Bowel sounds present. No organomegaly or mass.  EXTREMITIES: No pedal edema.  NEUROLOGIC: Cranial nerves II through XII are intact. Muscle strength 5/5 in all extremities. Sensation intact. Gait not checked.  PSYCHIATRIC: The patient is alert and oriented  x 3.  SKIN: No obvious rash, lesion, or ulcer.   DATA REVIEW:   CBC Recent Labs  Lab 11/27/20 0613  WBC 11.1*  HGB 11.2*  HCT 34.7*  PLT 542*    Chemistries  Recent Labs  Lab 11/25/20 0545 11/26/20 0211  NA 139 139  K 4.0 4.3  CL 103 108  CO2 21* 21*  GLUCOSE 110* 134*  BUN 26* 17  CREATININE 0.65 0.60  CALCIUM 9.4 8.9  AST 11*  --   ALT 7  --   ALKPHOS 63  --   BILITOT 0.8  --      Microbiology Results  Results for orders placed or performed during the hospital encounter of 11/25/20  Resp Panel by RT-PCR (Flu A&B, Covid) Nasopharyngeal Swab     Status: None   Collection Time: 11/25/20  8:31 AM   Specimen: Nasopharyngeal Swab; Nasopharyngeal(NP) swabs in vial transport medium  Result Value Ref Range Status   SARS Coronavirus 2 by RT PCR NEGATIVE NEGATIVE Final    Comment: (NOTE) SARS-CoV-2 target nucleic acids are NOT DETECTED.  The SARS-CoV-2 RNA is generally detectable in upper respiratory specimens during the acute phase of infection. The lowest concentration of SARS-CoV-2 viral  copies this assay can detect is 138 copies/mL. A negative result does not preclude SARS-Cov-2 infection and should not be used as the sole basis for treatment or other patient management decisions. A negative result may occur with  improper specimen collection/handling, submission of specimen other than nasopharyngeal swab, presence of viral mutation(s) within the areas targeted by this assay, and inadequate number of viral copies(<138 copies/mL). A negative result must be combined with clinical observations, patient history, and epidemiological information. The expected result is Negative.  Fact Sheet for Patients:  BloggerCourse.comhttps://www.fda.gov/media/152166/download  Fact Sheet for Healthcare Providers:  SeriousBroker.ithttps://www.fda.gov/media/152162/download  This test is no t yet approved or cleared by the [place] FDA and  has been authorized for detection and/or diagnosis of SARS-CoV-2 by FDA under an Emergency Use Authorization (EUA). This EUA will remain  in effect (meaning this test can be used) for the duration of the COVID-19 declaration under Section 564(b)(1) of the Act, 21 U.S.C.section 360bbb-3(b)(1), unless the authorization is terminated  or revoked sooner.       Influenza A by PCR NEGATIVE NEGATIVE Final   Influenza B by PCR NEGATIVE NEGATIVE Final    Comment: (NOTE) The Xpert Xpress SARS-CoV-2/FLU/RSV plus assay is intended as an aid in the diagnosis of influenza from Nasopharyngeal swab specimens and should not be used as a sole basis for treatment. Nasal washings and aspirates are unacceptable for Xpert Xpress SARS-CoV-2/FLU/RSV testing.  Fact Sheet for Patients: BloggerCourse.comhttps://www.fda.gov/media/152166/download  Fact Sheet for Healthcare Providers: SeriousBroker.ithttps://www.fda.gov/media/152162/download  This test is not yet approved or cleared by the [place] FDA and has been authorized for detection and/or diagnosis of SARS-CoV-2 by FDA under an Emergency Use Authorization (EUA). This  EUA will remain in effect (meaning this test can be used) for the duration of the COVID-19 declaration under Section 564(b)(1) of the Act, 21 U.S.C. section 360bbb-3(b)(1), unless the authorization is terminated or revoked.  Performed at [facility], [address] [place], [place] [address]      Management plans discussed with the patient, and she is in agreement.  CODE STATUS:     Code Status Orders  (From admission, onward)         Start     Ordered   11/25/20 0846  Full code  Continuous        11/25/20 0845        Code Status History    Date Active Date Inactive Code Status Order ID Comments User Context   01/25/2018 1048 01/26/2018 1629 Full Code [id number]  Russella Dar, NP ED   08/31/2012 1727 09/01/2012 2049 Full Code [id number]  Sheldon Silvan, RN Inpatient   Advance Care Planning Activity      TOTAL TIME TAKING CARE OF THIS PATIENT: 35 minutes.    Alford Highland M.D on 11/27/2020 at 1:30 PM  Between 7am to 6pm - Pager - [phone]  After 6pm go to [email or web address] - password [email or web address]  [facility]  CC: Primary care physician; Leilani Able, MD

## 2020-12-11 ENCOUNTER — Telehealth: Payer: Self-pay

## 2020-12-11 NOTE — Telephone Encounter (Signed)
Called patient but had to leave her a voicemail letting her know that Dr. Maximino Greenland had done an EGD on her on 11/25/2020 and she wanted her to repeat it in 6-8 weeks from procedure date to check on her gastric ulcer. Awaiting on patient's response.   EGD (02/01-15/2022) Diagnosis: Gastric ulcer K25.9

## 2020-12-12 ENCOUNTER — Other Ambulatory Visit: Payer: Self-pay

## 2020-12-12 DIAGNOSIS — K253 Acute gastric ulcer without hemorrhage or perforation: Secondary | ICD-10-CM

## 2020-12-12 NOTE — Telephone Encounter (Signed)
Returned patients call to schedule follow up EGD.  LVM for her to call the office back.  EGD Dx: Gastric Ulcer K25.9  Marcelino Duster, CMA

## 2020-12-23 ENCOUNTER — Ambulatory Visit: Payer: Federal, State, Local not specified - PPO | Admitting: Gastroenterology

## 2020-12-23 ENCOUNTER — Other Ambulatory Visit: Payer: Self-pay

## 2021-01-16 ENCOUNTER — Other Ambulatory Visit: Admission: RE | Admit: 2021-01-16 | Payer: Federal, State, Local not specified - PPO | Source: Ambulatory Visit

## 2021-01-20 ENCOUNTER — Encounter: Admission: RE | Payer: Self-pay | Source: Home / Self Care

## 2021-01-20 ENCOUNTER — Telehealth: Payer: Self-pay

## 2021-01-20 ENCOUNTER — Ambulatory Visit
Admission: RE | Admit: 2021-01-20 | Payer: Federal, State, Local not specified - PPO | Source: Home / Self Care | Admitting: Gastroenterology

## 2021-01-20 SURGERY — COLONOSCOPY WITH PROPOFOL
Anesthesia: General

## 2021-01-20 NOTE — Telephone Encounter (Signed)
Patient  Of Dr. Michele Mcalpine, lvm yesterday to reschedule her EGD that was scheduled for today.  Stated that she would like to reschedule due to her husband having a medical appt today.  Please call to reschedule.  Thanks,  [place], [place]

## 2021-01-27 NOTE — Telephone Encounter (Signed)
Called and left a message for call back  

## 2022-11-10 ENCOUNTER — Encounter: Payer: Self-pay | Admitting: Hematology and Oncology

## 2022-12-27 ENCOUNTER — Encounter: Payer: Self-pay | Admitting: Hematology and Oncology

## 2023-02-07 ENCOUNTER — Encounter: Payer: Self-pay | Admitting: Hematology and Oncology
